# Patient Record
Sex: Female | Born: 1985 | Race: White | Hispanic: No | Marital: Single | Smoking: Never smoker
Health system: Southern US, Community
[De-identification: ages and names within clinical notes are randomized; demographics above are authoritative.]

## PROBLEM LIST (undated history)

## (undated) DIAGNOSIS — K589 Irritable bowel syndrome without diarrhea: Secondary | ICD-10-CM

## (undated) DIAGNOSIS — J45909 Unspecified asthma, uncomplicated: Secondary | ICD-10-CM

## (undated) DIAGNOSIS — F419 Anxiety disorder, unspecified: Secondary | ICD-10-CM

## (undated) DIAGNOSIS — G43909 Migraine, unspecified, not intractable, without status migrainosus: Secondary | ICD-10-CM

## (undated) DIAGNOSIS — Z8619 Personal history of other infectious and parasitic diseases: Secondary | ICD-10-CM

## (undated) HISTORY — DX: Anxiety disorder, unspecified: F41.9

## (undated) HISTORY — DX: Unspecified asthma, uncomplicated: J45.909

## (undated) HISTORY — DX: Irritable bowel syndrome, unspecified: K58.9

## (undated) HISTORY — DX: Personal history of other infectious and parasitic diseases: Z86.19

## (undated) HISTORY — DX: Migraine, unspecified, not intractable, without status migrainosus: G43.909

---
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
## 2017-09-03 ENCOUNTER — Telehealth: Payer: Self-pay | Admitting: Physician Assistant

## 2017-09-03 ENCOUNTER — Encounter: Payer: Self-pay | Admitting: Physician Assistant

## 2017-09-03 ENCOUNTER — Ambulatory Visit (INDEPENDENT_AMBULATORY_CARE_PROVIDER_SITE_OTHER): Payer: 59 | Admitting: Physician Assistant

## 2017-09-03 VITALS — BP 130/82 | HR 89 | Temp 98.6°F | Ht 68.25 in | Wt 252.5 lb

## 2017-09-03 DIAGNOSIS — Z23 Encounter for immunization: Secondary | ICD-10-CM | POA: Diagnosis not present

## 2017-09-03 DIAGNOSIS — Z114 Encounter for screening for human immunodeficiency virus [HIV]: Secondary | ICD-10-CM | POA: Diagnosis not present

## 2017-09-03 DIAGNOSIS — G43819 Other migraine, intractable, without status migrainosus: Secondary | ICD-10-CM | POA: Diagnosis not present

## 2017-09-03 LAB — COMPREHENSIVE METABOLIC PANEL
ALK PHOS: 95 U/L (ref 39–117)
ALT: 17 U/L (ref 0–35)
AST: 12 U/L (ref 0–37)
Albumin: 4.6 g/dL (ref 3.5–5.2)
BILIRUBIN TOTAL: 0.4 mg/dL (ref 0.2–1.2)
BUN: 6 mg/dL (ref 6–23)
CO2: 23 meq/L (ref 19–32)
Calcium: 10.1 mg/dL (ref 8.4–10.5)
Chloride: 107 mEq/L (ref 96–112)
Creatinine, Ser: 0.56 mg/dL (ref 0.40–1.20)
GFR: 136.11 mL/min (ref >60.00)
GLUCOSE: 84 mg/dL (ref 70–99)
POTASSIUM: 4.5 meq/L (ref 3.5–5.1)
SODIUM: 138 meq/L (ref 135–145)
TOTAL PROTEIN: 6.9 g/dL (ref 6.0–8.3)

## 2017-09-03 LAB — CBC WITH DIFFERENTIAL/PLATELET
Basophils Absolute: 0.1 10*3/uL (ref 0.0–0.1)
Basophils Relative: 0.5 % (ref 0.0–3.0)
Eosinophils Absolute: 0.1 10*3/uL (ref 0.0–0.7)
Eosinophils Relative: 0.9 % (ref 0.0–5.0)
HCT: 41.6 % (ref 36.0–46.0)
Hemoglobin: 13.4 g/dL (ref 12.0–15.0)
LYMPHS ABS: 4.1 10*3/uL — AB (ref 0.7–4.0)
Lymphocytes Relative: 34.7 % (ref 12.0–46.0)
MCHC: 32.2 g/dL (ref 30.0–36.0)
MCV: 88.3 fl (ref 78.0–100.0)
MONO ABS: 0.8 10*3/uL (ref 0.1–1.0)
Monocytes Relative: 6.5 % (ref 3.0–12.0)
NEUTROS PCT: 57.4 % (ref 43.0–77.0)
Neutro Abs: 6.7 10*3/uL (ref 1.4–7.7)
PLATELETS: 445 10*3/uL — AB (ref 150.0–400.0)
RBC: 4.71 Mil/uL (ref 3.87–5.11)
RDW: 13.4 % (ref 11.5–15.5)
WBC: 11.7 10*3/uL — ABNORMAL HIGH (ref 4.0–10.5)

## 2017-09-03 LAB — TSH: TSH: 2.45 u[IU]/mL (ref 0.35–4.50)

## 2017-09-03 MED ORDER — KETOROLAC TROMETHAMINE 10 MG PO TABS: 10.0000 mg | ORAL_TABLET | Freq: Four times a day (QID) | ORAL | 0 refills | Status: DC | PRN

## 2017-09-03 NOTE — Telephone Encounter (Signed)
Per referral documentation: [place] [facility] Called pt to schedule and pt refused appointment, wanted something sooner. Would you like [place] to try another office?

## 2017-09-03 NOTE — Progress Notes (Signed)
Miranda Hunter is a 31 y.o. female here to Establish Care and discuss chronic migraines.  I acted as a Neurosurgeon for [employer], PA-C Corky Mull, LPN  History of Present Illness:   Chief Complaint  Patient presents with  . Establish Care    [employer]  . Chronic Migraines    Acute Concerns: Migraines -- currently, she has been dealing with a migraine for 8 days. 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 Now having 3-4 clusters of headaches throughout the day. Typically gets 1 HA per month, prior to period. She is not sexually active.   Has taken Imitrex --> didn't work well for her, knocked her out Has taken Butalbital --> makes her very moody and temperamental Has never been on a daily medication to prevent migraines  When she was younger she tried to avoid medications -- per her mother's discretion. Worked on diet elimination but this was not helpful in determining cause of headaches. Headaches typically come and go around her period. Has been occurring since age 46. 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 [facility] -- did [facility]feedback without relief. Recommended to do, but did not do acupuncture. Her ob-gyn put her on depo-provera, and this has been helping, however she has been on this for a total of at least 3+ years and her ob-gyn is taking her off of it soon. Tried Nexplanon for two years but discontinued due to continuous headache and bleeding. Was also on birth control pills, but this didn't provide sufficient control.  Has never had any imaging of her head. Gets numbness/tingling in fingers in toes. Pain is consistently on L side this time, but typically on R side.   Health Maintenance: *Has been several years since last physical or any routine labwork per her report Weight -- Weight: 252 lb 8 oz (114.5 kg)    Depression screen PHQ 2/9 09/03/2017  Decreased Interest 0  Down, Depressed, Hopeless 0  PHQ  - 2 Score 0    No flowsheet data found.  Other providers/specialists: Huel Cote -- ob-gyn Kalman Jewels -- dentist  Past Medical History:  Diagnosis Date  . History of chicken pox   . Migraines      Social History   Social History  . Marital status: Single    Spouse name: N/A  . Number of children: N/A  . Years of education: N/A   Occupational History  . Not on file.   Social History Main Topics  . Smoking status: Never Smoker  . Smokeless tobacco: Never Used  . Alcohol use Yes     Comment: socially  . Drug use: No  . Sexual activity: Not Currently    Birth control/ protection: Injection   Other Topics Concern  . Not on file   Social History Narrative   She is a Psychiatric nurseSam"   Recently got a new puppy    Past Surgical History:  Procedure Laterality Date  . tonsillectomy and adnoidectomy Bilateral 2007    Family History  Problem Relation Age of Onset  . Prostate cancer Father   . Asthma Sister   . Lung cancer Maternal Grandmother        heavy smoker  . Lung cancer Maternal Grandfather        heavy smoker  . Asthma Sister     Allergies  Allergen Reactions  . Amoxicillin-Pot Clavulanate Diarrhea  Current Medications:   Current Outpatient Prescriptions:  .  Butalbital-APAP-Caffeine 50-300-40 MG CAPS, Take 1 tablet by mouth every 4 (four) hours as needed. , Disp: , Rfl:  .  cetirizine (ZYRTEC) 10 MG tablet, Take 10 mg by mouth daily., Disp: , Rfl:  .  Flaxseed, Linseed, (FLAX SEEDS PO), Take 1 tablet by mouth at bedtime., Disp: , Rfl:  .  medroxyPROGESTERone (DEPO-PROVERA) 150 MG/ML injection, Inject 150 mg into the muscle every 3 (three) months. , Disp: , Rfl:  .  Multiple Vitamin (MULTIVITAMIN) tablet, Take 1 tablet by mouth daily., Disp: , Rfl:  .  ondansetron (ZOFRAN) 8 MG tablet, Take 8 mg by mouth every 8 (eight) hours as needed. , Disp: , Rfl:  .  ketorolac (TORADOL) 10 MG tablet, Take 1 tablet (10 mg total) by mouth every 6  (six) hours as needed., Disp: 20 tablet, Rfl: 0   Review of Systems:   Review of Systems  Constitutional: Negative.   HENT: Negative.   Eyes: Positive for blurred vision.  Respiratory: Negative for cough and shortness of breath.   Cardiovascular: Negative.  Negative for chest pain, palpitations and leg swelling.  Gastrointestinal: Negative.   Genitourinary: Negative.   Musculoskeletal: Negative.   Skin: Negative.   Neurological: Positive for tingling and headaches. Negative for sensory change, speech change and focal weakness.       Numbness and tingling finger tips toes and tongue.  Endo/Heme/Allergies: Negative.   Psychiatric/Behavioral: Negative.     Vitals:   Vitals:   09/03/17 0852  BP: 130/82  Pulse: 89  Temp: 98.6 F (37 C)  TempSrc: Oral  SpO2: 96%  Weight: 252 lb 8 oz (114.5 kg)  Height: 5' 8.25" (1.734 m)     Body mass index is 38.11 kg/m.  Physical Exam:   Physical Exam  Constitutional: She appears well-developed. She is cooperative.  Non-toxic appearance. She does not have a sickly appearance. She does not appear ill. No distress.  HENT:  Head: Normocephalic and atraumatic.  Right Ear: Tympanic membrane, external ear and ear canal normal. Tympanic membrane is not erythematous, not retracted and not bulging.  Left Ear: Tympanic membrane, external ear and ear canal normal. Tympanic membrane is not erythematous, not retracted and not bulging.  Nose: Nose normal. Right sinus exhibits no maxillary sinus tenderness and no frontal sinus tenderness. Left sinus exhibits no maxillary sinus tenderness and no frontal sinus tenderness.  Mouth/Throat: Uvula is midline. No posterior oropharyngeal edema or posterior oropharyngeal erythema.  Eyes: Conjunctivae and lids are normal.  Neck: Trachea normal.  Cardiovascular: Normal rate, regular rhythm, S1 normal, S2 normal and normal heart sounds.   Pulmonary/Chest: Effort normal and breath sounds normal. She has no decreased  breath sounds. She has no wheezes. She has no rhonchi. She has no rales.  Lymphadenopathy:    She has no cervical adenopathy.  Neurological: She is alert. She has normal strength and normal reflexes. No cranial nerve deficit or sensory deficit. Coordination and gait normal. GCS eye subscore is 4. GCS verbal subscore is 5. GCS motor subscore is 6.  Reflex Scores:      Patellar reflexes are 2+ on the right side and 2+ on the left side. Normal finger-to-nose exam and rapid alternating movements  Skin: Skin is warm, dry and intact.  Psychiatric: She has a normal mood and affect. Her speech is normal and behavior is normal.  Nursing note and vitals reviewed.   Assessment and Plan:    Jacinta was seen  today for establish care and chronic migraines.  Diagnoses and all orders for this visit:  Other migraine without status migrainosus, intractable Discussed case with Dr. Helane Rima. Given complex history of uncontrolled migraines x at least 1 decade, will send to neurology for further evaluation. Imaging management per neuro's discretion. Will obtain labs. Provided prn Toradol tablets for patient to take. Reviewed stroke precautions and red flags that would warrant ER evaluation. No red flags on today's exam. Patient verbalized understanding. -     CBC with Differential/Platelet -     Comprehensive metabolic panel -     TSH -     Ambulatory referral to Neurology  Need for prophylactic vaccination and inoculation against influenza -     Flu Vaccine QUAD 36+ mos IM  Encounter for screening for HIV -     HIV antibody  Other orders -     ketorolac (TORADOL) 10 MG tablet; Take 1 tablet (10 mg total) by mouth every 6 (six) hours as needed.    . Reviewed expectations re: course of current medical issues. . Discussed self-management of symptoms. . Outlined signs and symptoms indicating need for more acute intervention. . Patient verbalized understanding and all questions were  answered. . See orders for this visit as documented in the electronic medical record. . Patient received an After-Visit Summary.   CMA or LPN served as scribe during this visit. History, Physical, and Plan performed by medical provider. Documentation and orders reviewed and attested to.   Jarold Motto, PA-C

## 2017-09-03 NOTE — Patient Instructions (Signed)
It was great to meet you!  You may take the Toradol pills to help with your pain. You may start taking this evening.  We are referring you to neurology for further evaluation, I would like for them to determine if you need any imaging (MRI, etc.)  We will call you with your lab results.   Contact a health care provider if:  Your symptoms are not helped by medicine.  You have a headache that is different from the usual headache.  You have nausea or you vomit.  You have a fever.  Get help right away if:  Your headache becomes severe.  You have repeated vomiting.  You have a stiff neck.  You have a loss of vision.  You have problems with speech.  You have pain in the eye or ear.  You have muscular weakness or loss of muscle control.  You lose your balance or have trouble walking.  You feel faint or pass out.  You have confusion.  This information is not intended to replace advice given to you by your health care provider. Make sure you discuss any questions you have with your health care provider.

## 2017-09-03 NOTE — Telephone Encounter (Signed)
[facility]  Patient has requested this.  Thanks for all of your help.  Jarold Motto, PA-C

## 2017-09-03 NOTE — Telephone Encounter (Signed)
Patient calling to see if referral can sent to [facility].  Please call back once sent.  Ty,  -LL

## 2017-09-03 NOTE — Telephone Encounter (Signed)
Can we try [facility]?  Jarold Motto PA-C, 09/03/17

## 2017-09-04 LAB — HIV ANTIBODY (ROUTINE TESTING W REFLEX): HIV: NONREACTIVE

## 2017-09-11 ENCOUNTER — Telehealth: Payer: Self-pay | Admitting: Physician Assistant

## 2017-09-11 NOTE — Telephone Encounter (Signed)
ROI faxed to [facility]  °

## 2017-11-03 ENCOUNTER — Telehealth: Payer: Self-pay | Admitting: *Deleted

## 2017-11-03 NOTE — Telephone Encounter (Signed)
Called and spoke with patient. She verbalized understanding that office will be closed tomorrow, Monday 12/10 and we will back ASAP to reschedule appointment.

## 2017-11-04 ENCOUNTER — Ambulatory Visit: Payer: 59 | Admitting: Neurology

## 2017-11-06 NOTE — Telephone Encounter (Signed)
Called and spoke with patient. I rescheduled her for this coming Friday 11/08/17 @ 3:30 with an arrival time of 3:00. She verbalized understanding and appreciation.

## 2017-11-07 NOTE — Progress Notes (Signed)
[facility]    Provider:  Dr Lucia Gaskins Referring Provider: Jarold Motto, PA Primary Care Physician:  Jarold Motto, PA  CC:  Migraines  HPI:  Miranda Hunter is a 31 y.o. female here as a referral from Dr. Bufford Buttner for migraines. Mother is here and provides information. She started having migraines in HS and then worsened as a freshman in college. [facility] and [facility]. No known triggers. Managing her menstruation helped. In October her migraine lasted 9 days. She was vomiting, was so painful, 10/10 pain and this was her tipping point. She has had no auras but also has an aura, starts on the left side, throbbing, pulsating, light/sound/smell sensitivity, dark room better, movement makes it worse, her whole body hurts. Extensive family history of migraines in mother, grandmother. She has numbness in the toes and tongue, no weakness. Every 3 months she gets a migraine and it is predictable. No other focal neurologic deficits, associated symptoms, inciting events or modifiable factors.  Meds tried: propranolol, Topiramate, nortriptyline  Reviewed notes, labs and imaging from outside physicians, which showed:  Reviewed referring physician's notes, patient was last seen in October 2018 and she had been dealing with a migraine for 8 days, 3-4 clusters of migraines throughout the day, in the past she typically would get one headache per month prior to.,  Not sexually active, Imitrex did not work and gave her side effects, butalbital makes her very moody and temperamental, has never been on a preventative medication.  In the past she worked on diet elimination but this was not helpful, headaches would typically come and go around her.  Periods started at the age of 53, worsened as a freshman in college.  [facility] did [facility] without relief.  Did not do acupuncture.  Her OB/GYN put her on Depo-Provera  which has helped for the last 3 years.  Tried Nexplanon for 2 years but discontinued due to continuous headache and bleeding.  Never had imaging of her brain.  TSH, CMP, HIV, CBC all unremarkable only abnormality with slightly elevated white blood cells 11.7.  And elevated platelets 445.  Review of Systems: Patient complains of symptoms per HPI as well as the following symptoms: headache. Pertinent negatives and positives per HPI. All others negative.   Social History   Socioeconomic History  . Marital status: Single    Spouse name: Not on file  . Number of children: 0  . Years of education: Not on file  . Highest education level: Bachelor's degree (e.g., BA, AB, BS)  Social Needs  . Financial resource strain: Not on file  . Food insecurity - worry: Not on file  . Food insecurity - inability: Not on file  . Transportation needs - medical: Not on file  . Transportation needs - non-medical: Not on file  Occupational History  . Not on file  Tobacco Use  . Smoking status: Never Smoker  . Smokeless tobacco: Never Used  Substance and Sexual Activity  . Alcohol use: Yes    Comment: socially  . Drug use: No  . Sexual activity: Not Currently    Birth control/protection: Injection  Other Topics Concern  . Not on file  Social History Narrative   She is a Psychiatric nurseSam"   Recently got a new puppy- a golden retriever named [name]   Caffeine once a week       Family History  Problem Relation Age of  Onset  . Prostate cancer Father   . Asthma Sister   . Lung cancer Maternal Grandmother        heavy smoker  . Lung cancer Maternal Grandfather        heavy smoker  . Asthma Sister     Past Medical History:  Diagnosis Date  . History of chicken pox   . Migraines     Past Surgical History:  Procedure Laterality Date  . tonsillectomy and adnoidectomy Bilateral 2007    Current Outpatient Medications  Medication Sig Dispense Refill  . Butalbital-APAP-Caffeine 50-300-40 MG  CAPS Take 1 tablet by mouth every 4 (four) hours as needed.     . cetirizine (ZYRTEC) 10 MG tablet Take 10 mg by mouth daily.    . Flaxseed, Linseed, (FLAX SEEDS PO) Take 1 tablet by mouth at bedtime.    Marland Kitchen. ketorolac (TORADOL) 10 MG tablet Take 1 tablet (10 mg total) by mouth every 6 (six) hours as needed. 20 tablet 11  . medroxyPROGESTERone (DEPO-PROVERA) 150 MG/ML injection Inject 150 mg into the muscle every 3 (three) months.     . Multiple Vitamin (MULTIVITAMIN) tablet Take 1 tablet by mouth daily.    . ondansetron (ZOFRAN) 8 MG tablet Take 8 mg by mouth every 8 (eight) hours as needed.     . Diclofenac Potassium 50 MG PACK Take 50 mg by mouth once as needed. Take once daily as needed with headache onset. Please take with food 9 each 0  . frovatriptan (FROVA) 2.5 MG tablet Take 1 tablet (2.5 mg total) by mouth as needed for migraine. If recurs, may repeat after 2 hours. Max of 2 tabs in 24 hours. 10 tablet 11  . ondansetron (ZOFRAN-ODT) 4 MG disintegrating tablet Take 1 tablet (4 mg total) by mouth every 8 (eight) hours as needed for nausea. 60 tablet 11   No current facility-administered medications for this visit.     Allergies as of 11/08/2017 - Review Complete 11/08/2017  Allergen Reaction Noted  . Amoxicillin-pot clavulanate Diarrhea 12/04/2016    Vitals: BP 134/90 (BP Location: Right Arm, Patient Position: Sitting)   Pulse (!) 111   Ht 5\' 9"  (1.753 m)   Wt 254 lb 9.6 oz (115.5 kg)   BMI 37.60 kg/m  Last Weight:  Wt Readings from Last 1 Encounters:  11/08/17 254 lb 9.6 oz (115.5 kg)   Last Height:   Ht Readings from Last 1 Encounters:  11/08/17 5\' 9"  (1.753 m)   Physical exam: Exam: Gen: NAD, conversant, well nourised, obese, well groomed                     CV: RRR, no MRG. No Carotid Bruits. No peripheral edema, warm, nontender Eyes: Conjunctivae clear without exudates or hemorrhage  Neuro: Detailed Neurologic Exam  Speech:    Speech is normal; fluent and  spontaneous with normal comprehension.  Cognition:    The patient is oriented to person, place, and time;     recent and remote memory intact;     language fluent;     normal attention, concentration,     fund of knowledge Cranial Nerves:    The pupils are equal, round, and reactive to light. The fundi are normal and spontaneous venous pulsations are present. Visual fields are full to finger confrontation. Extraocular movements are intact. Trigeminal sensation is intact and the muscles of mastication are normal. The face is symmetric. The palate elevates in the midline. Hearing intact. Voice is  normal. Shoulder shrug is normal. The tongue has normal motion without fasciculations.   Coordination:    Normal finger to nose and heel to shin. Normal rapid alternating movements.   Gait:    Heel-toe and tandem gait are normal.   Motor Observation:    No asymmetry, no atrophy, and no involuntary movements noted. Tone:    Normal muscle tone.    Posture:    Posture is normal. normal erect    Strength:    Strength is V/V in the upper and lower limbs.      Sensation: intact to LT     Reflex Exam:  DTR's:    Deep tendon reflexes in the upper and lower extremities are normal bilaterally.   Toes:    The toes are downgoing bilaterally.   Clonus:    Clonus is absent.    Assessment/Plan:  31 year old with predictable migraine. Discussed acute medication management.   At onset of migraine for acute management: Cambia or Toradol and may try Frova For prevention if migraine is predictable: Frova within 12-24 hours of migraines  Discussed: To prevent or relieve headaches, try the following: Cool Compress. Lie down and place a cool compress on your head.  Avoid headache triggers. If certain foods or odors seem to have triggered your migraines in the past, avoid them. A headache diary might help you identify triggers.  Include physical activity in your daily routine. Try a daily walk or other  moderate aerobic exercise.  Manage stress. Find healthy ways to cope with the stressors, such as delegating tasks on your to-do list.  Practice relaxation techniques. Try deep breathing, yoga, massage and visualization.  Eat regularly. Eating regularly scheduled meals and maintaining a healthy diet might help prevent headaches. Also, drink plenty of fluids.  Follow a regular sleep schedule. Sleep deprivation might contribute to headaches Consider biofeedback. With this mind-body technique, you learn to control certain bodily functions - such as muscle tension, heart rate and blood pressure - to prevent headaches or reduce headache pain.    Proceed to emergency room if you experience new or worsening symptoms or symptoms do not resolve, if you have new neurologic symptoms or if headache is severe, or for any concerning symptom.   Provided education and documentation from American headache Society toolbox including articles on: chronic migraine medication overuse headache, chronic migraines, prevention of migraines, behavioral and other nonpharmacologic treatments for headache.  Cc: Jarold MottoWorley, [place], [place]   Naomie DeanAntonia Leta Bucklin, MD  [facility] [address] [place], [place] [id number]  Phone [phone] Fax [phone]

## 2017-11-08 ENCOUNTER — Encounter: Payer: Self-pay | Admitting: Neurology

## 2017-11-08 ENCOUNTER — Ambulatory Visit: Payer: 59 | Admitting: Neurology

## 2017-11-08 VITALS — BP 134/90 | HR 111 | Ht 69.0 in | Wt 254.6 lb

## 2017-11-08 DIAGNOSIS — G43109 Migraine with aura, not intractable, without status migrainosus: Secondary | ICD-10-CM | POA: Diagnosis not present

## 2017-11-08 MED ORDER — FROVATRIPTAN SUCCINATE 2.5 MG PO TABS: 2.5000 mg | ORAL_TABLET | ORAL | 11 refills | Status: DC | PRN

## 2017-11-08 MED ORDER — DICLOFENAC POTASSIUM(MIGRAINE) 50 MG PO PACK: 50.0000 mg | PACK | Freq: Once | ORAL | 0 refills | Status: DC | PRN

## 2017-11-08 MED ORDER — KETOROLAC TROMETHAMINE 10 MG PO TABS: 10.0000 mg | ORAL_TABLET | Freq: Four times a day (QID) | ORAL | 11 refills | Status: DC | PRN

## 2017-11-08 MED ORDER — ONDANSETRON 4 MG PO TBDP: 4.0000 mg | ORAL_TABLET | Freq: Three times a day (TID) | ORAL | 11 refills | Status: DC | PRN

## 2017-11-08 NOTE — Patient Instructions (Addendum)
Magnesium citrate 400mg  to 600mg  daily, riboflavin 400mg  daily, Coenzyme Q 10 100mg  three times daily Consider joining Facebook group [facility]. Look up Aimovig or Ajovy (CGRP medications)  Frovatriptan: 1-2 times daily in anticipation of migraine dures menses, may take with zofran and Cambia or other NSAID Ondansetron prn for nausea or migraine Can take Frova with NSAID and Zofran   Frovatriptan tablets What is this medicine? FROVATRIPTAN (froe va TRIP tan) is used to treat migraines with or without aura. An aura is a strange feeling or visual disturbance that warns you of an attack. It is not used to prevent migraines. This medicine may be used for other purposes; ask your health care provider or pharmacist if you have questions. COMMON BRAND NAME(S): Frova What should I tell my health care provider before I take this medicine? They need to know if you have any of these conditions: -bowel disease or colitis -diabetes -family history of heart disease -fast or irregular heart beat -heart or blood vessel disease, angina (chest pain), or previous heart attack -high blood pressure -high cholesterol -history of stroke, transient ischemic attacks (TIAs or mini-strokes), or intracranial bleeding -kidney or liver disease -overweight -poor circulation -postmenopausal or surgical removal of uterus and ovaries -Raynaud's disease -seizure disorder -an unusual or allergic reaction to frovatriptan, other medicines, foods, dyes, or preservatives -pregnant or trying to get pregnant -breast-feeding How should I use this medicine? Take this medicine by mouth with a glass of water. Follow the directions on the prescription label. This medicine is taken at the first symptoms of a migraine. It is not for everyday use. If your migraine headache returns after one dose, you can take another dose as directed. You must allow at least 2 hours between doses, and do not take more than 3  tablets (7.5 mg) in 24 hours. If there is no improvement at all after the first dose, do not take a second dose without talking to your doctor or health care professional. Do not take your medicine more often than directed. Talk to your pediatrician regarding the use of this medicine in children. Special care may be needed. Overdosage: If you think you have taken too much of this medicine contact a poison control center or emergency room at once. NOTE: This medicine is only for you. Do not share this medicine with others. What if I miss a dose? This does not apply; this medicine is not for regular use. What may interact with this medicine? Do not take this medicine with any of the following medicines: -amphetamine, dextroamphetamine or cocaine -dihydroergotamine, ergotamine, ergoloid mesylates, methysergide, or ergot-type medication - do not take within 24 hours of taking frovatriptan -feverfew -MAOIs like Carbex, Eldepryl, Marplan, Nardil, and Parnate - do not take frovatriptan within 2 weeks of stopping MAOI therapy -other migraine medicines like almotriptan, eletriptan, naratriptan, rizatriptan, zolmitriptan - do not take within 24 hours of taking frovatriptan -tryptophan This medicine may also interact with the following medications: -female hormones, like estrogens or progestins and birth control pills -medicines for mental depression, anxiety or mood problems -propranolol This list may not describe all possible interactions. Give your health care provider a list of all the medicines, herbs, non-prescription drugs, or dietary supplements you use. Also tell them if you smoke, drink alcohol, or use illegal drugs. Some items may interact with your medicine. What should I watch for while using this medicine? Only take this medicine for a migraine headache. Take it if you get warning symptoms or  at the start of a migraine attack. It is not for regular use to prevent migraine attacks. You may get  drowsy or dizzy. Do not drive, use machinery, or do anything that needs mental alertness until you know how this medicine affects you. To reduce dizzy or fainting spells, do not sit or stand up quickly, especially if you are an older patient. Alcohol can increase drowsiness, dizziness and flushing. Avoid alcoholic drinks. Smoking cigarettes may increase the risk of heart-related side effects from using this medicine. If you take migraine medicines for 10 or more days a month, your migraines may get worse. Keep a diary of headache days and medicine use. Contact your healthcare professional if your migraine attacks occur more frequently. What side effects may I notice from receiving this medicine? Side effects that you should report to your doctor or health care professional as soon as possible: -allergic reactions like skin rash, itching or hives, swelling of the face, lips, or tongue -chest or throat pain, tightness -fast, slow, or irregular heart beat -increased or decreased blood pressure -loss of vision or vision changes -seizures -severe stomach pain and cramping, bloody diarrhea -shortness of breath, wheezing, or difficulty breathing -tingling, pain, or numbness in the face, hands or feet Side effects that usually do not require medical attention (report to your doctor or health care professional if they continue or are bothersome): -drowsiness -feeling warm, flushing, or redness of the face -headache -muscle pain or cramps -nausea, vomiting, diarrhea or stomach upset -tiredness or weakness This list may not describe all possible side effects. Call your doctor for medical advice about side effects. You may report side effects to FDA at 1-800-FDA-1088. Where should I keep my medicine? Keep out of the reach of children. Store at room temperature between 20 and 25 degrees C (68 and 77 degrees F). Protect from light and moisture. Throw away any unused medicine after the expiration  date. NOTE: This sheet is a summary. It may not cover all possible information. If you have questions about this medicine, talk to your doctor, pharmacist, or health care provider.  2018 Elsevier/Gold Standard (2013-07-14 10:06:08)

## 2018-02-19 DIAGNOSIS — J029 Acute pharyngitis, unspecified: Secondary | ICD-10-CM | POA: Diagnosis not present

## 2018-03-03 ENCOUNTER — Telehealth: Payer: Self-pay | Admitting: Neurology

## 2018-03-03 MED ORDER — METHYLPREDNISOLONE 4 MG PO TBPK: ORAL_TABLET | ORAL | 0 refills | Status: DC

## 2018-03-03 NOTE — Telephone Encounter (Addendum)
Per Dr. Lucia GaskinsAhern, [facility] but do not take Cambia and Toradol more than a total of 3 times per day.

## 2018-03-03 NOTE — Telephone Encounter (Signed)
Spoke with patient. Discussed Dr. Trevor MaceAhern's instructions not to take Toradol and Cambia more than a total of 3 times a day between the two medications. Ex. Pt took Toradol at 6 AM today, may take Cambia this afternoon then repeat Toradol tonight if needed. She will not take both at the same time but will alternate. Discussed a steroid dose pack to help break this headache cycle. She verbalized appreciation. She is aware that the Medrol dose pack is tapering with instructions, 21 tablets over about a week. Take all doses with food. She verbalized understanding. She stated she is currently on Amoxicillin. Patient will call back if this treatment plan does not work for her.

## 2018-03-03 NOTE — Addendum Note (Signed)
Addended by: Bertram SavinULBERTSON, BETHANY L on: 03/03/2018 03:32 PM   Modules accepted: Orders

## 2018-03-03 NOTE — Telephone Encounter (Addendum)
Per Lucia GaskinsAhern, pt can have an infusion if severe, painful, vomiting.  Spoke with patient. She stated that she is on day 6, has been taking Toradol, keeps it at bay but then "crashes back down", hits her at night. Migraine will come back and then she feels like she is ok then a couple hours later it hits her again. It was bad this morning, she vomited right when she woke up. She took another Toradol around 6 AM and now she is at work. She is ok right now, but is concerned that it's just a matter of time before it comes back.   She stated she has forgotten about the Cambia and she will try that today. She does not think she has taken Frovatriptan but she thinks it was too expensive. She is aware that both Toradol and Cambia are NSAIDs and RN advised not to take both together. She will alternate. She is aware the recommendation is not take migraine meds more than 2x per week to avoid rebound headaches. Pt verbalized understanding and appreciation.

## 2018-03-03 NOTE — Telephone Encounter (Signed)
Pt requesting a call back to discuss moving forward with an infusion, stating she is on day 6 of a migraine. Pt would like to know more about our infusion suit to see if its a good fit for her.

## 2018-03-03 NOTE — Telephone Encounter (Signed)
Could also try medrol taper

## 2018-07-23 DIAGNOSIS — Z01419 Encounter for gynecological examination (general) (routine) without abnormal findings: Secondary | ICD-10-CM | POA: Diagnosis not present

## 2018-07-23 DIAGNOSIS — Z6839 Body mass index (BMI) 39.0-39.9, adult: Secondary | ICD-10-CM | POA: Diagnosis not present

## 2018-07-23 DIAGNOSIS — Z124 Encounter for screening for malignant neoplasm of cervix: Secondary | ICD-10-CM | POA: Diagnosis not present

## 2018-07-24 DIAGNOSIS — Z124 Encounter for screening for malignant neoplasm of cervix: Secondary | ICD-10-CM | POA: Diagnosis not present

## 2018-07-24 LAB — HM PAP SMEAR

## 2018-08-25 ENCOUNTER — Ambulatory Visit: Payer: 59 | Admitting: Physician Assistant

## 2018-09-01 ENCOUNTER — Encounter: Payer: Self-pay | Admitting: Physician Assistant

## 2018-09-01 ENCOUNTER — Ambulatory Visit (INDEPENDENT_AMBULATORY_CARE_PROVIDER_SITE_OTHER): Payer: 59 | Admitting: Physician Assistant

## 2018-09-01 VITALS — BP 120/72 | HR 107 | Temp 98.5°F | Ht 69.0 in | Wt 256.5 lb

## 2018-09-01 DIAGNOSIS — Z23 Encounter for immunization: Secondary | ICD-10-CM

## 2018-09-01 DIAGNOSIS — J4520 Mild intermittent asthma, uncomplicated: Secondary | ICD-10-CM | POA: Diagnosis not present

## 2018-09-01 DIAGNOSIS — F988 Other specified behavioral and emotional disorders with onset usually occurring in childhood and adolescence: Secondary | ICD-10-CM

## 2018-09-01 DIAGNOSIS — J069 Acute upper respiratory infection, unspecified: Secondary | ICD-10-CM

## 2018-09-01 MED ORDER — ALBUTEROL SULFATE HFA 108 (90 BASE) MCG/ACT IN AERS: 2.0000 | INHALATION_SPRAY | Freq: Four times a day (QID) | RESPIRATORY_TRACT | 2 refills | Status: DC | PRN

## 2018-09-01 MED ORDER — DOXYCYCLINE HYCLATE 100 MG PO TABS: 100.0000 mg | ORAL_TABLET | Freq: Two times a day (BID) | ORAL | 0 refills | Status: DC

## 2018-09-01 MED ORDER — BUDESONIDE-FORMOTEROL FUMARATE 160-4.5 MCG/ACT IN AERO
2.0000 | INHALATION_SPRAY | Freq: Two times a day (BID) | RESPIRATORY_TRACT | 3 refills | Status: DC
Start: 2018-09-01 — End: 2018-12-03

## 2018-09-01 NOTE — Progress Notes (Signed)
Miranda Hunter is a 32 y.o. female is here to discuss: Sinus problem, ADD and medication refill.  I acted as a Neurosurgeon for [employer], PA-C Miranda Mull, LPN  History of Present Illness:   Chief Complaint  Patient presents with  . Medication Refill  . Discuss ADD  . Sinus Problem    Sinus Problem  This is a new problem. Episode onset: Started on Friday. The problem has been gradually worsening since onset. There has been no fever. Her pain is at a severity of 7/10. The pain is moderate. Associated symptoms include coughing (expectorating green sputum), ear pain (yesterday), sinus pressure and sneezing. Pertinent negatives include no sore throat. (Post nasal drip) Past treatments include oral decongestants and spray decongestants. The treatment provided no relief.   No sick contacts that she is aware of.  Asthma Diagnosed a while ago. Uses albuterol inhaler twice a week when she has occasional wheezing. Has never been on another prescription inhaler. Denies recent increase in SOB, stridor, chest pain. Feels like she has symptoms exacerbated by allergies.   ADD In elementary school, started Adderall for ADD. Transitioned off of it in 7th grade. 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 Has tried essential oils, using headphones to block noises out and other techniques without success. Does note some increased anxiety.  Patient's last menstrual period was 07/06/2018.     There are no preventive care reminders to display for this patient.  Past Medical History:  Diagnosis Date  . History of chicken pox   . Migraines      Social History   Socioeconomic History  . Marital status: Single    Spouse name: Not on file  . Number of children: 0  . Years of education: Not on file  . Highest  education level: Bachelor's degree (e.g., BA, AB, BS)  Occupational History  . Not on file  Social Needs  . Financial resource strain: Not on file  . Food insecurity:    Worry: Not on file    Inability: Not on file  . Transportation needs:    Medical: Not on file    Non-medical: Not on file  Tobacco Use  . Smoking status: Never Smoker  . Smokeless tobacco: Never Used  Substance and Sexual Activity  . Alcohol use: Yes    Comment: socially  . Drug use: No  . Sexual activity: Not Currently    Birth control/protection: Injection  Lifestyle  . Physical activity:    Days per week: Not on file    Minutes per session: Not on file  . Stress: Not on file  Relationships  . Social connections:    Talks on phone: Not on file    Gets together: Not on file    Attends religious service: Not on file    Active member of club or organization: Not on file    Attends meetings of clubs or organizations: Not on file    Relationship status: Not on file  . Intimate partner violence:    Fear of current or ex partner: Not on file    Emotionally abused: Not on file    Physically abused: Not on file    Forced sexual activity: Not on file  Other Topics Concern  . Not on file  Social History Narrative  She is a Psychiatric nurseSam"   Recently got a new puppy- a golden retriever named Miranda Hunter   Caffeine once a week       Past Surgical History:  Procedure Laterality Date  . tonsillectomy and adnoidectomy Bilateral 2007    Family History  Problem Relation Age of Onset  . Prostate cancer Father   . Asthma Sister   . Lung cancer Maternal Grandmother        heavy smoker  . Lung cancer Maternal Grandfather        heavy smoker  . Asthma Sister     PMHx, SurgHx, SocialHx, FamHx, Medications, and Allergies were reviewed in the Visit Navigator and updated as appropriate.   There are no active problems to display for this patient.   Social History   Tobacco Use  . Smoking status: Never  Smoker  . Smokeless tobacco: Never Used  Substance Use Topics  . Alcohol use: Yes    Comment: socially  . Drug use: No    Current Medications and Allergies:    Current Outpatient Medications:  .  Butalbital-APAP-Caffeine 50-300-40 MG CAPS, Take 1 tablet by mouth every 4 (four) hours as needed. , Disp: , Rfl:  .  cetirizine (ZYRTEC) 10 MG tablet, Take 10 mg by mouth daily., Disp: , Rfl:  .  Diclofenac Potassium 50 MG PACK, Take 50 mg by mouth once as needed. Take once daily as needed with headache onset. Please take with food, Disp: 9 each, Rfl: 0 .  Flaxseed, Linseed, (FLAX SEEDS PO), Take 1 tablet by mouth at bedtime., Disp: , Rfl:  .  frovatriptan (FROVA) 2.5 MG tablet, Take 1 tablet (2.5 mg total) by mouth as needed for migraine. If recurs, may repeat after 2 hours. Max of 2 tabs in 24 hours., Disp: 10 tablet, Rfl: 11 .  ketorolac (TORADOL) 10 MG tablet, Take 1 tablet (10 mg total) by mouth every 6 (six) hours as needed., Disp: 20 tablet, Rfl: 11 .  medroxyPROGESTERone (DEPO-PROVERA) 150 MG/ML injection, Inject 150 mg into the muscle every 3 (three) months. , Disp: , Rfl:  .  Multiple Vitamin (MULTIVITAMIN) tablet, Take 1 tablet by mouth daily., Disp: , Rfl:  .  ondansetron (ZOFRAN) 8 MG tablet, Take 8 mg by mouth every 8 (eight) hours as needed. , Disp: , Rfl:  .  ondansetron (ZOFRAN-ODT) 4 MG disintegrating tablet, Take 1 tablet (4 mg total) by mouth every 8 (eight) hours as needed for nausea., Disp: 60 tablet, Rfl: 11 .  albuterol (PROVENTIL HFA;VENTOLIN HFA) 108 (90 Base) MCG/ACT inhaler, Inhale 2 puffs into the lungs every 6 (six) hours as needed for wheezing or shortness of breath., Disp: 1 Inhaler, Rfl: 2 .  budesonide-formoterol (SYMBICORT) 160-4.5 MCG/ACT inhaler, Inhale 2 puffs into the lungs 2 (two) times daily., Disp: 1 Inhaler, Rfl: 3 .  doxycycline (VIBRA-TABS) 100 MG tablet, Take 1 tablet (100 mg total) by mouth 2 (two) times daily., Disp: 20 tablet, Rfl: 0   Allergies    Allergen Reactions  . Amoxicillin-Pot Clavulanate Diarrhea    Review of Systems   Review of Systems  HENT: Positive for ear pain (yesterday), sinus pressure and sneezing. Negative for sore throat.   Respiratory: Positive for cough (expectorating green sputum).     Vitals:   Vitals:   09/01/18 0930  BP: 120/72  Pulse: (!) 107  Temp: 98.5 F (36.9 C)  TempSrc: Oral  SpO2: 96%  Weight: 256 lb 8 oz (116.3 kg)  Height:  5\' 9"  (1.753 m)     Body mass index is 37.88 kg/m.   Physical Exam:    Physical Exam  Constitutional: She appears well-developed. She is cooperative.  Non-toxic appearance. She does not have a sickly appearance. She does not appear ill. No distress.  HENT:  Head: Normocephalic and atraumatic.  Right Ear: Tympanic membrane, external ear and ear canal normal. Tympanic membrane is not erythematous, not retracted and not bulging.  Left Ear: Tympanic membrane, external ear and ear canal normal. Tympanic membrane is not erythematous, not retracted and not bulging.  Nose: Mucosal edema and rhinorrhea present. Right sinus exhibits maxillary sinus tenderness. Right sinus exhibits no frontal sinus tenderness. Left sinus exhibits maxillary sinus tenderness. Left sinus exhibits no frontal sinus tenderness.  Mouth/Throat: Uvula is midline. Posterior oropharyngeal erythema present. No posterior oropharyngeal edema.  Eyes: Conjunctivae and lids are normal.  Neck: Trachea normal.  Cardiovascular: Normal rate, regular rhythm, S1 normal, S2 normal and normal heart sounds.  Pulmonary/Chest: Effort normal and breath sounds normal. She has no decreased breath sounds. She has no wheezes. She has no rhonchi. She has no rales.  Good breath sounds bilaterally  Lymphadenopathy:    She has no cervical adenopathy.  Neurological: She is alert.  Skin: Skin is warm, dry and intact.  Psychiatric: She has a normal mood and affect. Her speech is normal and behavior is normal.  Nursing note  and vitals reviewed.    Assessment and Plan:    Alisyn was seen today for medication refill, discuss add and sinus problem.  Diagnoses and all orders for this visit:  Upper respiratory tract infection, unspecified type No red flags on exam.  Continue flonase and zyrtec. Did provide safety net prescription of Doxycycline for her to start if symptoms do not improve by the end of the week. Push fluids. Discussed taking medications as prescribed. Reviewed return precautions including worsening fever, SOB, worsening cough or other concerns. Push fluids and rest. I recommend that patient follow-up if symptoms worsen or persist despite treatment x 7-10 days, sooner if needed.  Need for prophylactic vaccination and inoculation against influenza -     Flu Vaccine QUAD 36+ mos IM  Intermittent asthma without complication, unspecified asthma severity Overall well controlled. Continue albuterol inhaler. I did recommend that should she begin to require albuterol more frequently, to start Symbicort in AM and PM. May also use should her her URI worsen. Follow-up if symptoms worsen or persist despite treatment.  Attention deficit disorder, unspecified hyperactivity presence Provided her with contact information for [facility]. She is agreeable to reaching out to them on her own.  Other orders -     doxycycline (VIBRA-TABS) 100 MG tablet; Take 1 tablet (100 mg total) by mouth 2 (two) times daily. -     budesonide-formoterol (SYMBICORT) 160-4.5 MCG/ACT inhaler; Inhale 2 puffs into the lungs 2 (two) times daily. -     albuterol (PROVENTIL HFA;VENTOLIN HFA) 108 (90 Base) MCG/ACT inhaler; Inhale 2 puffs into the lungs every 6 (six) hours as needed for wheezing or shortness of breath.  . Reviewed expectations re: course of current medical issues. . Discussed self-management of symptoms. . Outlined signs and symptoms indicating need for more acute intervention. . Patient verbalized  understanding and all questions were answered. . See orders for this visit as documented in the electronic medical record. . Patient received an After Visit Summary.  CMA or LPN served as scribe during this visit. History, Physical, and Plan performed by  medical provider. The above documentation has been reviewed and is accurate and complete.  Jarold Motto, PA-C [place] [place] 09/01/2018  Follow-up: No follow-ups on file.

## 2018-09-01 NOTE — Patient Instructions (Addendum)
It was great to see you!  Continue your flonase and zyrtec. If symptoms do not improve, please start Doxycycline oral antibiotic and take as directed.  If you require increased use of your Albuterol inhaler, start the Symbicort inhaler and schedule use of it. Follow-up with [place] if this does not improve symptoms.  Please contact [facility] for an appointment for ADD evaluation.  Push fluids and get plenty of rest. Please return if you are not improving as expected, or if you have high fevers (>101.5) or difficulty swallowing or worsening productive cough.  Call clinic with questions.  I hope you start feeling better soon!

## 2018-09-16 ENCOUNTER — Encounter: Payer: Self-pay | Admitting: Physician Assistant

## 2018-09-16 ENCOUNTER — Ambulatory Visit (INDEPENDENT_AMBULATORY_CARE_PROVIDER_SITE_OTHER): Payer: 59 | Admitting: Physician Assistant

## 2018-09-16 VITALS — BP 118/78 | HR 115 | Temp 98.4°F | Ht 69.0 in | Wt 256.4 lb

## 2018-09-16 DIAGNOSIS — R059 Cough, unspecified: Secondary | ICD-10-CM

## 2018-09-16 DIAGNOSIS — R05 Cough: Secondary | ICD-10-CM | POA: Diagnosis not present

## 2018-09-16 DIAGNOSIS — J4540 Moderate persistent asthma, uncomplicated: Secondary | ICD-10-CM | POA: Diagnosis not present

## 2018-09-16 LAB — POCT INFLUENZA A/B
INFLUENZA A, POC: NEGATIVE
Influenza B, POC: NEGATIVE

## 2018-09-16 MED ORDER — GUAIFENESIN-CODEINE 100-10 MG/5ML PO SYRP: 10.0000 mL | ORAL_SOLUTION | Freq: Every evening | ORAL | 0 refills | Status: DC | PRN

## 2018-09-16 MED ORDER — PREDNISONE 20 MG PO TABS: 40.0000 mg | ORAL_TABLET | Freq: Every day | ORAL | 0 refills | Status: DC

## 2018-09-16 MED ORDER — AZITHROMYCIN 250 MG PO TABS: ORAL_TABLET | ORAL | 0 refills | Status: DC

## 2018-09-16 NOTE — Patient Instructions (Signed)
It was great to see you!  Start Azithromycin antibiotic and oral prednisone.  May use cough syrup as needed -- please take at home, may make you drowsy.  Push fluids and get plenty of rest. Please return if you are not improving as expected, or if you have high fevers (>101.5) or difficulty swallowing or worsening productive cough.  Call clinic with questions.  I hope you start feeling better soon!

## 2018-09-16 NOTE — Progress Notes (Signed)
Miranda Hunter is a 32 y.o. female here for a new problem.   History of Present Illness:   Chief Complaint  Patient presents with  . Cough  . achey  . Sore Throat    HPI   Abrupt symptoms on Sunday. Is not bringing mucus up. Very fatigued. Took some prescription cough syrup with some relief. Fever was 101.2 this morning, took ibuprofen. Does have a history of bronchitis. Using inhalers as scheduled. No CP, SOB. Went to a wedding over the weekend and suspects that her symptoms are from something she caught while there.  Vitals:   09/16/18 1320  BP: 118/78  Pulse: (!) 115  Temp: 98.4 F (36.9 C)  SpO2: 96%   Never smoker.   Past Medical History:  Diagnosis Date  . History of chicken pox   . Migraines      Social History   Socioeconomic History  . Marital status: Single    Spouse name: Not on file  . Number of children: 0  . Years of education: Not on file  . Highest education level: Bachelor's degree (e.g., BA, AB, BS)  Occupational History  . Not on file  Social Needs  . Financial resource strain: Not on file  . Food insecurity:    Worry: Not on file    Inability: Not on file  . Transportation needs:    Medical: Not on file    Non-medical: Not on file  Tobacco Use  . Smoking status: Never Smoker  . Smokeless tobacco: Never Used  Substance and Sexual Activity  . Alcohol use: Yes    Comment: socially  . Drug use: No  . Sexual activity: Not Currently    Birth control/protection: Injection  Lifestyle  . Physical activity:    Days per week: Not on file    Minutes per session: Not on file  . Stress: Not on file  Relationships  . Social connections:    Talks on phone: Not on file    Gets together: Not on file    Attends religious service: Not on file    Active member of club or organization: Not on file    Attends meetings of clubs or organizations: Not on file    Relationship status: Not on file  . Intimate partner violence:    Fear of current or ex  partner: Not on file    Emotionally abused: Not on file    Physically abused: Not on file    Forced sexual activity: Not on file  Other Topics Concern  . Not on file  Social History Narrative   She is a Psychiatric nurseSam"   Recently got a new puppy- a golden retriever named [name]   Caffeine once a week       Past Surgical History:  Procedure Laterality Date  . tonsillectomy and adnoidectomy Bilateral 2007    Family History  Problem Relation Age of Onset  . Prostate cancer Father   . Asthma Sister   . Lung cancer Maternal Grandmother        heavy smoker  . Lung cancer Maternal Grandfather        heavy smoker  . Asthma Sister     Allergies  Allergen Reactions  . Amoxicillin-Pot Clavulanate Diarrhea    Current Medications:   Current Outpatient Medications:  .  albuterol (PROVENTIL HFA;VENTOLIN HFA) 108 (90 Base) MCG/ACT inhaler, Inhale 2 puffs into the lungs every 6 (six) hours as needed for wheezing or shortness of  breath., Disp: 1 Inhaler, Rfl: 2 .  budesonide-formoterol (SYMBICORT) 160-4.5 MCG/ACT inhaler, Inhale 2 puffs into the lungs 2 (two) times daily., Disp: 1 Inhaler, Rfl: 3 .  Butalbital-APAP-Caffeine 50-300-40 MG CAPS, Take 1 tablet by mouth every 4 (four) hours as needed. , Disp: , Rfl:  .  cetirizine (ZYRTEC) 10 MG tablet, Take 10 mg by mouth daily., Disp: , Rfl:  .  Diclofenac Potassium 50 MG PACK, Take 50 mg by mouth once as needed. Take once daily as needed with headache onset. Please take with food, Disp: 9 each, Rfl: 0 .  Flaxseed, Linseed, (FLAX SEEDS PO), Take 1 tablet by mouth at bedtime., Disp: , Rfl:  .  frovatriptan (FROVA) 2.5 MG tablet, Take 1 tablet (2.5 mg total) by mouth as needed for migraine. If recurs, may repeat after 2 hours. Max of 2 tabs in 24 hours., Disp: 10 tablet, Rfl: 11 .  ketorolac (TORADOL) 10 MG tablet, Take 1 tablet (10 mg total) by mouth every 6 (six) hours as needed., Disp: 20 tablet, Rfl: 11 .  medroxyPROGESTERone  (DEPO-PROVERA) 150 MG/ML injection, Inject 150 mg into the muscle every 3 (three) months. , Disp: , Rfl:  .  Multiple Vitamin (MULTIVITAMIN) tablet, Take 1 tablet by mouth daily., Disp: , Rfl:  .  ondansetron (ZOFRAN) 8 MG tablet, Take 8 mg by mouth every 8 (eight) hours as needed. , Disp: , Rfl:  .  ondansetron (ZOFRAN-ODT) 4 MG disintegrating tablet, Take 1 tablet (4 mg total) by mouth every 8 (eight) hours as needed for nausea., Disp: 60 tablet, Rfl: 11 .  azithromycin (ZITHROMAX) 250 MG tablet, Take two tablets on day 1, then one tablet daily x 4 days, Disp: 6 tablet, Rfl: 0 .  doxycycline (VIBRA-TABS) 100 MG tablet, Take 1 tablet (100 mg total) by mouth 2 (two) times daily. (Patient not taking: Reported on 09/16/2018), Disp: 20 tablet, Rfl: 0 .  guaiFENesin-codeine (CHERATUSSIN AC) 100-10 MG/5ML syrup, Take 10 mLs by mouth at bedtime as needed for cough or congestion., Disp: 120 mL, Rfl: 0 .  predniSONE (DELTASONE) 20 MG tablet, Take 2 tablets (40 mg total) by mouth daily., Disp: 10 tablet, Rfl: 0   Review of Systems:   ROS Negative unless otherwise specified per HPI.  Vitals:   Vitals:   09/16/18 1320  BP: 118/78  Pulse: (!) 115  Temp: 98.4 F (36.9 C)  TempSrc: Oral  SpO2: 96%  Weight: 256 lb 6.4 oz (116.3 kg)  Height: 5\' 9"  (1.753 m)     Body mass index is 37.86 kg/m.  Physical Exam:   Physical Exam  Constitutional: She appears well-developed. She is cooperative.  Non-toxic appearance. She does not have a sickly appearance. She does not appear ill. No distress.  HENT:  Head: Normocephalic and atraumatic.  Right Ear: External ear and ear canal normal. Tympanic membrane is not erythematous, not retracted and not bulging. A middle ear effusion is present.  Left Ear: Tympanic membrane, external ear and ear canal normal. Tympanic membrane is not erythematous, not retracted and not bulging.  Nose: Mucosal edema and rhinorrhea present. Right sinus exhibits no maxillary sinus  tenderness and no frontal sinus tenderness. Left sinus exhibits no maxillary sinus tenderness and no frontal sinus tenderness.  Mouth/Throat: Uvula is midline and mucous membranes are normal. Posterior oropharyngeal erythema present. No posterior oropharyngeal edema. Tonsils are 1+ on the right. Tonsils are 1+ on the left.  Eyes: Conjunctivae and lids are normal.  Neck: Trachea normal.  Cardiovascular:  Normal rate, regular rhythm, S1 normal, S2 normal and normal heart sounds.  Pulmonary/Chest: Effort normal. She has no decreased breath sounds. She has wheezes in the right upper field. She has no rhonchi. She has no rales.  Lymphadenopathy:    She has no cervical adenopathy.  Neurological: She is alert.  Skin: Skin is warm, dry and intact.  Psychiatric: She has a normal mood and affect. Her speech is normal and behavior is normal.  Nursing note and vitals reviewed.   Assessment and Plan:    Anaiyah was seen today for cough, achey and sore throat.  Diagnoses and all orders for this visit:  Moderate persistent asthma, unspecified whether complicated; Cough No red flags on exam. Flu test negative. Start oral prednisone and azithromycin. Continue current inhalers. Discussed taking medications as prescribed. Reviewed return precautions including worsening fever, SOB, worsening cough or other concerns. Push fluids and rest. I recommend that patient follow-up if symptoms worsen or persist despite treatment x 7-10 days, sooner if needed.  -     POCT Influenza A/B  Other orders -     predniSONE (DELTASONE) 20 MG tablet; Take 2 tablets (40 mg total) by mouth daily. -     azithromycin (ZITHROMAX) 250 MG tablet; Take two tablets on day 1, then one tablet daily x 4 days -     Discontinue: guaiFENesin-codeine (CHERATUSSIN AC) 100-10 MG/5ML syrup; Take 10 mLs by mouth at bedtime as needed for cough or congestion. -     guaiFENesin-codeine (CHERATUSSIN AC) 100-10 MG/5ML syrup; Take 10 mLs by mouth at  bedtime as needed for cough or congestion.   . Reviewed expectations re: course of current medical issues. . Discussed self-management of symptoms. . Outlined signs and symptoms indicating need for more acute intervention. . Patient verbalized understanding and all questions were answered. . See orders for this visit as documented in the electronic medical record. . Patient received an After-Visit Summary.  CMA or LPN served as scribe during this visit. History, Physical, and Plan performed by medical provider. The above documentation has been reviewed and is accurate and complete.   Jarold Motto, PA-C

## 2018-10-13 ENCOUNTER — Telehealth: Payer: Self-pay | Admitting: Neurology

## 2018-10-13 NOTE — Telephone Encounter (Signed)
Spoke with patient. She stated that she had been on the Depo shot for 3 years, had to switch to another, so she tried the Nexplanon. That made her migrianes worse so she switched back to Depo shot for awhile then had to try something else. She started Loestrin oral 1.5 weeks ago and headache is not better. She said she will think she is good but then an aura will hit and she will "go completely blind" and have the pain on her R side of head at 8/10. She asked if she should continue with acute treatment or try a preventive. Toradol is what she uses most but can't use it at work typically because it makes her groggy. RN advised that she would d/w Dr. Lucia GaskinsAhern and let her know. Pt was very appreciative.

## 2018-10-13 NOTE — Telephone Encounter (Signed)
Pt requesting a call stating she has been experiencing intense h/a due to a switch in birthcontrol. Unsure id she should start a daily medication to help with h/a. Please call to advise

## 2018-10-13 NOTE — Telephone Encounter (Signed)
Spoke with Dr. Lucia GaskinsAhern. Pt needs to be seen in office. Last office visit was Dec 2018. Called pt and offered appt for tomorrow either 11:30 or 1:00. Pt agreed and took the 1:00 appt arrival 12:30 pm. She verbalized appreciation.

## 2018-10-14 ENCOUNTER — Ambulatory Visit (INDEPENDENT_AMBULATORY_CARE_PROVIDER_SITE_OTHER): Payer: 59 | Admitting: Neurology

## 2018-10-14 ENCOUNTER — Encounter: Payer: Self-pay | Admitting: Neurology

## 2018-10-14 VITALS — BP 115/79 | HR 108 | Ht 69.0 in | Wt 260.0 lb

## 2018-10-14 DIAGNOSIS — G43109 Migraine with aura, not intractable, without status migrainosus: Secondary | ICD-10-CM | POA: Diagnosis not present

## 2018-10-14 DIAGNOSIS — E669 Obesity, unspecified: Secondary | ICD-10-CM

## 2018-10-14 MED ORDER — RIZATRIPTAN BENZOATE 10 MG PO TBDP: 10.0000 mg | ORAL_TABLET | ORAL | 11 refills | Status: DC | PRN

## 2018-10-14 MED ORDER — ERENUMAB-AOOE 70 MG/ML ~~LOC~~ SOAJ: 70.0000 mg | SUBCUTANEOUS | 1 refills | Status: DC

## 2018-10-14 MED ORDER — ONDANSETRON 4 MG PO TBDP: 4.0000 mg | ORAL_TABLET | Freq: Three times a day (TID) | ORAL | 3 refills | Status: DC | PRN

## 2018-10-14 MED ORDER — TOPIRAMATE ER 50 MG PO CAP24: 50.0000 mg | ORAL_CAPSULE | Freq: Every day | ORAL | 0 refills | Status: DC

## 2018-10-14 NOTE — Progress Notes (Signed)
[facility]    Provider:  Dr Lucia Hunter Referring Provider: Jarold Motto, PA Primary Care Physician:  Miranda Motto, PA  CC:  Migraines  Interval history 10/14/2018: changed BC method and since then daily headaches. She was on depo q4months and did well but had to stop it because of the bone density fear. It'll take time on the new birth control medication, she is having migraines daily.  We spent an extended amount of time discussing birth control and migraines.  She was just changed to low-dose estrogen, distressed stroke risks in patients with migraine aura.  Medication she is on is very similar to NuvaRing, I do like NuvaRing in the management of migraine headaches as it appears to keep the estrogen levels very stable which is important and migraines.  At this point I do agree with her gynecologist at that it just may take several months to become adjusted to the new medications.  Discussed with patient why she wanted to do in the meantime, she is gained lots of weight and so was in favor of starting Topamax as a preventative but I discussed she probably needs a weight loss program so we will refer her to the [facility] with Miranda. Orlene Hunter.  We can also give her samples of Aimovig for several months.  Asked her to email me in 6 to 8 weeks and if she is feeling better she may go back to her prior baseline and not need preventatives.  HPI:  Miranda Hunter is a 32 y.o. female here as a referral from Miranda. Bufford Hunter for migraines. Mother is here and provides information. She started having migraines in HS and then worsened as a freshman in college. [facility] and [facility] medications. No known triggers. Managing her menstruation helped. In October her migraine lasted 9 days. She was vomiting, was so painful, 10/10 pain and this was her tipping point. She has had no auras but also has an aura, starts on the left side,  throbbing, pulsating, light/sound/smell sensitivity, dark room better, movement makes it worse, her whole body hurts. Extensive family history of migraines in mother, grandmother. She has numbness in the toes and tongue, no weakness. Every 3 months she gets a migraine and it is predictable. No other focal neurologic deficits, associated symptoms, inciting events or modifiable factors.  Meds tried: propranolol, Topiramate, nortriptyline  Reviewed notes, labs and imaging from outside physicians, which showed:  Reviewed referring physician's notes, patient was last seen in October 2018 and she had been dealing with a migraine for 8 days, 3-4 clusters of migraines throughout the day, in the past she typically would get one headache per month prior to.,  Not sexually active, Imitrex did not work and gave her side effects, butalbital makes her very moody and temperamental, has never been on a preventative medication.  In the past she worked on diet elimination but this was not helpful, headaches would typically come and go around her.  Periods started at the age of 7, worsened as a freshman in college.  She [facility] the [facility] did biofeedback without relief.  Did not do acupuncture.  Her OB/GYN put her on Depo-Provera which has helped for the last 3 years.  Tried Nexplanon for 2 years but discontinued due to continuous headache and bleeding.  Never had imaging of her brain.  TSH, CMP, HIV, CBC all unremarkable only abnormality with slightly elevated white blood cells 11.7.  And  elevated platelets 445.  Review of Systems: Patient complains of symptoms per HPI as well as the following symptoms: headache. Pertinent negatives and positives per HPI. All others negative.   Social History   Socioeconomic History  . Marital status: Single    Spouse name: Not on file  . Number of children: 0  . Years of education: Not on file  . Highest education level: Bachelor's degree (e.g., BA, AB, BS)    Occupational History  . Not on file  Social Needs  . Financial resource strain: Not on file  . Food insecurity:    Worry: Not on file    Inability: Not on file  . Transportation needs:    Medical: Not on file    Non-medical: Not on file  Tobacco Use  . Smoking status: Never Smoker  . Smokeless tobacco: Never Used  Substance and Sexual Activity  . Alcohol use: Yes    Comment: socially  . Drug use: No  . Sexual activity: Not Currently    Birth control/protection: Injection  Lifestyle  . Physical activity:    Days per week: Not on file    Minutes per session: Not on file  . Stress: Not on file  Relationships  . Social connections:    Talks on phone: Not on file    Gets together: Not on file    Attends religious service: Not on file    Active member of club or organization: Not on file    Attends meetings of clubs or organizations: Not on file    Relationship status: Not on file  . Intimate partner violence:    Fear of current or ex partner: Not on file    Emotionally abused: Not on file    Physically abused: Not on file    Forced sexual activity: Not on file  Other Topics Concern  . Not on file  Social History Narrative   She is a Psychiatric nurseSam"   Recently got a new puppy- a golden retriever named [name]   Caffeine once a week       Family History  Problem Relation Age of Onset  . Prostate cancer Father   . Asthma Sister   . Lung cancer Maternal Grandmother        heavy smoker  . Lung cancer Maternal Grandfather        heavy smoker  . Asthma Sister     Past Medical History:  Diagnosis Date  . History of chicken pox   . Migraines     Past Surgical History:  Procedure Laterality Date  . tonsillectomy and adnoidectomy Bilateral 2007    Current Outpatient Medications  Medication Sig Dispense Refill  . albuterol (PROVENTIL HFA;VENTOLIN HFA) 108 (90 Base) MCG/ACT inhaler Inhale 2 puffs into the lungs every 6 (six) hours as needed for wheezing or  shortness of breath. 1 Inhaler 2  . budesonide-formoterol (SYMBICORT) 160-4.5 MCG/ACT inhaler Inhale 2 puffs into the lungs 2 (two) times daily. 1 Inhaler 3  . cetirizine (ZYRTEC) 10 MG tablet Take 10 mg by mouth daily.    Marland Kitchen doxycycline (VIBRA-TABS) 100 MG tablet Take 1 tablet (100 mg total) by mouth 2 (two) times daily. 20 tablet 0  . Flaxseed, Linseed, (FLAX SEEDS PO) Take 1 tablet by mouth at bedtime.    Marland Kitchen ketorolac (TORADOL) 10 MG tablet Take 1 tablet (10 mg total) by mouth every 6 (six) hours as needed. 20 tablet 11  . Multiple Vitamin (MULTIVITAMIN) tablet Take  1 tablet by mouth daily.    . norethindrone (MICRONOR,CAMILA,ERRIN) 0.35 MG tablet Take 1 tablet by mouth daily.    . ondansetron (ZOFRAN) 8 MG tablet Take 8 mg by mouth every 8 (eight) hours as needed.     . Butalbital-APAP-Caffeine 50-300-40 MG CAPS Take 1 tablet by mouth every 4 (four) hours as needed.     . Diclofenac Potassium 50 MG PACK Take 50 mg by mouth once as needed. Take once daily as needed with headache onset. Please take with food (Patient not taking: Reported on 10/14/2018) 9 each 0  . Erenumab-aooe (AIMOVIG) 70 MG/ML SOAJ Inject 70 mg into the skin every 30 (thirty) days. 1 pen 1  . frovatriptan (FROVA) 2.5 MG tablet Take 1 tablet (2.5 mg total) by mouth as needed for migraine. If recurs, may repeat after 2 hours. Max of 2 tabs in 24 hours. (Patient not taking: Reported on 10/14/2018) 10 tablet 11  . guaiFENesin-codeine (CHERATUSSIN AC) 100-10 MG/5ML syrup Take 10 mLs by mouth at bedtime as needed for cough or congestion. (Patient not taking: Reported on 10/14/2018) 120 mL 0  . ondansetron (ZOFRAN-ODT) 4 MG disintegrating tablet Take 1 tablet (4 mg total) by mouth every 8 (eight) hours as needed for nausea. (Patient not taking: Reported on 10/14/2018) 60 tablet 11  . ondansetron (ZOFRAN-ODT) 4 MG disintegrating tablet Take 1 tablet (4 mg total) by mouth every 8 (eight) hours as needed for nausea. 30 tablet 3  .  predniSONE (DELTASONE) 20 MG tablet Take 2 tablets (40 mg total) by mouth daily. (Patient not taking: Reported on 10/14/2018) 10 tablet 0  . rizatriptan (MAXALT-MLT) 10 MG disintegrating tablet Take 1 tablet (10 mg total) by mouth as needed for migraine. May repeat in 2 hours if needed 9 tablet 11  . Topiramate ER (TROKENDI XR) 50 MG CP24 Take 50 mg by mouth at bedtime. 30 capsule 0   No current facility-administered medications for this visit.     Allergies as of 10/14/2018 - Review Complete 10/14/2018  Allergen Reaction Noted  . Amoxicillin-pot clavulanate Diarrhea 12/04/2016    Vitals: BP 115/79   Pulse (!) 108   Ht 5\' 9"  (1.753 m)   Wt 260 lb (117.9 kg)   LMP  (LMP Unknown) Comment: gets periods occasionally-every 2-3 mos per pt  BMI 38.40 kg/m  Last Weight:  Wt Readings from Last 1 Encounters:  10/14/18 260 lb (117.9 kg)   Last Height:   Ht Readings from Last 1 Encounters:  10/14/18 5\' 9"  (1.753 m)   Physical exam: Exam: Gen: NAD, conversant, well nourised, obese, well groomed                     CV: RRR, no MRG. No Carotid Bruits. No peripheral edema, warm, nontender Eyes: Conjunctivae clear without exudates or hemorrhage  Neuro: Detailed Neurologic Exam  Speech:    Speech is normal; fluent and spontaneous with normal comprehension.  Cognition:    The patient is oriented to person, place, and time;     recent and remote memory intact;     language fluent;     normal attention, concentration,     fund of knowledge Cranial Nerves:    The pupils are equal, round, and reactive to light. The fundi are normal and spontaneous venous pulsations are present. Visual fields are full to finger confrontation. Extraocular movements are intact. Trigeminal sensation is intact and the muscles of mastication are normal. The face is symmetric. The palate  elevates in the midline. Hearing intact. Voice is normal. Shoulder shrug is normal. The tongue has normal motion without  fasciculations.   Coordination:    Normal finger to nose and heel to shin. Normal rapid alternating movements.   Gait:    Heel-toe and tandem gait are normal.   Motor Observation:    No asymmetry, no atrophy, and no involuntary movements noted. Tone:    Normal muscle tone.    Posture:    Posture is normal. normal erect    Strength:    Strength is V/V in the upper and lower limbs.      Sensation: intact to LT     Reflex Exam:  DTR's:    Deep tendon reflexes in the upper and lower extremities are normal bilaterally.   Toes:    The toes are downgoing bilaterally.   Clonus:    Clonus is absent.    Assessment/Plan:  32 year old with migraine with aura.  In the past her migraines were predictable and we treated preventatively only.  Patient switched her birth control 2 weeks ago and since then she has been having daily migraines.  I do agree with her gynecologist that this may just be from the hormonal change in several months her migraines may reduce to prior frequency.  In the meantime did discuss with patient strategies we will treat her with preventatives for the next 2 months and then possibly wean her off and see how she does.  At onset of migraine for acute management: Maxalt and zofran  For prevention of migraine: Topiramate and Aimovig we will try this for several months and then wean her off and see how she does.  Morbid obesity: [facility]  Orders Placed This Encounter  Procedures  . Ambulatory referral to [facility]   Meds ordered this encounter  Medications  . Erenumab-aooe (AIMOVIG) 70 MG/ML SOAJ    Sig: Inject 70 mg into the skin every 30 (thirty) days.    Dispense:  1 pen    Refill:  1    [id number] 1/21 dispense 2 pens  . rizatriptan (MAXALT-MLT) 10 MG disintegrating tablet    Sig: Take 1 tablet (10 mg total) by mouth as needed for migraine. May repeat in 2 hours if needed    Dispense:  9 tablet    Refill:  11  . ondansetron  (ZOFRAN-ODT) 4 MG disintegrating tablet    Sig: Take 1 tablet (4 mg total) by mouth every 8 (eight) hours as needed for nausea.    Dispense:  30 tablet    Refill:  3  . Topiramate ER (TROKENDI XR) 50 MG CP24    Sig: Take 50 mg by mouth at bedtime.    Dispense:  30 capsule    Refill:  0    There is increased risk for stroke in women with migraine with aura and a  Contraindication for the combined contraceptive pill for use by women who have migraine with aura, which is in line with World Health Organisation recommendations. The risk for women with migraine without aura is lower and other risk factors like smoking are far more likely to increase stroke risk than migraine. There is a recommendation for no smoking and for the use of low estrogen or progestogen only pills particularly for women with migraine with aura. It is important however that women with migraine who are taking the pill do not decide to suddenly stop taking it without discussing this with their  doctor. Please discuss with her OB/GYN.  Discussed: To prevent or relieve headaches, try the following: Cool Compress. Lie down and place a cool compress on your head.  Avoid headache triggers. If certain foods or odors seem to have triggered your migraines in the past, avoid them. A headache diary might help you identify triggers.  Include physical activity in your daily routine. Try a daily walk or other moderate aerobic exercise.  Manage stress. Find healthy ways to cope with the stressors, such as delegating tasks on your to-do list.  Practice relaxation techniques. Try deep breathing, yoga, massage and visualization.  Eat regularly. Eating regularly scheduled meals and maintaining a healthy diet might help prevent headaches. Also, drink plenty of fluids.  Follow a regular sleep schedule. Sleep deprivation might contribute to headaches Consider biofeedback. With this mind-body technique, you learn to control certain bodily functions -  such as muscle tension, heart rate and blood pressure - to prevent headaches or reduce headache pain.    Proceed to emergency room if you experience new or worsening symptoms or symptoms do not resolve, if you have new neurologic symptoms or if headache is severe, or for any concerning symptom.   Provided education and documentation from American headache Society toolbox including articles on: chronic migraine medication overuse headache, chronic migraines, prevention of migraines, behavioral and other nonpharmacologic treatments for headache.  Cc: Miranda MottoWorley, Evelynn, [place]   Naomie DeanAntonia Elfreda Blanchet, MD  [facility] [address] [place], [place] [id number]  Phone [phone] Fax [phone]  A total of 45 minutes was spent face-to-face with this patient. Over half this time was spent on counseling patient on the  1. Migraine with aura and without status migrainosus, not intractable   2. Obesity (BMI 30-39.9)    diagnosis and different diagnostic and therapeutic options, counseling and coordination of care, risks ans benefits of management, compliance, or risk factor reduction and education.

## 2018-10-14 NOTE — Patient Instructions (Addendum)
Rizatriptan: Please take one tablet at the onset of your headache. If it does not improve the symptoms please take one additional tablet. Do not take more then 2 tablets in 24hrs. Do not take use more then 2 to 3 times in a week. Zofran: for nausea or migraine. May take with Rizatriptan Trokendi 50mg  at bedtime every noght [place] x 2 months  Email me in 6 weeks and we can discuss what to do next, keep me update on progress [facility]    Topiramate extended-release capsules What is this medicine? TOPIRAMATE (toe PYRE a mate) is used to treat seizures in adults or children with epilepsy. It is also used for the prevention of migraine headaches. This medicine may be used for other purposes; ask your health care provider or pharmacist if you have questions. COMMON BRAND NAME(S): Trokendi XR What should I tell my health care provider before I take this medicine? They need to know if you have any of these conditions: -cirrhosis of the liver or liver disease -diarrhea -glaucoma -kidney stones or kidney disease -lung disease like asthma, obstructive pulmonary disease, emphysema -metabolic acidosis -on a ketogenic diet -scheduled for surgery or a procedure -suicidal thoughts, plans, or attempt; a previous suicide attempt by you or a family member -an unusual or allergic reaction to topiramate, other medicines, foods, dyes, or preservatives -pregnant or trying to get pregnant -breast-feeding How should I use this medicine? Take this medicine by mouth with a glass of water. Follow the directions on the prescription label. Trokendi XR capsules must be swallowed whole. Do not sprinkle on food, break, crush, dissolve, or chew. Qudexy XR capsules may be swallowed whole or opened and sprinkled on a small amount of soft food. This mixture must be swallowed immediately. Do not chew or store mixture for later use. You may take this medicine with meals. Take your medicine at  regular intervals. Do not take it more often than directed. Talk to your pediatrician regarding the use of this medicine in children. Special care may be needed. While Trokendi XR may be prescribed for children as young as 6 years and Qudexy XR may be prescribed for children as young as 2 years for selected conditions, precautions do apply. Overdosage: If you think you have taken too much of this medicine contact a poison control center or emergency room at once. NOTE: This medicine is only for you. Do not share this medicine with others. What if I miss a dose? If you miss a dose, take it as soon as you can. If it is almost time for your next dose, take only that dose. Do not take double or extra doses. What may interact with this medicine? Do not take this medicine with any of the following medications: -probenecid This medicine may also interact with the following medications: -acetazolamide -alcohol -amitriptyline -birth control pills -digoxin -hydrochlorothiazide -lithium -medicines for pain, sleep, or muscle relaxation -metformin -methazolamide -other seizure or epilepsy medicines -pioglitazone -risperidone This list may not describe all possible interactions. Give your health care provider a list of all the medicines, herbs, non-prescription drugs, or dietary supplements you use. Also tell them if you smoke, drink alcohol, or use illegal drugs. Some items may interact with your medicine. What should I watch for while using this medicine? Visit your doctor or health care professional for regular checks on your progress. Do not stop taking this medicine suddenly. This increases the risk of seizures if you are using this medicine to control  epilepsy. Wear a medical identification bracelet or chain to say you have epilepsy or seizures, and carry a card that lists all your medicines. This medicine can decrease sweating and increase your body temperature. Watch for signs of deceased  sweating or fever, especially in children. Avoid extreme heat, hot baths, and saunas. Be careful about exercising, especially in hot weather. Contact your health care provider right away if you notice a fever or decrease in sweating. You should drink plenty of fluids while taking this medicine. If you have had kidney stones in the past, this will help to reduce your chances of forming kidney stones. If you have stomach pain, with nausea or vomiting and yellowing of your eyes or skin, call your doctor immediately. You may get drowsy, dizzy, or have blurred vision. Do not drive, use machinery, or do anything that needs mental alertness until you know how this medicine affects you. To reduce dizziness, do not sit or stand up quickly, especially if you are an older patient. Alcohol can increase drowsiness and dizziness. Avoid alcoholic drinks. Do not drink alcohol for 6 hours before or 6 hours after taking Trokendi XR. If you notice blurred vision, eye pain, or other eye problems, seek medical attention at once for an eye exam. The use of this medicine may increase the chance of suicidal thoughts or actions. Pay special attention to how you are responding while on this medicine. Any worsening of mood, or thoughts of suicide or dying should be reported to your health care professional right away. This medicine may increase the chance of developing metabolic acidosis. If left untreated, this can cause kidney stones, bone disease, or slowed growth in children. Symptoms include breathing fast, fatigue, loss of appetite, irregular heartbeat, or loss of consciousness. Call your doctor immediately if you experience any of these side effects. Also, tell your doctor about any surgery you plan on having while taking this medicine since this may increase your risk for metabolic acidosis. Birth control pills may not work properly while you are taking this medicine. Talk to your doctor about using an extra method of birth  control. Women who become pregnant while using this medicine may enroll in the [place] [employer] Antiepileptic Drug Pregnancy Registry by calling [phone]. This registry collects information about the safety of antiepileptic drug use during pregnancy. What side effects may I notice from receiving this medicine? Side effects that you should report to your doctor or health care professional as soon as possible: -allergic reactions like skin rash, itching or hives, swelling of the face, lips, or tongue -decreased sweating and/or rise in body temperature -depression -difficulty breathing, fast or irregular breathing patterns -difficulty speaking -difficulty walking or controlling muscle movements -hearing impairment -redness, blistering, peeling or loosening of the skin, including inside the mouth -tingling, pain or numbness in the hands or feet -unusually weak or tired -worsening of mood, thoughts or actions of suicide or dying Side effects that usually do not require medical attention (report to your doctor or health care professional if they continue or are bothersome): -altered taste -back pain, joint or muscle aches and pains -diarrhea, or constipation -headache -loss of appetite -nausea -stomach upset, indigestion -tremors This list may not describe all possible side effects. Call your doctor for medical advice about side effects. You may report side effects to FDA at 1-800-FDA-1088. Where should I keep my medicine? Keep out of the reach of children. Store at room temperature between 15 and 30 degrees C (59 and 86 degrees F)  in a tightly closed container. Protect from moisture. Throw away any unused medicine after the expiration date. NOTE: This sheet is a summary. It may not cover all possible information. If you have questions about this medicine, talk to your doctor, pharmacist, or health care provider.  2018 [employer] Standard (2016-03-02 12:33:11)   Ondansetron oral  dissolving tablet What is this medicine? ONDANSETRON (on DAN se tron) is used to treat nausea and vomiting caused by chemotherapy. It is also used to prevent or treat nausea and vomiting after surgery. This medicine may be used for other purposes; ask your health care provider or pharmacist if you have questions. COMMON BRAND NAME(S): Zofran ODT What should I tell my health care provider before I take this medicine? They need to know if you have any of these conditions: -heart disease -history of irregular heartbeat -liver disease -low levels of magnesium or potassium in the blood -an unusual or allergic reaction to ondansetron, granisetron, other medicines, foods, dyes, or preservatives -pregnant or trying to get pregnant -breast-feeding How should I use this medicine? These tablets are made to dissolve in the mouth. Do not try to push the tablet through the foil backing. With dry hands, peel away the foil backing and gently remove the tablet. Place the tablet in the mouth and allow it to dissolve, then swallow. While you may take these tablets with water, it is not necessary to do so. Talk to your pediatrician regarding the use of this medicine in children. Special care may be needed. Overdosage: If you think you have taken too much of this medicine contact a poison control center or emergency room at once. NOTE: This medicine is only for you. Do not share this medicine with others. What if I miss a dose? If you miss a dose, take it as soon as you can. If it is almost time for your next dose, take only that dose. Do not take double or extra doses. What may interact with this medicine? Do not take this medicine with any of the following medications: -apomorphine -certain medicines for fungal infections like fluconazole, itraconazole, ketoconazole, posaconazole, voriconazole -cisapride -dofetilide -dronedarone -pimozide -thioridazine -ziprasidone This medicine may also interact with  the following medications: -carbamazepine -certain medicines for depression, anxiety, or psychotic disturbances -fentanyl -linezolid -MAOIs like Carbex, Eldepryl, Marplan, Nardil, and Parnate -methylene blue (injected into a vein) -other medicines that prolong the QT interval (cause an abnormal heart rhythm) -phenytoin -rifampicin -tramadol This list may not describe all possible interactions. Give your health care provider a list of all the medicines, herbs, non-prescription drugs, or dietary supplements you use. Also tell them if you smoke, drink alcohol, or use illegal drugs. Some items may interact with your medicine. What should I watch for while using this medicine? Check with your doctor or health care professional as soon as you can if you have any sign of an allergic reaction. What side effects may I notice from receiving this medicine? Side effects that you should report to your doctor or health care professional as soon as possible: -allergic reactions like skin rash, itching or hives, swelling of the face, lips, or tongue -breathing problems -confusion -dizziness -fast or irregular heartbeat -feeling faint or lightheaded, falls -fever and chills -loss of balance or coordination -seizures -sweating -swelling of the hands and feet -tightness in the chest -tremors -unusually weak or tired Side effects that usually do not require medical attention (report to your doctor or health care professional if they continue or are bothersome): -  constipation or diarrhea -headache This list may not describe all possible side effects. Call your doctor for medical advice about side effects. You may report side effects to FDA at 1-800-FDA-1088. Where should I keep my medicine? Keep out of the reach of children. Store between 2 and 30 degrees C (36 and 86 degrees F). Throw away any unused medicine after the expiration date. NOTE: This sheet is a summary. It may not cover all possible  information. If you have questions about this medicine, talk to your doctor, pharmacist, or health care provider.  2018 Elsevier/Gold Standard (2013-08-19 16:21:52)   Rizatriptan tablets What is this medicine? RIZATRIPTAN (rye za TRIP tan) is used to treat migraines with or without aura. An aura is a strange feeling or visual disturbance that warns you of an attack. It is not used to prevent migraines. This medicine may be used for other purposes; ask your health care provider or pharmacist if you have questions. COMMON BRAND NAME(S): Maxalt What should I tell my health care provider before I take this medicine? They need to know if you have any of these conditions: -bowel disease or colitis -diabetes -family history of heart disease -fast or irregular heart beat -heart or blood vessel disease, angina (chest pain), or previous heart attack -high blood pressure -high cholesterol -history of stroke, transient ischemic attacks (TIAs or mini-strokes), or intracranial bleeding -kidney or liver disease -overweight -poor circulation -postmenopausal or surgical removal of uterus and ovaries -Raynaud's disease -seizure disorder -an unusual or allergic reaction to rizatriptan, other medicines, foods, dyes, or preservatives -pregnant or trying to get pregnant -breast-feeding How should I use this medicine? This medicine is taken by mouth with a glass of water. Follow the directions on the prescription label. This medicine is taken at the first symptoms of a migraine. It is not for everyday use. If your migraine headache returns after one dose, you can take another dose as directed. You must leave at least 2 hours between doses, and do not take more than 30 mg total in 24 hours. If there is no improvement at all after the first dose, do not take a second dose without talking to your doctor or health care professional. Do not take your medicine more often than directed. Talk to your pediatrician  regarding the use of this medicine in children. While this drug may be prescribed for children as young as 6 years for selected conditions, precautions do apply. Overdosage: If you think you have taken too much of this medicine contact a poison control center or emergency room at once. NOTE: This medicine is only for you. Do not share this medicine with others. What if I miss a dose? This does not apply; this medicine is not for regular use. What may interact with this medicine? Do not take this medicine with any of the following medicines: -amphetamine, dextroamphetamine or cocaine -dihydroergotamine, ergotamine, ergoloid mesylates, methysergide, or ergot-type medication - do not take within 24 hours of taking rizatriptan -feverfew -MAOIs like Carbex, Eldepryl, Marplan, Nardil, and Parnate - do not take rizatriptan within 2 weeks of stopping MAOI therapy. -other migraine medicines like almotriptan, eletriptan, naratriptan, sumatriptan, zolmitriptan - do not take within 24 hours of taking rizatriptan -tryptophan This medicine may also interact with the following medications: -medicines for mental depression, anxiety or mood problems -propranolol This list may not describe all possible interactions. Give your health care provider a list of all the medicines, herbs, non-prescription drugs, or dietary supplements you use. Also tell them  if you smoke, drink alcohol, or use illegal drugs. Some items may interact with your medicine. What should I watch for while using this medicine? Only take this medicine for a migraine headache. Take it if you get warning symptoms or at the start of a migraine attack. It is not for regular use to prevent migraine attacks. You may get drowsy or dizzy. Do not drive, use machinery, or do anything that needs mental alertness until you know how this medicine affects you. To reduce dizzy or fainting spells, do not sit or stand up quickly, especially if you are an older  patient. Alcohol can increase drowsiness, dizziness and flushing. Avoid alcoholic drinks. Smoking cigarettes may increase the risk of heart-related side effects from using this medicine. If you take migraine medicines for 10 or more days a month, your migraines may get worse. Keep a diary of headache days and medicine use. Contact your healthcare professional if your migraine attacks occur more frequently. What side effects may I notice from receiving this medicine? Side effects that you should report to your doctor or health care professional as soon as possible: -allergic reactions like skin rash, itching or hives, swelling of the face, lips, or tongue -fast, slow, or irregular heart beat -increased or decreased blood pressure -seizures -severe stomach pain and cramping, bloody diarrhea -signs and symptoms of a blood clot such as breathing problems; changes in vision; chest pain; severe, sudden headache; pain, swelling, warmth in the leg; trouble speaking; sudden numbness or weakness of the face, arm or leg -tingling, pain, or numbness in the face, hands, or feet Side effects that usually do not require medical attention (report to your doctor or health care professional if they continue or are bothersome): -drowsiness -dry mouth -feeling warm, flushing, or redness of the face -headache -muscle cramps, pain -nausea, vomiting -unusually weak or tired This list may not describe all possible side effects. Call your doctor for medical advice about side effects. You may report side effects to FDA at 1-800-FDA-1088. Where should I keep my medicine? Keep out of the reach of children. Store at room temperature between 15 and 30 degrees C (59 and 86 degrees F). Keep container tightly closed. Throw away any unused medicine after the expiration date. NOTE: This sheet is a summary. It may not cover all possible information. If you have questions about this medicine, talk to your doctor, pharmacist, or  health care provider.  2018 Elsevier/Gold Standard (2013-07-14 10:16:39)

## 2018-10-22 ENCOUNTER — Other Ambulatory Visit: Payer: Self-pay | Admitting: Neurology

## 2018-10-22 MED ORDER — TOPIRAMATE 50 MG PO TABS: 50.0000 mg | ORAL_TABLET | Freq: Every day | ORAL | 11 refills | Status: DC

## 2018-10-25 ENCOUNTER — Other Ambulatory Visit: Payer: Self-pay | Admitting: Neurology

## 2018-11-13 ENCOUNTER — Encounter (INDEPENDENT_AMBULATORY_CARE_PROVIDER_SITE_OTHER): Payer: 59

## 2018-12-03 ENCOUNTER — Ambulatory Visit (INDEPENDENT_AMBULATORY_CARE_PROVIDER_SITE_OTHER): Payer: 59 | Admitting: Family Medicine

## 2018-12-03 ENCOUNTER — Encounter (INDEPENDENT_AMBULATORY_CARE_PROVIDER_SITE_OTHER): Payer: Self-pay | Admitting: Family Medicine

## 2018-12-03 VITALS — BP 119/80 | HR 89 | Temp 98.1°F | Ht 69.0 in | Wt 257.0 lb

## 2018-12-03 DIAGNOSIS — K588 Other irritable bowel syndrome: Secondary | ICD-10-CM | POA: Diagnosis not present

## 2018-12-03 DIAGNOSIS — R5383 Other fatigue: Secondary | ICD-10-CM

## 2018-12-03 DIAGNOSIS — R0602 Shortness of breath: Secondary | ICD-10-CM | POA: Diagnosis not present

## 2018-12-03 DIAGNOSIS — Z9189 Other specified personal risk factors, not elsewhere classified: Secondary | ICD-10-CM

## 2018-12-03 DIAGNOSIS — Z0289 Encounter for other administrative examinations: Secondary | ICD-10-CM

## 2018-12-03 DIAGNOSIS — Z1331 Encounter for screening for depression: Secondary | ICD-10-CM

## 2018-12-03 DIAGNOSIS — Z6837 Body mass index (BMI) 37.0-37.9, adult: Secondary | ICD-10-CM

## 2018-12-04 LAB — CBC WITH DIFFERENTIAL
Basophils Absolute: 0 10*3/uL (ref 0.0–0.2)
Basos: 0 %
EOS (ABSOLUTE): 0.2 10*3/uL (ref 0.0–0.4)
Eos: 2 %
Hematocrit: 42.9 % (ref 34.0–46.6)
Hemoglobin: 13.8 g/dL (ref 11.1–15.9)
IMMATURE GRANS (ABS): 0 10*3/uL (ref 0.0–0.1)
IMMATURE GRANULOCYTES: 0 %
LYMPHS: 33 %
Lymphocytes Absolute: 3.2 10*3/uL — ABNORMAL HIGH (ref 0.7–3.1)
MCH: 28.9 pg (ref 26.6–33.0)
MCHC: 32.2 g/dL (ref 31.5–35.7)
MCV: 90 fL (ref 79–97)
Monocytes Absolute: 0.6 10*3/uL (ref 0.1–0.9)
Monocytes: 6 %
NEUTROS PCT: 59 %
Neutrophils Absolute: 5.8 10*3/uL (ref 1.4–7.0)
RBC: 4.78 x10E6/uL (ref 3.77–5.28)
RDW: 13.1 % (ref 11.7–15.4)
WBC: 9.8 10*3/uL (ref 3.4–10.8)

## 2018-12-04 LAB — COMPREHENSIVE METABOLIC PANEL
A/G RATIO: 2.1 (ref 1.2–2.2)
ALT: 26 IU/L (ref 0–32)
AST: 20 IU/L (ref 0–40)
Albumin: 4.7 g/dL (ref 3.5–5.5)
Alkaline Phosphatase: 127 IU/L — ABNORMAL HIGH (ref 39–117)
BILIRUBIN TOTAL: 0.3 mg/dL (ref 0.0–1.2)
BUN/Creatinine Ratio: 7 — ABNORMAL LOW (ref 9–23)
BUN: 5 mg/dL — ABNORMAL LOW (ref 6–20)
CHLORIDE: 102 mmol/L (ref 96–106)
CO2: 22 mmol/L (ref 20–29)
Calcium: 10.1 mg/dL (ref 8.7–10.2)
Creatinine, Ser: 0.67 mg/dL (ref 0.57–1.00)
GFR calc Af Amer: 136 mL/min/{1.73_m2} (ref >59)
GFR, EST NON AFRICAN AMERICAN: 118 mL/min/{1.73_m2} (ref >59)
GLUCOSE: 81 mg/dL (ref 65–99)
Globulin, Total: 2.2 g/dL (ref 1.5–4.5)
POTASSIUM: 4.8 mmol/L (ref 3.5–5.2)
Sodium: 140 mmol/L (ref 134–144)
TOTAL PROTEIN: 6.9 g/dL (ref 6.0–8.5)

## 2018-12-04 LAB — LIPID PANEL WITH LDL/HDL RATIO
Cholesterol, Total: 170 mg/dL (ref 100–199)
HDL: 34 mg/dL — ABNORMAL LOW (ref >39)
LDL Calculated: 88 mg/dL (ref 0–99)
LDl/HDL Ratio: 2.6 ratio (ref 0.0–3.2)
Triglycerides: 242 mg/dL — ABNORMAL HIGH (ref 0–149)
VLDL Cholesterol Cal: 48 mg/dL — ABNORMAL HIGH (ref 5–40)

## 2018-12-04 LAB — FOLATE: Folate: >20 ng/mL (ref >3.0)

## 2018-12-04 LAB — T3: T3 TOTAL: 150 ng/dL (ref 71–180)

## 2018-12-04 LAB — VITAMIN D 25 HYDROXY (VIT D DEFICIENCY, FRACTURES): Vit D, 25-Hydroxy: 25.8 ng/mL — ABNORMAL LOW (ref 30.0–100.0)

## 2018-12-04 LAB — HEMOGLOBIN A1C
ESTIMATED AVERAGE GLUCOSE: 111 mg/dL
Hgb A1c MFr Bld: 5.5 % (ref 4.8–5.6)

## 2018-12-04 LAB — VITAMIN B12: Vitamin B-12: 452 pg/mL (ref 232–1245)

## 2018-12-04 LAB — T4, FREE: FREE T4: 1.38 ng/dL (ref 0.82–1.77)

## 2018-12-04 LAB — TSH: TSH: 2.15 u[IU]/mL (ref 0.450–4.500)

## 2018-12-04 LAB — INSULIN, RANDOM: INSULIN: 42.1 u[IU]/mL — AB (ref 2.6–24.9)

## 2018-12-04 NOTE — Progress Notes (Signed)
Office: [phone]  /  Fax: [phone]   Dear Dr. Desma MaximAntonia B. Lucia Hunter,  Thank you for referring Miranda Hunter to our clinic. The following note includes my evaluation and treatment recommendations.  HPI:   Chief Complaint: OBESITY    Miranda Hunter has been referred by Miranda MaximAntonia B. Ahern for consultation regarding her obesity and obesity related comorbidities.    Miranda Hunter (MR# [id number]) is a 33 y.o. female who presents on 12/03/2018 for obesity evaluation and treatment. Current BMI is Body mass index is 37.95 kg/m. Miranda Hunter has been struggling with her weight for many years and has been unsuccessful in either losing weight, maintaining weight loss, or reaching her healthy weight goal.     Miranda Hunter attended our information session and states she is currently in the action stage of change and ready to dedicate time achieving and maintaining a healthier weight. Miranda Hunter is interested in becoming our patient and working on intensive lifestyle modifications including (but not limited to) diet, exercise and weight loss.  Miranda Hunter states her family eats meals together she thinks her family will eat healthier with her her desired weight loss is 77 lbs she started gaining weight 6 years ago her heaviest weight ever was 255 lbs. she is sometimes a picky eater and doesn't like to eat healthier foods  she has significant food cravings issues  she skips breakfast sometimes she is frequently drinking liquids with calories she frequently makes poor food choices she has binge eating behaviors she struggles with emotional eating    Fatigue Miranda Hunter feels her energy is lower than it should be. This has worsened with weight gain and has not worsened recently. Miranda Hunter denies daytime somnolence and denies waking up still tired. Patient is at risk for obstructive sleep apnea. Patient generally gets 8 or 9 hours of sleep per night, and states they generally have generally restful sleep. Snoring  is sometimes present. Apneic episodes are not present. Epworth Sleepiness Score is 0.  Dyspnea on exertion Miranda Hunter notes increasing shortness of breath with exercising and seems to be worsening over time with weight gain. She notes getting out of breath sooner with activity than she used to. This has not gotten worse recently. Miranda Hunter denies orthopnea.  At risk for cardiovascular disease Miranda Hunter is at a higher than average risk for cardiovascular disease due to dyspnea and obesity. She currently denies any chest pain.  Irritable Bowel Syndrome Miranda Hunter notes frequent diarrhea with mild abdominal pain for years and she cannot trace this to certain foods or time. She has not had formal testing or a GI evaluation.  Depression Screen Miranda Hunter Food and Mood (modified PHQ-9) score was 13. Depression screen PHQ 2/9 12/03/2018  Decreased Interest 2  Down, Depressed, Hopeless 3  PHQ - 2 Score 5  Altered sleeping 1  Tired, decreased energy 3  Change in appetite 2  Feeling bad or failure about yourself  1  Trouble concentrating 1  Moving slowly or fidgety/restless 0  Suicidal thoughts 0  PHQ-9 Score 13  Difficult doing work/chores Somewhat difficult    ASSESSMENT AND PLAN:  Other fatigue - Plan: EKG 12-Lead, Vitamin B12, CBC With Differential, Comprehensive metabolic panel, Hemoglobin A1c, Insulin, random, T3, T4, free, TSH, VITAMIN D 25 Hydroxy (Vit-D Deficiency, Fractures), Folate  Shortness of breath on exertion - Plan: Lipid Panel With LDL/HDL Ratio  Other irritable bowel syndrome  Depression screening  At risk for heart disease  Class 2 severe obesity with serious comorbidity and body mass index (BMI) of 37.0 to  37.9 in adult, unspecified obesity type (HCC)  PLAN:  Fatigue Miranda Hunter that her fatigue may be related to obesity, depression or many other causes. Labs will be ordered, and in the meanwhile, Miranda Hunter has agreed to work on diet, exercise and weight loss  to help with fatigue. Proper sleep hygiene was discussed including the need for 7-8 hours of quality sleep each night. A sleep study was not ordered based on symptoms and Epworth score. An EKG and an indirect calorimetry was ordered today. Miranda Hunter to follow up in 2 weeks.  Dyspnea on exertion [name]'s shortness of breath appears to be obesity related and exercise induced. She has agreed to work on weight loss and gradually increase exercise to treat her exercise induced shortness of breath. If [name] follows our instructions and loses weight without improvement of her shortness of breath, we will plan to refer to pulmonology. We will monitor this condition regularly. Labs, an indirect calorimetry, and an EKG were ordered today. Miranda Hunter to this plan.  Cardiovascular risk counseling Miranda Hunter was given extended (15 minutes) coronary artery disease prevention counseling today. She is 33 y.o. female and has risk factors for heart disease including dyspnea and obesity. We discussed intensive lifestyle modifications today with an emphasis on specific weight loss instructions and strategies. Pt was also Hunter of the importance of increasing exercise and decreasing saturated fats to help prevent heart disease.  Irritable Bowel Syndrome Catori's diarrhea may be due to irritable bowel syndrome or to excessive simple carbs. She will start on her diet prescription and follow up as directed in 2 weeks.  Depression Screen Miranda Hunter had a moderately positive depression screening. Depression is commonly associated with obesity and often results in emotional eating behaviors. We will monitor this closely and work on CBT to help improve the non-hunger eating patterns. Referral to Psychology may be required if no improvement is seen as she continues in our clinic.  Obesity Miranda Hunter is currently in the action stage of change and her goal is to continue with weight loss efforts. I recommend Miranda Hunter  begin the structured treatment plan as follows:  She has agreed to follow the Category 3 plan. Miranda Hunter has been instructed to eventually work up to a goal of 150 minutes of combined cardio and strengthening exercise per week for weight loss and overall health benefits. We discussed the following Behavioral Modification Strategies today: increasing lean protein intake and decreasing simple carbohydrates.    She was Hunter of the importance of frequent follow up visits to maximize her success with intensive lifestyle modifications for her multiple health conditions. She was Hunter we would discuss her lab results at her next visit unless there is a critical issue that needs to be addressed sooner. Maddalyn agreed to keep her next visit at the agreed upon time to discuss these results.  ALLERGIES: Allergies  Allergen Reactions  . Amoxicillin-Pot Clavulanate Diarrhea    MEDICATIONS: Current Outpatient Medications on File Prior to Visit  Medication Sig Dispense Refill  . albuterol (PROVENTIL HFA;VENTOLIN HFA) 108 (90 Base) MCG/ACT inhaler Inhale 2 puffs into the lungs every 6 (six) hours as needed for wheezing or shortness of breath. 1 Inhaler 2  . cetirizine (ZYRTEC) 10 MG tablet Take 10 mg by mouth daily.    Dorise Hiss. Erenumab-aooe (AIMOVIG) 70 MG/ML SOAJ Inject 70 mg into the skin every 30 (thirty) days. 1 pen 1  . Flaxseed, Linseed, (FLAX SEEDS PO) Take 1 tablet by mouth at bedtime.    Marland Kitchen. ketorolac (  TORADOL) 10 MG tablet Take 1 tablet (10 mg total) by mouth every 6 (six) hours as needed. 20 tablet 11  . Multiple Vitamin (MULTIVITAMIN) tablet Take 1 tablet by mouth daily.    . norethindrone (MICRONOR,CAMILA,ERRIN) 0.35 MG tablet Take 1 tablet by mouth daily.    . ondansetron (ZOFRAN-ODT) 4 MG disintegrating tablet Take 1 tablet (4 mg total) by mouth every 8 (eight) hours as needed for nausea. 30 tablet 3  . rizatriptan (MAXALT-MLT) 10 MG disintegrating tablet Take 1 tablet (10 mg total) by mouth  as needed for migraine. May repeat in 2 hours if needed 9 tablet 11   No current facility-administered medications on file prior to visit.     PAST MEDICAL HISTORY: Past Medical History:  Diagnosis Date  . Anxiety   . Asthma   . History of chicken pox   . IBS (irritable bowel syndrome)   . Migraines     PAST SURGICAL HISTORY: Past Surgical History:  Procedure Laterality Date  . tonsillectomy and adnoidectomy Bilateral 2007    SOCIAL HISTORY: Social History   Tobacco Use  . Smoking status: Never Smoker  . Smokeless tobacco: Never Used  Substance Use Topics  . Alcohol use: Yes    Comment: socially  . Drug use: No    FAMILY HISTORY: Family History  Problem Relation Age of Onset  . Prostate cancer Father   . Hypertension Father   . Asthma Sister   . Lung cancer Maternal Grandmother        heavy smoker  . Lung cancer Maternal Grandfather        heavy smoker  . Asthma Sister     ROS: Review of Systems  Constitutional: Positive for malaise/fatigue. Negative for weight loss.  Respiratory: Shortness of breath:  with activity.   Cardiovascular: Negative for chest pain and orthopnea.  Gastrointestinal: Positive for abdominal pain and diarrhea.  Neurological: Positive for headaches.    PHYSICAL EXAM: Blood pressure 119/80, pulse 89, temperature 98.1 F (36.7 C), temperature source Oral, height 5\' 9"  (1.753 m), weight 257 lb (116.6 kg), last menstrual period 11/30/2018, SpO2 97 %. Body mass index is 37.95 kg/m. Physical Exam Vitals signs reviewed.  Constitutional:      Appearance: Normal appearance. She is obese.  HENT:     Head: Normocephalic and atraumatic.     Nose: Nose normal.  Eyes:     General: No scleral icterus.    Extraocular Movements: Extraocular movements intact.  Neck:     Musculoskeletal: Normal range of motion and neck supple.     Comments: Negative for thyromegaly. Cardiovascular:     Rate and Rhythm: Normal rate and regular rhythm.    Pulmonary:     Effort: Pulmonary effort is normal. No respiratory distress.  Abdominal:     Palpations: Abdomen is soft.     Tenderness: There is no abdominal tenderness.     Comments: Positive for obesity.  Musculoskeletal:     Comments: ROM normal in all extremities.  Skin:    General: Skin is warm and dry.  Neurological:     Mental Status: She is alert and oriented to person, place, and time.     Coordination: Coordination normal.  Psychiatric:        Mood and Affect: Mood normal.        Behavior: Behavior normal.     RECENT LABS AND TESTS: BMET    Component Value Date/Time   NA 138 09/03/2017 0923   K 4.5  09/03/2017 0923   CL 107 09/03/2017 0923   CO2 23 09/03/2017 0923   GLUCOSE 84 09/03/2017 0923   BUN 6 09/03/2017 0923   CREATININE 0.56 09/03/2017 0923   CALCIUM 10.1 09/03/2017 0923   No results found for: HGBA1C No results found for: INSULIN CBC    Component Value Date/Time   WBC 11.7 (H) 09/03/2017 0923   RBC 4.71 09/03/2017 0923   HGB 13.4 09/03/2017 0923   HCT 41.6 09/03/2017 0923   PLT 445.0 (H) 09/03/2017 0923   MCV 88.3 09/03/2017 0923   MCHC 32.2 09/03/2017 0923   RDW 13.4 09/03/2017 0923   LYMPHSABS 4.1 (H) 09/03/2017 0923   MONOABS 0.8 09/03/2017 0923   EOSABS 0.1 09/03/2017 0923   BASOSABS 0.1 09/03/2017 0923   Iron/TIBC/Ferritin/ %Sat No results found for: IRON, TIBC, FERRITIN, IRONPCTSAT Lipid Panel  No results found for: CHOL, TRIG, HDL, CHOLHDL, VLDL, LDLCALC, LDLDIRECT Hepatic Function Panel     Component Value Date/Time   PROT 6.9 09/03/2017 0923   ALBUMIN 4.6 09/03/2017 0923   AST 12 09/03/2017 0923   ALT 17 09/03/2017 0923   ALKPHOS 95 09/03/2017 0923   BILITOT 0.4 09/03/2017 0923      Component Value Date/Time   TSH 2.45 09/03/2017 0923   ECG  shows NSR with a rate of 102 BPM. INDIRECT CALORIMETER done today shows a VO2 of 398 and a REE of 2768.  Her calculated basal metabolic rate is 4356 thus her basal metabolic rate  is better than expected.  OBESITY BEHAVIORAL INTERVENTION VISIT  Today's visit was # 1   Starting weight: 257 lbs Starting date: 12/03/2018 Today's weight : Weight: 257 lb (116.6 kg)  Today's date: 12/03/2018 Total lbs lost to date: 0  ASK: We discussed the diagnosis of obesity with Miranda Ganja today and Jacki agreed to give [place] permission to discuss obesity behavioral modification therapy today.  ASSESS: Guiliana has the diagnosis of obesity and her BMI today is 37.9. Khaylee is in the action stage of change.   ADVISE: Shauntea was educated on the multiple health risks of obesity as well as the benefit of weight loss to improve her health. She was advised of the need for long term treatment and the importance of lifestyle modifications to improve her current health and to decrease her risk of future health problems.  AGREE: Multiple dietary modification options and treatment options were discussed and Chelsye agreed to follow the recommendations documented in the above note.  ARRANGE: Aubryn was educated on the importance of frequent visits to treat obesity as outlined per CMS and USPSTF guidelines and agreed to schedule her next follow up appointment today.  I, Kirke Corin, am acting as transcriptionist for Wilder Glade, MD  I have reviewed the above documentation for accuracy and completeness, and I agree with the above. -Quillian Quince, MD

## 2018-12-11 ENCOUNTER — Encounter: Payer: Self-pay | Admitting: Physician Assistant

## 2018-12-11 ENCOUNTER — Telehealth (INDEPENDENT_AMBULATORY_CARE_PROVIDER_SITE_OTHER): Payer: Self-pay | Admitting: Family Medicine

## 2018-12-11 NOTE — Telephone Encounter (Signed)
Spoke with the patient who states she starting experiencing the following symptoms this morning, lower abd pain, right lower back pain, frequent urination with significantly amount of urine along with diarrhea. This is occurring mostly with walking. Informed the pt per Dr Dalbert GarnetBeasley to make sure she follows up with her PCP asap as this is not normally related to a change in her diet. Pt verbalized understanding.   April, CMA

## 2018-12-11 NOTE — Telephone Encounter (Signed)
Pt is having stomach cramping, back pain and urinating a lot; wishes to have a call back from Dr. Dalbert Garnet; is concerned this is related to starting new diet. drh

## 2018-12-16 ENCOUNTER — Other Ambulatory Visit: Payer: Self-pay | Admitting: Neurology

## 2018-12-16 DIAGNOSIS — G43709 Chronic migraine without aura, not intractable, without status migrainosus: Secondary | ICD-10-CM

## 2018-12-16 MED ORDER — ERENUMAB-AOOE 140 MG/ML ~~LOC~~ SOAJ: 140.0000 mg | SUBCUTANEOUS | 11 refills | Status: DC

## 2018-12-17 ENCOUNTER — Ambulatory Visit (INDEPENDENT_AMBULATORY_CARE_PROVIDER_SITE_OTHER): Payer: 59 | Admitting: Family Medicine

## 2018-12-17 ENCOUNTER — Encounter (INDEPENDENT_AMBULATORY_CARE_PROVIDER_SITE_OTHER): Payer: Self-pay | Admitting: Family Medicine

## 2018-12-17 ENCOUNTER — Telehealth: Payer: Self-pay | Admitting: *Deleted

## 2018-12-17 VITALS — BP 112/76 | HR 92 | Temp 98.2°F | Ht 69.0 in | Wt 251.0 lb

## 2018-12-17 DIAGNOSIS — E88819 Insulin resistance, unspecified: Secondary | ICD-10-CM

## 2018-12-17 DIAGNOSIS — E8881 Metabolic syndrome: Secondary | ICD-10-CM

## 2018-12-17 DIAGNOSIS — Z9189 Other specified personal risk factors, not elsewhere classified: Secondary | ICD-10-CM | POA: Diagnosis not present

## 2018-12-17 DIAGNOSIS — E559 Vitamin D deficiency, unspecified: Secondary | ICD-10-CM

## 2018-12-17 DIAGNOSIS — Z6837 Body mass index (BMI) 37.0-37.9, adult: Secondary | ICD-10-CM

## 2018-12-17 DIAGNOSIS — E66812 Obesity, class 2: Secondary | ICD-10-CM

## 2018-12-17 MED ORDER — VITAMIN D (ERGOCALCIFEROL) 1.25 MG (50000 UNIT) PO CAPS: 50000.0000 [IU] | ORAL_CAPSULE | ORAL | 0 refills | Status: DC

## 2018-12-17 MED ORDER — METFORMIN HCL 500 MG PO TABS: 500.0000 mg | ORAL_TABLET | Freq: Every day | ORAL | 0 refills | Status: DC

## 2018-12-17 NOTE — Telephone Encounter (Signed)
PA for Aimovig 140mg /ml completed via Cover My Med[employer]. Key# [id number]. Dx: Chronic migraine (G43.709). Tried and failed meds: Topamax, Trokendi, Nortriptyline, Ibuprofen, Tylenol, Toradol, Frova, Medrol, Prednisone, Fioricet, Diclofenac, Maxalt, Depo-Provera/fim

## 2018-12-18 NOTE — Progress Notes (Signed)
Office: [phone]  /  Fax: [phone]   HPI:   Chief Complaint: OBESITY Miranda Hunter is here to discuss her progress with her obesity treatment plan. She is on the Category 3 plan and is following her eating plan approximately 95 % of the time. She states she is walking the dogs for 20 minutes 7 times per week. Miranda Hunter did very well with weight loss on her Category 3 plan. She had a lot of work place temptations and struggled with cravings, but her hunger improved on her plan.  Her weight is 251 lb (113.9 kg) today and has had a weight loss of 6 pounds over a period of 2 weeks since her last visit. She has lost 6 lbs since starting treatment with [place].  Vitamin D Deficiency Miranda Hunter has a new diagnosis of vitamin D deficiency. She is taking OTC multivitamins, but Vit D level is low. She notes fatigue and denies nausea, vomiting or muscle weakness.  Insulin Resistance Miranda Hunter has a new diagnosis of insulin resistance based on her elevated fasting insulin level >5. Her A1c and glucose are within normal limits, but fasting insulin is elevated. Although Miranda Hunter's blood glucose readings are still under good control, insulin resistance puts her at greater risk of metabolic syndrome and diabetes. She is not taking metformin currently and notes polyphagia, but denies hypoglycemia. She continues to work on diet and exercise to decrease risk of diabetes.  At risk for diabetes Miranda Hunter is at higher than averagerisk for developing diabetes due to her obesity and insulin resistance. She currently denies polyuria or polydipsia.  ASSESSMENT AND PLAN:  Vitamin D deficiency - Plan: Vitamin D, Ergocalciferol, (DRISDOL) 1.25 MG (50000 UT) CAPS capsule  Insulin resistance - Plan: metFORMIN (GLUCOPHAGE) 500 MG tablet  At risk for diabetes mellitus  Class 2 severe obesity with serious comorbidity and body mass index (BMI) of 37.0 to 37.9 in adult, unspecified obesity type (HCC)  PLAN:  Vitamin D  Deficiency Miranda Hunter was informed that low vitamin D levels contributes to fatigue and are associated with obesity, breast, and colon cancer. Miranda Hunter agrees to start prescription Vit D @50 ,000 IU every week #4 with no refills. She will follow up for routine testing of vitamin D, at least 2-3 times per year. She was informed of the risk of over-replacement of vitamin D and agrees to not increase her dose unless she discusses this with [place] first. Miranda Hunter agrees to follow up with our clinic in 2 to 3 weeks.  Insulin Resistance Miranda Hunter will continue to work on weight loss, diet, exercise, and decreasing simple carbohydrates in her diet to help decrease the risk of diabetes. We dicussed metformin including benefits and risks. She was informed that eating too many simple carbohydrates or too many calories at one sitting increases the likelihood of GI side effects. Miranda Hunter agrees to start metformin 500 mg q AM #30 with no refills. Miranda Hunter agrees to follow up with our clinic in 2 to 3 weeks as directed to monitor her progress.  Diabetes risk counselling Miranda Hunter was given extended (30 minutes) diabetes prevention counseling today. She is 33 y.o. female and has risk factors for diabetes including obesity and insulin resistance. We discussed intensive lifestyle modifications today with an emphasis on weight loss as well as increasing exercise and decreasing simple carbohydrates in her diet.  Obesity Miranda Hunter is currently in the action stage of change. As such, her goal is to continue with weight loss efforts She has agreed to follow the Category 3 plan Miranda Hunter  has been instructed to work up to a goal of 150 minutes of combined cardio and strengthening exercise per week for weight loss and overall health benefits. We discussed the following Behavioral Modification Strategies today: increasing lean protein intake, decreasing simple carbohydrates , work on meal planning and easy cooking plans and dealing with  family or coworker sabotage   Miranda Hunter has agreed to follow up with our clinic in 2 to 3 weeks. She was informed of the importance of frequent follow up visits to maximize her success with intensive lifestyle modifications for her multiple health conditions.  ALLERGIES: Allergies  Allergen Reactions  . Amoxicillin-Pot Clavulanate Diarrhea    MEDICATIONS: Current Outpatient Medications on File Prior to Visit  Medication Sig Dispense Refill  . albuterol (PROVENTIL HFA;VENTOLIN HFA) 108 (90 Base) MCG/ACT inhaler Inhale 2 puffs into the lungs every 6 (six) hours as needed for wheezing or shortness of breath. 1 Inhaler 2  . cetirizine (ZYRTEC) 10 MG tablet Take 10 mg by mouth daily.    Miranda Hunter (AIMOVIG) 140 MG/ML SOAJ Inject 140 mg into the skin every 30 (thirty) days. 1 pen 11  . Erenumab-aooe (AIMOVIG) 70 MG/ML SOAJ Inject 70 mg into the skin every 30 (thirty) days. 1 pen 1  . Flaxseed, Linseed, (FLAX SEEDS PO) Take 1 tablet by mouth at bedtime.    Miranda Hunter Kitchen ketorolac (TORADOL) 10 MG tablet Take 1 tablet (10 mg total) by mouth every 6 (six) hours as needed. 20 tablet 11  . Multiple Vitamin (MULTIVITAMIN) tablet Take 1 tablet by mouth daily.    . norethindrone (MICRONOR,CAMILA,ERRIN) 0.35 MG tablet Take 1 tablet by mouth daily.    . ondansetron (ZOFRAN-ODT) 4 MG disintegrating tablet Take 1 tablet (4 mg total) by mouth every 8 (eight) hours as needed for nausea. 30 tablet 3  . rizatriptan (MAXALT-MLT) 10 MG disintegrating tablet Take 1 tablet (10 mg total) by mouth as needed for migraine. May repeat in 2 hours if needed 9 tablet 11   No current facility-administered medications on file prior to visit.     PAST MEDICAL HISTORY: Past Medical History:  Diagnosis Date  . Anxiety   . Asthma   . History of chicken pox   . IBS (irritable bowel syndrome)   . Migraines     PAST SURGICAL HISTORY: Past Surgical History:  Procedure Laterality Date  . tonsillectomy and adnoidectomy Bilateral  2007    SOCIAL HISTORY: Social History   Tobacco Use  . Smoking status: Never Smoker  . Smokeless tobacco: Never Used  Substance Use Topics  . Alcohol use: Yes    Comment: socially  . Drug use: No    FAMILY HISTORY: Family History  Problem Relation Age of Onset  . Prostate cancer Father   . Hypertension Father   . Asthma Sister   . Lung cancer Maternal Grandmother        heavy smoker  . Lung cancer Maternal Grandfather        heavy smoker  . Asthma Sister     ROS: Review of Systems  Constitutional: Positive for malaise/fatigue and weight loss.  Gastrointestinal: Negative for nausea and vomiting.  Genitourinary: Negative for frequency.  Musculoskeletal:       Negative muscle weakness  Endo/Heme/Allergies: Negative for polydipsia.       Positive polyphagia Negative hypoglycemia    PHYSICAL EXAM: Blood pressure 112/76, pulse 92, temperature 98.2 F (36.8 C), temperature source Oral, height 5\' 9"  (1.753 m), weight 251 lb (113.9 kg), last menstrual  period 11/30/2018, SpO2 97 %. Body mass index is 37.07 kg/m. Physical Exam Vitals signs reviewed.  Constitutional:      Appearance: Normal appearance. She is obese.  Cardiovascular:     Rate and Rhythm: Normal rate.     Pulses: Normal pulses.  Pulmonary:     Effort: Pulmonary effort is normal.     Breath sounds: Normal breath sounds.  Musculoskeletal: Normal range of motion.  Skin:    General: Skin is warm and dry.  Neurological:     Mental Status: She is alert and oriented to person, place, and time.  Psychiatric:        Mood and Affect: Mood normal.        Behavior: Behavior normal.     RECENT LABS AND TESTS: BMET    Component Value Date/Time   NA 140 12/03/2018 1002   K 4.8 12/03/2018 1002   CL 102 12/03/2018 1002   CO2 22 12/03/2018 1002   GLUCOSE 81 12/03/2018 1002   GLUCOSE 84 09/03/2017 0923   BUN 5 (L) 12/03/2018 1002   CREATININE 0.67 12/03/2018 1002   CALCIUM 10.1 12/03/2018 1002    GFRNONAA 118 12/03/2018 1002   GFRAA 136 12/03/2018 1002   Lab Results  Component Value Date   HGBA1C 5.5 12/03/2018   Lab Results  Component Value Date   INSULIN 42.1 (H) 12/03/2018   CBC    Component Value Date/Time   WBC 9.8 12/03/2018 1002   WBC 11.7 (H) 09/03/2017 0923   RBC 4.78 12/03/2018 1002   RBC 4.71 09/03/2017 0923   HGB 13.8 12/03/2018 1002   HCT 42.9 12/03/2018 1002   PLT 445.0 (H) 09/03/2017 0923   MCV 90 12/03/2018 1002   MCH 28.9 12/03/2018 1002   MCHC 32.2 12/03/2018 1002   MCHC 32.2 09/03/2017 0923   RDW 13.1 12/03/2018 1002   LYMPHSABS 3.2 (H) 12/03/2018 1002   MONOABS 0.8 09/03/2017 0923   EOSABS 0.2 12/03/2018 1002   BASOSABS 0.0 12/03/2018 1002   Iron/TIBC/Ferritin/ %Sat No results found for: IRON, TIBC, FERRITIN, IRONPCTSAT Lipid Panel     Component Value Date/Time   CHOL 170 12/03/2018 1002   TRIG 242 (H) 12/03/2018 1002   HDL 34 (L) 12/03/2018 1002   LDLCALC 88 12/03/2018 1002   Hepatic Function Panel     Component Value Date/Time   PROT 6.9 12/03/2018 1002   ALBUMIN 4.7 12/03/2018 1002   AST 20 12/03/2018 1002   ALT 26 12/03/2018 1002   ALKPHOS 127 (H) 12/03/2018 1002   BILITOT 0.3 12/03/2018 1002      Component Value Date/Time   TSH 2.150 12/03/2018 1002   TSH 2.45 09/03/2017 0923      OBESITY BEHAVIORAL INTERVENTION VISIT  Today's visit was # 2   Starting weight: 257 lbs Starting date: 12/03/2018 Today's weight : 251 lbs Today's date: 12/17/2018 Total lbs lost to date: 6    ASK: We discussed the diagnosis of obesity with Miranda Hunter today and Miranda Hunter agreed to give us permission to discuss obesity behavioral modification therapy today.  ASSESS: Miranda Hunter has the diagnosis of obesity and her BMI today is 37.05 Miranda Hunter is in the action stage of change   ADVISE: Miranda Hunter was educated on the multiple health risks of obesity as well as the benefit of weight loss to improve her health. She was advised of the need  for long term treatment and the importance of lifestyle modifications to improve her current health and to decrease her risk  of future health problems.  AGREE: Multiple dietary modification options and treatment options were discussed and  Doloros agreed to follow the recommendations documented in the above note.  ARRANGE: Miranda Hunter was educated on the importance of frequent visits to treat obesity as outlined per CMS and USPSTF guidelines and agreed to schedule her next follow up appointment today.  I, Burt KnackSharon Martin, am acting as transcriptionist for Quillian Quincearen Wanna Gully, MD  I have reviewed the above documentation for accuracy and completeness, and I agree with the above. -Quillian Quincearen Keatyn Luck, MD

## 2018-12-22 NOTE — Telephone Encounter (Signed)
Fax received from [facility]. Aimovig denied until pt. tries/fails one more preferred (Amitriptyline, Atenolol, Metoprolol, Nadolol, Propranolol, Timolol, Depakote, Depakote ER, Venlafaxine XR, Botox).

## 2018-12-22 NOTE — Telephone Encounter (Signed)
She can use the free copay card for now, thanks

## 2018-12-22 NOTE — Telephone Encounter (Signed)
I spoke with pt. and explained ins. will not cover Aimovig. She should use the copay card for now. She verbalized understanding of same, sts. she has already registered online/fim

## 2019-01-05 ENCOUNTER — Ambulatory Visit (INDEPENDENT_AMBULATORY_CARE_PROVIDER_SITE_OTHER): Payer: 59 | Admitting: Family Medicine

## 2019-01-05 ENCOUNTER — Encounter (INDEPENDENT_AMBULATORY_CARE_PROVIDER_SITE_OTHER): Payer: Self-pay | Admitting: Family Medicine

## 2019-01-05 VITALS — BP 112/76 | HR 104 | Temp 98.3°F | Ht 69.0 in | Wt 248.0 lb

## 2019-01-05 DIAGNOSIS — Z6836 Body mass index (BMI) 36.0-36.9, adult: Secondary | ICD-10-CM

## 2019-01-05 DIAGNOSIS — Z9189 Other specified personal risk factors, not elsewhere classified: Secondary | ICD-10-CM

## 2019-01-05 DIAGNOSIS — G43809 Other migraine, not intractable, without status migrainosus: Secondary | ICD-10-CM

## 2019-01-05 DIAGNOSIS — E8881 Metabolic syndrome: Secondary | ICD-10-CM

## 2019-01-05 DIAGNOSIS — E559 Vitamin D deficiency, unspecified: Secondary | ICD-10-CM

## 2019-01-05 MED ORDER — TOPIRAMATE 25 MG PO TABS: 25.0000 mg | ORAL_TABLET | Freq: Every day | ORAL | 0 refills | Status: DC

## 2019-01-05 MED ORDER — METFORMIN HCL 500 MG PO TABS: 500.0000 mg | ORAL_TABLET | Freq: Every day | ORAL | 0 refills | Status: DC

## 2019-01-05 MED ORDER — VITAMIN D (ERGOCALCIFEROL) 1.25 MG (50000 UNIT) PO CAPS: 50000.0000 [IU] | ORAL_CAPSULE | ORAL | 0 refills | Status: DC

## 2019-01-06 NOTE — Progress Notes (Signed)
Office: [phone]  /  Fax: [phone]   HPI:   Chief Complaint: OBESITY Zinnia is here to discuss her progress with her obesity treatment plan. She is on the Category 3 plan and is following her eating plan approximately 90 % of the time. She states she is walking the dog for 20-30 minutes 7 times per week. Summerrose continues to do very well with weight loss, but is struggling with emotional eating and increased temptations.  Her weight is 248 lb (112.5 kg) today and has had a weight loss of 3 pounds over a period of 2 to 3 weeks since her last visit. She has lost 9 lbs since starting treatment with [place].  Vitamin D Deficiency Trana has a diagnosis of vitamin D deficiency. She is stable on prescription Vit D. She noted some GI upset after the 2nd dose and in the evening. She denies vomiting or muscle weakness.  Insulin Resistance Lachonda has a diagnosis of insulin resistance based on her elevated fasting insulin level >5. Although Ady's blood glucose readings are still under good control, insulin resistance puts her at greater risk of metabolic syndrome and diabetes. She is stable on metformin and notes decreased polyphagia. She continues to work on diet and exercise to decrease risk of diabetes.  At risk for diabetes Courtenay is at higher than average risk for developing diabetes due to her obesity and insulin resistance. She currently denies polyuria or polydipsia.  Migraine Headaches Shacoya noted improvement on topiramate previously, but is not on it any longer and requests restarting it.  ASSESSMENT AND PLAN:  Insulin resistance - Plan: metFORMIN (GLUCOPHAGE) 500 MG tablet  Vitamin D deficiency - Plan: Vitamin D, Ergocalciferol, (DRISDOL) 1.25 MG (50000 UT) CAPS capsule  Other migraine without status migrainosus, not intractable  At risk for diabetes mellitus  Class 2 severe obesity with serious comorbidity and body mass index (BMI) of 36.0 to 36.9 in adult,  unspecified obesity type (HCC)  PLAN:  Vitamin D Deficiency Tamilore was informed that low vitamin D levels contributes to fatigue and are associated with obesity, breast, and colon cancer. Byrnece agrees to continue taking prescription Vit D @50 ,000 IU every week #4 and we will refill for 1 month, she is to take with food in the morning. She will follow up for routine testing of vitamin D, at least 2-3 times per year. She was informed of the risk of over-replacement of vitamin D and agrees to not increase her dose unless she discusses this with [place] first. Emmelynn agrees to follow up with our clinic in 2 to 3 weeks.  Insulin Resistance Aldena will continue to work on weight loss, exercise, and decreasing simple carbohydrates in her diet to help decrease the risk of diabetes. We dicussed metformin including benefits and risks. She was informed that eating too many simple carbohydrates or too many calories at one sitting increases the likelihood of GI side effects. Breece agrees to continue taking metformin 500 mg q AM #30 and we will refill for 1 month. Aoibheann agrees to follow up with our clinic in 2 to 3 weeks as directed to monitor her progress.  Diabetes risk counselling Deidra was given extended (15 minutes) diabetes prevention counseling today. She is 33 y.o. female and has risk factors for diabetes including obesity and insulin resistance. We discussed intensive lifestyle modifications today with an emphasis on weight loss as well as increasing exercise and decreasing simple carbohydrates in her diet.  Migraine Headaches Greer agrees to start topiramate 25  mg qhs #30 with no refills. Shereen agrees to follow up with our clinic in 2 to 3 weeks.  Obesity Suad is currently in the action stage of change. As such, her goal is to continue with weight loss efforts She has agreed to follow the Category 3 plan Juhi has been instructed to work up to a goal of 150 minutes of  combined cardio and strengthening exercise per week for weight loss and overall health benefits. We discussed the following Behavioral Modification Strategies today: increasing lean protein intake, work on meal planning and easy cooking plans, dealing with family or coworker sabotage, emotional eating strategies, and no skipping meals   Wendie has agreed to follow up with our clinic in 2 to 3 weeks. She was informed of the importance of frequent follow up visits to maximize her success with intensive lifestyle modifications for her multiple health conditions.  ALLERGIES: Allergies  Allergen Reactions  . Amoxicillin-Pot Clavulanate Diarrhea    MEDICATIONS: Current Outpatient Medications on File Prior to Visit  Medication Sig Dispense Refill  . albuterol (PROVENTIL HFA;VENTOLIN HFA) 108 (90 Base) MCG/ACT inhaler Inhale 2 puffs into the lungs every 6 (six) hours as needed for wheezing or shortness of breath. 1 Inhaler 2  . cetirizine (ZYRTEC) 10 MG tablet Take 10 mg by mouth daily.    Dorise Hiss (AIMOVIG) 140 MG/ML SOAJ Inject 140 mg into the skin every 30 (thirty) days. 1 pen 11  . Flaxseed, Linseed, (FLAX SEEDS PO) Take 1 tablet by mouth at bedtime.    Marland Kitchen ketorolac (TORADOL) 10 MG tablet Take 1 tablet (10 mg total) by mouth every 6 (six) hours as needed. 20 tablet 11  . Multiple Vitamin (MULTIVITAMIN) tablet Take 1 tablet by mouth daily.    . norethindrone (MICRONOR,CAMILA,ERRIN) 0.35 MG tablet Take 1 tablet by mouth daily.    . ondansetron (ZOFRAN-ODT) 4 MG disintegrating tablet Take 1 tablet (4 mg total) by mouth every 8 (eight) hours as needed for nausea. 30 tablet 3  . rizatriptan (MAXALT-MLT) 10 MG disintegrating tablet Take 1 tablet (10 mg total) by mouth as needed for migraine. May repeat in 2 hours if needed 9 tablet 11   No current facility-administered medications on file prior to visit.     PAST MEDICAL HISTORY: Past Medical History:  Diagnosis Date  . Anxiety   .  Asthma   . History of chicken pox   . IBS (irritable bowel syndrome)   . Migraines     PAST SURGICAL HISTORY: Past Surgical History:  Procedure Laterality Date  . tonsillectomy and adnoidectomy Bilateral 2007    SOCIAL HISTORY: Social History   Tobacco Use  . Smoking status: Never Smoker  . Smokeless tobacco: Never Used  Substance Use Topics  . Alcohol use: Yes    Comment: socially  . Drug use: No    FAMILY HISTORY: Family History  Problem Relation Age of Onset  . Prostate cancer Father   . Hypertension Father   . Asthma Sister   . Lung cancer Maternal Grandmother        heavy smoker  . Lung cancer Maternal Grandfather        heavy smoker  . Asthma Sister     ROS: Review of Systems  Constitutional: Positive for weight loss.  Gastrointestinal: Negative for vomiting.  Genitourinary: Negative for frequency.  Musculoskeletal:       Negative muscle weakness  Neurological: Positive for headaches (Migraines).  Endo/Heme/Allergies: Negative for polydipsia.  Positive polyphagia    PHYSICAL EXAM: Blood pressure 112/76, pulse (!) 104, temperature 98.3 F (36.8 C), temperature source Oral, height 5\' 9"  (1.753 m), weight 248 lb (112.5 kg), SpO2 97 %. Body mass index is 36.62 kg/m. Physical Exam Vitals signs reviewed.  Constitutional:      Appearance: Normal appearance. She is obese.  Cardiovascular:     Rate and Rhythm: Normal rate.     Pulses: Normal pulses.  Pulmonary:     Effort: Pulmonary effort is normal.     Breath sounds: Normal breath sounds.  Musculoskeletal: Normal range of motion.  Skin:    General: Skin is warm and dry.  Neurological:     Mental Status: She is alert and oriented to person, place, and time.  Psychiatric:        Mood and Affect: Mood normal.        Behavior: Behavior normal.     RECENT LABS AND TESTS: BMET    Component Value Date/Time   NA 140 12/03/2018 1002   K 4.8 12/03/2018 1002   CL 102 12/03/2018 1002   CO2 22  12/03/2018 1002   GLUCOSE 81 12/03/2018 1002   GLUCOSE 84 09/03/2017 0923   BUN 5 (L) 12/03/2018 1002   CREATININE 0.67 12/03/2018 1002   CALCIUM 10.1 12/03/2018 1002   GFRNONAA 118 12/03/2018 1002   GFRAA 136 12/03/2018 1002   Lab Results  Component Value Date   HGBA1C 5.5 12/03/2018   Lab Results  Component Value Date   INSULIN 42.1 (H) 12/03/2018   CBC    Component Value Date/Time   WBC 9.8 12/03/2018 1002   WBC 11.7 (H) 09/03/2017 0923   RBC 4.78 12/03/2018 1002   RBC 4.71 09/03/2017 0923   HGB 13.8 12/03/2018 1002   HCT 42.9 12/03/2018 1002   PLT 445.0 (H) 09/03/2017 0923   MCV 90 12/03/2018 1002   MCH 28.9 12/03/2018 1002   MCHC 32.2 12/03/2018 1002   MCHC 32.2 09/03/2017 0923   RDW 13.1 12/03/2018 1002   LYMPHSABS 3.2 (H) 12/03/2018 1002   MONOABS 0.8 09/03/2017 0923   EOSABS 0.2 12/03/2018 1002   BASOSABS 0.0 12/03/2018 1002   Iron/TIBC/Ferritin/ %Sat No results found for: IRON, TIBC, FERRITIN, IRONPCTSAT Lipid Panel     Component Value Date/Time   CHOL 170 12/03/2018 1002   TRIG 242 (H) 12/03/2018 1002   HDL 34 (L) 12/03/2018 1002   LDLCALC 88 12/03/2018 1002   Hepatic Function Panel     Component Value Date/Time   PROT 6.9 12/03/2018 1002   ALBUMIN 4.7 12/03/2018 1002   AST 20 12/03/2018 1002   ALT 26 12/03/2018 1002   ALKPHOS 127 (H) 12/03/2018 1002   BILITOT 0.3 12/03/2018 1002      Component Value Date/Time   TSH 2.150 12/03/2018 1002   TSH 2.45 09/03/2017 0923      OBESITY BEHAVIORAL INTERVENTION VISIT  Today's visit was # 3   Starting weight: 257 lbs Starting date: 12/03/2018 Today's weight : 248 lbs  Today's date: 01/05/2019 Total lbs lost to date: 9    ASK: We discussed the diagnosis of obesity with [name] today and Lilliana agreed to give [place] permission to discuss obesity behavioral modification therapy today.  ASSESS: Peneloperose has the diagnosis of obesity and her BMI today is 36.61 Khira is in the action  stage of change   ADVISE: Daryah was educated on the multiple health risks of obesity as well as the benefit of weight loss  to improve her health. She was advised of the need for long term treatment and the importance of lifestyle modifications to improve her current health and to decrease her risk of future health problems.  AGREE: Multiple dietary modification options and treatment options were discussed and  Kasiyah agreed to follow the recommendations documented in the above note.  ARRANGE: [name] was educated on the importance of frequent visits to treat obesity as outlined per CMS and USPSTF guidelines and agreed to schedule her next follow up appointment today.  I, Burt KnackSharon Martin, am acting as transcriptionist for Quillian Quincearen [place], MD  I have reviewed the above documentation for accuracy and completeness, and I agree with the above. -Quillian Quincearen Glyn Gerads, MD

## 2019-01-08 ENCOUNTER — Other Ambulatory Visit (INDEPENDENT_AMBULATORY_CARE_PROVIDER_SITE_OTHER): Payer: Self-pay | Admitting: Family Medicine

## 2019-01-08 ENCOUNTER — Encounter (INDEPENDENT_AMBULATORY_CARE_PROVIDER_SITE_OTHER): Payer: Self-pay | Admitting: Family Medicine

## 2019-01-08 DIAGNOSIS — E559 Vitamin D deficiency, unspecified: Secondary | ICD-10-CM

## 2019-01-21 ENCOUNTER — Ambulatory Visit (INDEPENDENT_AMBULATORY_CARE_PROVIDER_SITE_OTHER): Payer: 59 | Admitting: Family Medicine

## 2019-01-21 ENCOUNTER — Encounter (INDEPENDENT_AMBULATORY_CARE_PROVIDER_SITE_OTHER): Payer: Self-pay | Admitting: Family Medicine

## 2019-01-21 VITALS — BP 111/78 | HR 102 | Ht 69.0 in | Wt 246.0 lb

## 2019-01-21 DIAGNOSIS — Z6836 Body mass index (BMI) 36.0-36.9, adult: Secondary | ICD-10-CM | POA: Diagnosis not present

## 2019-01-21 DIAGNOSIS — E559 Vitamin D deficiency, unspecified: Secondary | ICD-10-CM

## 2019-01-21 DIAGNOSIS — Z9189 Other specified personal risk factors, not elsewhere classified: Secondary | ICD-10-CM

## 2019-01-21 DIAGNOSIS — F3289 Other specified depressive episodes: Secondary | ICD-10-CM | POA: Diagnosis not present

## 2019-01-21 MED ORDER — LIRAGLUTIDE -WEIGHT MANAGEMENT 18 MG/3ML ~~LOC~~ SOPN: 3.0000 mg | PEN_INJECTOR | Freq: Every day | SUBCUTANEOUS | 0 refills | Status: DC

## 2019-01-21 MED ORDER — INSULIN PEN NEEDLE 32G X 4 MM MISC: 1.0000 | Freq: Two times a day (BID) | 0 refills | Status: DC

## 2019-01-21 MED ORDER — VITAMIN D 50 MCG (2000 UT) PO TABS: 2000.0000 [IU] | ORAL_TABLET | Freq: Every day | ORAL | 0 refills | Status: DC

## 2019-01-21 NOTE — Progress Notes (Signed)
Office: [phone]  /  Fax: [phone]   HPI:   Chief Complaint: OBESITY Miranda Hunter is here to discuss her progress with her obesity treatment plan. She is on the Category 3 plan and is following her eating plan approximately 85-90 % of the time. She states she is walking for 15 minutes 2 times per week. Miranda Hunter continues to lose weight, but she is struggling to control cravings and hunger. She is doing ok with Category 3 plan.  Her weight is 246 lb (111.6 kg) today and has had a weight loss of 2 pounds over a period of 2 weeks since her last visit. She has lost 11 lbs since starting treatment with [place].  Vitamin D Deficiency Miranda Hunter has a diagnosis of vitamin D deficiency. She states with taking Vit D prescription, she is still getting nausea so she stopped. She denies vomiting or muscle weakness.  At risk for osteopenia and osteoporosis Miranda Hunter is at higher risk of osteopenia and osteoporosis due to vitamin D deficiency.   Depression with emotional eating behaviors Miranda Hunter started Topamax and felt increased anxiety and no improvement in cravings so she stopped. Miranda Hunter struggles with emotional eating and using food for comfort to the extent that it is negatively impacting her health. She often snacks when she is not hungry. Miranda Hunter sometimes feels she is out of control and then feels guilty that she made poor food choices. She has been working on behavior modification techniques to help reduce her emotional eating and has been somewhat successful. She shows no sign of suicidal or homicidal ideations.  Depression screen Miranda Hunter 2/9 12/03/2018 09/03/2017  Decreased Interest 2 0  Down, Depressed, Hopeless 3 0  PHQ - 2 Score 5 0  Altered sleeping 1 -  Tired, decreased energy 3 -  Change in appetite 2 -  Feeling bad or failure about yourself  1 -  Trouble concentrating 1 -  Moving slowly or fidgety/restless 0 -  Suicidal thoughts 0 -  PHQ-9 Score 13 -  Difficult doing work/chores  Somewhat difficult -    ASSESSMENT AND PLAN:  Vitamin D deficiency - Plan: Cholecalciferol (VITAMIN D) 50 MCG (2000 UT) tablet  Other depression - with emotional eating  At risk for osteoporosis  Class 2 severe obesity with serious comorbidity and body mass index (BMI) of 36.0 to 36.9 in adult, unspecified obesity type (HCC) - Plan: Liraglutide -Weight Management (SAXENDA) 18 MG/3ML SOPN, Insulin Pen Needle (BD PEN NEEDLE NANO 2ND GEN) 32G X 4 MM MISC  PLAN:  Vitamin D Deficiency Miranda Hunter was informed that low vitamin D levels contributes to fatigue and are associated with obesity, breast, and colon cancer. Yang agrees to start Vit D 2,000 IU daily OTC and will follow up for routine testing of vitamin D, at least 2-3 times per year. She was informed of the risk of over-replacement of vitamin D and agrees to not increase her dose unless she discusses this with [place] first. We will recheck labs in 2 months. Miranda Hunter agrees to follow up with our clinic in 2 to 3 weeks.  At risk for osteopenia and osteoporosis Miranda Hunter was given extended (15 minutes) osteoporosis prevention counseling today. Miranda Hunter is at risk for osteopenia and osteoporsis due to her vitamin D deficiency. She was encouraged to take her vitamin D and follow her higher calcium diet and increase strengthening exercise to help strengthen her bones and decrease her risk of osteopenia and osteoporosis.  Depression with Emotional Eating Behaviors We discussed behavior modification techniques today  to help Miranda Hunter deal with her emotional eating and depression. Miranda Hunter agrees to stop Topamax and she agrees to follow up with our clinic in 2 to 3 weeks.  Obesity Miranda Hunter is currently in the action stage of change. As such, her goal is to continue with weight loss efforts She has agreed to follow the Category 3 plan Miranda Hunter has been instructed to work up to a goal of 150 minutes of combined cardio and strengthening exercise per  week for weight loss and overall health benefits. We discussed the following Behavioral Modification Strategies today: increasing lean protein intake and decreasing simple carbohydrates  We discussed various medication options to help Miranda Hunter with her weight loss efforts and we both agreed to start Saxenda 3.0 mg SubQ daily #5 pens with no refills and nano pen needles #100 with no refills.  Miranda Hunter has agreed to follow up with our clinic in 2 to 3 weeks. She was informed of the importance of frequent follow up visits to maximize her success with intensive lifestyle modifications for her multiple health conditions.  ALLERGIES: Allergies  Allergen Reactions  . Amoxicillin-Pot Clavulanate Diarrhea    MEDICATIONS: Current Outpatient Medications on File Prior to Visit  Medication Sig Dispense Refill  . albuterol (PROVENTIL HFA;VENTOLIN HFA) 108 (90 Base) MCG/ACT inhaler Inhale 2 puffs into the lungs every 6 (six) hours as needed for wheezing or shortness of breath. 1 Inhaler 2  . cetirizine (ZYRTEC) 10 MG tablet Take 10 mg by mouth daily.    Dorise Hiss (AIMOVIG) 140 MG/ML SOAJ Inject 140 mg into the skin every 30 (thirty) days. 1 pen 11  . Flaxseed, Linseed, (FLAX SEEDS PO) Take 1 tablet by mouth at bedtime.    Miranda Hunter Kitchen ketorolac (TORADOL) 10 MG tablet Take 1 tablet (10 mg total) by mouth every 6 (six) hours as needed. 20 tablet 11  . metFORMIN (GLUCOPHAGE) 500 MG tablet Take 1 tablet (500 mg total) by mouth daily with breakfast. 30 tablet 0  . Multiple Vitamin (MULTIVITAMIN) tablet Take 1 tablet by mouth daily.    . norethindrone (MICRONOR,CAMILA,ERRIN) 0.35 MG tablet Take 1 tablet by mouth daily.    . ondansetron (ZOFRAN-ODT) 4 MG disintegrating tablet Take 1 tablet (4 mg total) by mouth every 8 (eight) hours as needed for nausea. 30 tablet 3  . rizatriptan (MAXALT-MLT) 10 MG disintegrating tablet Take 1 tablet (10 mg total) by mouth as needed for migraine. May repeat in 2 hours if needed 9  tablet 11  . Vitamin D, Ergocalciferol, (DRISDOL) 1.25 MG (50000 UT) CAPS capsule Take 1 capsule (50,000 Units total) by mouth every 7 (seven) days. 4 capsule 0   No current facility-administered medications on file prior to visit.     PAST MEDICAL HISTORY: Past Medical History:  Diagnosis Date  . Anxiety   . Asthma   . History of chicken pox   . IBS (irritable bowel syndrome)   . Migraines     PAST SURGICAL HISTORY: Past Surgical History:  Procedure Laterality Date  . tonsillectomy and adnoidectomy Bilateral 2007    SOCIAL HISTORY: Social History   Tobacco Use  . Smoking status: Never Smoker  . Smokeless tobacco: Never Used  Substance Use Topics  . Alcohol use: Yes    Comment: socially  . Drug use: No    FAMILY HISTORY: Family History  Problem Relation Age of Onset  . Prostate cancer Father   . Hypertension Father   . Asthma Sister   . Lung cancer Maternal Grandmother  heavy smoker  . Lung cancer Maternal Grandfather        heavy smoker  . Asthma Sister     ROS: Review of Systems  Constitutional: Positive for weight loss.  Gastrointestinal: Positive for nausea. Negative for vomiting.  Musculoskeletal:       Negative muscle weakness  Psychiatric/Behavioral: Positive for depression. Negative for suicidal ideas.       + Anxiety    PHYSICAL EXAM: Blood pressure 111/78, pulse (!) 102, height  (1.753 m), weight 246 lb (111.6 kg), SpO2 97 %. Body mass index is 36.33 kg/m. Physical Exam Vitals signs reviewed.  Constitutional:      Appearance: Normal appearance. She is obese.  Cardiovascular:     Rate and Rhythm: Normal rate.     Pulses: Normal pulses.  Pulmonary:     Effort: Pulmonary effort is normal.     Breath sounds: Normal breath sounds.  Musculoskeletal: Normal range of motion.  Skin:    General: Skin is warm and dry.  Neurological:     Mental Status: She is alert and oriented to person, place, and time.  Psychiatric:        Mood  and Affect: Mood normal.        Behavior: Behavior normal.     RECENT LABS AND TESTS: BMET    Component Value Date/Time   NA 140 12/03/2018 1002   K 4.8 12/03/2018 1002   CL 102 12/03/2018 1002   CO2 22 12/03/2018 1002   GLUCOSE 81 12/03/2018 1002   GLUCOSE 84 09/03/2017 0923   BUN 5 (L) 12/03/2018 1002   CREATININE 0.67 12/03/2018 1002   CALCIUM 10.1 12/03/2018 1002   GFRNONAA 118 12/03/2018 1002   GFRAA 136 12/03/2018 1002   Lab Results  Component Value Date   HGBA1C 5.5 12/03/2018   Lab Results  Component Value Date   INSULIN 42.1 (H) 12/03/2018   CBC    Component Value Date/Time   WBC 9.8 12/03/2018 1002   WBC 11.7 (H) 09/03/2017 0923   RBC 4.78 12/03/2018 1002   RBC 4.71 09/03/2017 0923   HGB 13.8 12/03/2018 1002   HCT 42.9 12/03/2018 1002   PLT 445.0 (H) 09/03/2017 0923   MCV 90 12/03/2018 1002   MCH 28.9 12/03/2018 1002   MCHC 32.2 12/03/2018 1002   MCHC 32.2 09/03/2017 0923   RDW 13.1 12/03/2018 1002   LYMPHSABS 3.2 (H) 12/03/2018 1002   MONOABS 0.8 09/03/2017 0923   EOSABS 0.2 12/03/2018 1002   BASOSABS 0.0 12/03/2018 1002   Iron/TIBC/Ferritin/ %Sat No results found for: IRON, TIBC, FERRITIN, IRONPCTSAT Lipid Panel     Component Value Date/Time   CHOL 170 12/03/2018 1002   TRIG 242 (H) 12/03/2018 1002   HDL 34 (L) 12/03/2018 1002   LDLCALC 88 12/03/2018 1002   Hepatic Function Panel     Component Value Date/Time   PROT 6.9 12/03/2018 1002   ALBUMIN 4.7 12/03/2018 1002   AST 20 12/03/2018 1002   ALT 26 12/03/2018 1002   ALKPHOS 127 (H) 12/03/2018 1002   BILITOT 0.3 12/03/2018 1002      Component Value Date/Time   TSH 2.150 12/03/2018 1002   TSH 2.45 09/03/2017 0923      OBESITY BEHAVIORAL INTERVENTION VISIT  Today's visit was # 4   Starting weight: 257 lbs Starting date: 12/03/2018 Today's weight : 246 lbs  Today's date: 01/21/2019 Total lbs lost to date: 11    01/21/2019  Height  (1.753 m)  Weight 246 lb (111.6 kg)    BMI (Calculated) 36.31  BLOOD PRESSURE - SYSTOLIC 111  BLOOD PRESSURE - DIASTOLIC 78   Body Fat % 41.5 %  Total Body Water (lbs) 91.6 lbs     ASK: We discussed the diagnosis of obesity with [name] today and Symiah agreed to give [place] permission to discuss obesity behavioral modification therapy today.  ASSESS: Ryneisha has the diagnosis of obesity and her BMI today is 36.31 Kyi is in the action stage of change   ADVISE: Bernelda was educated on the multiple health risks of obesity as well as the benefit of weight loss to improve her health. She was advised of the need for long term treatment and the importance of lifestyle modifications to improve her current health and to decrease her risk of future health problems.  AGREE: Multiple dietary modification options and treatment options were discussed and  Hellen agreed to follow the recommendations documented in the above note.  ARRANGE: Pricsilla was educated on the importance of frequent visits to treat obesity as outlined per CMS and USPSTF guidelines and agreed to schedule her next follow up appointment today.  I, Burt Knack, am acting as transcriptionist for Quillian Quince, MD  I have reviewed the above documentation for accuracy and completeness, and I agree with the above. -Quillian Quince, MD

## 2019-02-11 ENCOUNTER — Encounter (INDEPENDENT_AMBULATORY_CARE_PROVIDER_SITE_OTHER): Payer: Self-pay | Admitting: Family Medicine

## 2019-02-11 ENCOUNTER — Other Ambulatory Visit: Payer: Self-pay

## 2019-02-11 ENCOUNTER — Ambulatory Visit (INDEPENDENT_AMBULATORY_CARE_PROVIDER_SITE_OTHER): Payer: 59 | Admitting: Family Medicine

## 2019-02-11 VITALS — BP 106/72 | HR 70 | Ht 69.0 in | Wt 241.0 lb

## 2019-02-11 DIAGNOSIS — Z711 Person with feared health complaint in whom no diagnosis is made: Secondary | ICD-10-CM

## 2019-02-11 DIAGNOSIS — E8881 Metabolic syndrome: Secondary | ICD-10-CM | POA: Diagnosis not present

## 2019-02-11 DIAGNOSIS — Z9189 Other specified personal risk factors, not elsewhere classified: Secondary | ICD-10-CM | POA: Diagnosis not present

## 2019-02-11 DIAGNOSIS — Z6835 Body mass index (BMI) 35.0-35.9, adult: Secondary | ICD-10-CM

## 2019-02-11 NOTE — Progress Notes (Signed)
Office: [phone]  /  Fax: [phone]   HPI:   Chief Complaint: OBESITY Miranda Hunter is here to discuss her progress with her obesity treatment plan. She is on the Category 3 plan and is following her eating plan approximately 75 % of the time. She states she is walking 30 to 45 minutes 7 times per week. Miranda Hunter continues to do very well with weight loss even though she is now working from home. She started a very low dose of Saxenda (0.3 mg) and denies GI upset.  Her weight is 241 lb (109.3 kg) today and has had a weight loss of 5 pounds over a period of 3 weeks since her last visit. She has lost 16 lbs since starting treatment with [place].  Pre-Diabetes Miranda Hunter has a diagnosis of pre-diabetes based on her elevated Hgb A1c and was informed this puts her at greater risk of developing diabetes. She is stable on metformin and liraglutide currently and continues to work on diet and exercise to decrease risk of diabetes. She notes decreased hypoglycemia. Miranda Hunter denies nausea and vomiting.  At risk for diabetes Miranda Hunter is at higher than average risk for developing diabetes due to her pre-diabetes and obesity. She currently denies polyuria or polydipsia.  Worried Well Miranda Hunter has questions about how to socially distance in certain situations.  ASSESSMENT AND PLAN:  Insulin resistance - Plan: metFORMIN (GLUCOPHAGE) 500 MG tablet  Worried well  At risk for diabetes mellitus  Class 2 severe obesity with serious comorbidity and body mass index (BMI) of 35.0 to 35.9 in adult, unspecified obesity type Miranda Hunter)  PLAN:  Pre-Diabetes Miranda Hunter will continue to work on weight loss, exercise, and decreasing simple carbohydrates in her diet to help decrease the risk of diabetes. She was informed that eating too many simple carbohydrates or too many calories at one sitting increases the likelihood of GI side effects. Miranda Hunter agreed to continue metformin 500 mg qAM #30 with no refills and a  prescription was written today. Miranda Hunter agreed to follow up with [place] as directed to monitor her progress in 2 weeks.   Diabetes risk counseling Miranda Hunter was given extended (15 minutes) diabetes prevention counseling today. She is 33 y.o. female and has risk factors for diabetes including pre-diabetes and obesity. We discussed intensive lifestyle modifications today with an emphasis on weight loss as well as increasing exercise and decreasing simple carbohydrates in her diet.  Worried Well Miranda Hunter was educated on the need for social distancing, no travel, and hand washing/hand sanitizing. We discussed symptoms of COVID19 and we discussed the possible need for sequestration and how to deal with this if it happens. Miranda Hunter was offered guidance and reassurance.  Obesity Miranda Hunter is currently in the action stage of change. As such, her goal is to continue with weight loss efforts. She has agreed to follow the Category 3 plan. Miranda Hunter has been instructed to work up to a goal of 150 minutes of combined cardio and strengthening exercise per week for weight loss and overall health benefits. We discussed the following Behavioral Modification Strategies today: increasing lean protein intake, decreasing simple carbohydrates, better snacking choices, work on meal planning and easy cooking plans, emotional eating strategies, ways to avoid boredom eating, and ways to avoid night time snacking. Miranda Hunter agreed to increase Saxenda to 0.6 mg and to continue her diet and exercise.  Miranda Hunter has agreed to follow up with our clinic in 2 weeks. She was informed of the importance of frequent follow up visits to maximize her success  with intensive lifestyle modifications for her multiple health conditions.  ALLERGIES: Allergies  Allergen Reactions  . Amoxicillin-Pot Clavulanate Diarrhea    MEDICATIONS: Current Outpatient Medications on File Prior to Visit  Medication Sig Dispense Refill  . albuterol (PROVENTIL  HFA;VENTOLIN HFA) 108 (90 Base) MCG/ACT inhaler Inhale 2 puffs into the lungs every 6 (six) hours as needed for wheezing or shortness of breath. 1 Inhaler 2  . cetirizine (ZYRTEC) 10 MG tablet Take 10 mg by mouth daily.    . Cholecalciferol (VITAMIN D) 50 MCG (2000 UT) tablet Take 1 tablet (2,000 Units total) by mouth daily. 30 tablet 0  . Erenumab-aooe (AIMOVIG) 140 MG/ML SOAJ Inject 140 mg into the skin every 30 (thirty) days. 1 pen 11  . Flaxseed, Linseed, (FLAX SEEDS PO) Take 1 tablet by mouth at bedtime.    . Insulin Pen Needle (BD PEN NEEDLE NANO 2ND GEN) 32G X 4 MM MISC 1 Package by Does not apply route 2 (two) times daily. 100 each 0  . ketorolac (TORADOL) 10 MG tablet Take 1 tablet (10 mg total) by mouth every 6 (six) hours as needed. 20 tablet 11  . Liraglutide -Weight Management (SAXENDA) 18 MG/3ML SOPN Inject 3 mg into the skin daily. 5 pen 0  . metFORMIN (GLUCOPHAGE) 500 MG tablet Take 1 tablet (500 mg total) by mouth daily with breakfast. 30 tablet 0  . Multiple Vitamin (MULTIVITAMIN) tablet Take 1 tablet by mouth daily.    . norethindrone (MICRONOR,Miranda Hunter,ERRIN) 0.35 MG tablet Take 1 tablet by mouth daily.    . ondansetron (ZOFRAN-ODT) 4 MG disintegrating tablet Take 1 tablet (4 mg total) by mouth every 8 (eight) hours as needed for nausea. 30 tablet 3  . rizatriptan (MAXALT-MLT) 10 MG disintegrating tablet Take 1 tablet (10 mg total) by mouth as needed for migraine. May repeat in 2 hours if needed 9 tablet 11   No current facility-administered medications on file prior to visit.     PAST MEDICAL HISTORY: Past Medical History:  Diagnosis Date  . Anxiety   . Asthma   . History of chicken pox   . IBS (irritable bowel syndrome)   . Migraines     PAST SURGICAL HISTORY: Past Surgical History:  Procedure Laterality Date  . tonsillectomy and adnoidectomy Bilateral 2007    SOCIAL HISTORY: Social History   Tobacco Use  . Smoking status: Never Smoker  . Smokeless tobacco:  Never Used  Substance Use Topics  . Alcohol use: Yes    Comment: socially  . Drug use: No    FAMILY HISTORY: Family History  Problem Relation Age of Onset  . Prostate cancer Father   . Hypertension Father   . Asthma Sister   . Lung cancer Maternal Grandmother        heavy smoker  . Lung cancer Maternal Grandfather        heavy smoker  . Asthma Sister    ROS: Review of Systems  Constitutional: Positive for weight loss.  Gastrointestinal: Negative for nausea and vomiting.  Endo/Heme/Allergies:       Negative for hypoglycemia.   PHYSICAL EXAM: Blood pressure 106/72, pulse 70, height 5\' 9"  (1.753 m), weight 241 lb (109.3 kg), SpO2 96 %. Body mass index is 35.59 kg/m. Physical Exam Vitals signs reviewed.  Constitutional:      Appearance: Normal appearance. She is obese.  Cardiovascular:     Rate and Rhythm: Normal rate.  Pulmonary:     Effort: Pulmonary effort is normal.  Musculoskeletal:  Normal range of motion.  Skin:    General: Skin is warm and dry.  Neurological:     Mental Status: She is alert and oriented to person, place, and time.  Psychiatric:        Mood and Affect: Mood normal.        Behavior: Behavior normal.    RECENT LABS AND TESTS: BMET    Component Value Date/Time   NA 140 12/03/2018 1002   K 4.8 12/03/2018 1002   CL 102 12/03/2018 1002   CO2 22 12/03/2018 1002   GLUCOSE 81 12/03/2018 1002   GLUCOSE 84 09/03/2017 0923   BUN 5 (L) 12/03/2018 1002   CREATININE 0.67 12/03/2018 1002   CALCIUM 10.1 12/03/2018 1002   GFRNONAA 118 12/03/2018 1002   GFRAA 136 12/03/2018 1002   Lab Results  Component Value Date   HGBA1C 5.5 12/03/2018   Lab Results  Component Value Date   INSULIN 42.1 (H) 12/03/2018   CBC    Component Value Date/Time   WBC 9.8 12/03/2018 1002   WBC 11.7 (H) 09/03/2017 0923   RBC 4.78 12/03/2018 1002   RBC 4.71 09/03/2017 0923   HGB 13.8 12/03/2018 1002   HCT 42.9 12/03/2018 1002   PLT 445.0 (H) 09/03/2017 0923   MCV  90 12/03/2018 1002   MCH 28.9 12/03/2018 1002   MCHC 32.2 12/03/2018 1002   MCHC 32.2 09/03/2017 0923   RDW 13.1 12/03/2018 1002   LYMPHSABS 3.2 (H) 12/03/2018 1002   MONOABS 0.8 09/03/2017 0923   EOSABS 0.2 12/03/2018 1002   BASOSABS 0.0 12/03/2018 1002   Iron/TIBC/Ferritin/ %Sat No results found for: IRON, TIBC, FERRITIN, IRONPCTSAT Lipid Panel     Component Value Date/Time   CHOL 170 12/03/2018 1002   TRIG 242 (H) 12/03/2018 1002   HDL 34 (L) 12/03/2018 1002   LDLCALC 88 12/03/2018 1002   Hepatic Function Panel     Component Value Date/Time   PROT 6.9 12/03/2018 1002   ALBUMIN 4.7 12/03/2018 1002   AST 20 12/03/2018 1002   ALT 26 12/03/2018 1002   ALKPHOS 127 (H) 12/03/2018 1002   BILITOT 0.3 12/03/2018 1002      Component Value Date/Time   TSH 2.150 12/03/2018 1002   TSH 2.45 09/03/2017 0923   Results for RANELL, FINELLI (MRN [id number]) as of 02/11/2019 16:27  Ref. Range 12/03/2018 10:02  Vitamin D, 25-Hydroxy Latest Ref Range: 30.0 - 100.0 ng/mL 25.8 (L)   OBESITY BEHAVIORAL INTERVENTION VISIT  Today's visit was # 5  Starting weight: 257 lbs Starting date: 12/03/18 Today's weight : Weight: 241 lb (109.3 kg)  Today's date: 02/11/2019 Total lbs lost to date: 16    02/11/2019  Height  (1.753 m)  Weight 241 lb (109.3 kg)  BMI (Calculated) 35.57  BLOOD PRESSURE - SYSTOLIC 106  BLOOD PRESSURE - DIASTOLIC 72   Body Fat % 41.3 %  Total Body Water (lbs) 89.6 lbs   ASK: We discussed the diagnosis of obesity with Miranda Hunter today and Miranda Hunter agreed to give [place] permission to discuss obesity behavioral modification therapy today.  ASSESS: Miranda Hunter has the diagnosis of obesity and her BMI today is 35.57. Miranda Hunter is in the action stage of change.   ADVISE: Miranda Hunter was educated on the multiple health risks of obesity as well as the benefit of weight loss to improve her health. She was advised of the need for long term treatment and the importance of  lifestyle modifications to improve her current health  and to decrease her risk of future health problems.  AGREE: Multiple dietary modification options and treatment options were discussed and Yasaman agreed to follow the recommendations documented in the above note.  ARRANGE: Miranda Hunter was educated on the importance of frequent visits to treat obesity as outlined per CMS and USPSTF guidelines and agreed to schedule her next follow up appointment today.  IKirke Corin, CMA, am acting as transcriptionist for Wilder Glade, MD  I have reviewed the above documentation for accuracy and completeness, and I agree with the above. -Quillian Quince, MD

## 2019-02-12 MED ORDER — METFORMIN HCL 500 MG PO TABS: 500.0000 mg | ORAL_TABLET | Freq: Every day | ORAL | 0 refills | Status: DC

## 2019-02-17 ENCOUNTER — Encounter (INDEPENDENT_AMBULATORY_CARE_PROVIDER_SITE_OTHER): Payer: Self-pay

## 2019-02-19 ENCOUNTER — Other Ambulatory Visit (INDEPENDENT_AMBULATORY_CARE_PROVIDER_SITE_OTHER): Payer: Self-pay | Admitting: Family Medicine

## 2019-02-19 DIAGNOSIS — Z6836 Body mass index (BMI) 36.0-36.9, adult: Principal | ICD-10-CM

## 2019-02-25 ENCOUNTER — Ambulatory Visit (INDEPENDENT_AMBULATORY_CARE_PROVIDER_SITE_OTHER): Payer: 59 | Admitting: Family Medicine

## 2019-02-25 ENCOUNTER — Encounter (INDEPENDENT_AMBULATORY_CARE_PROVIDER_SITE_OTHER): Payer: Self-pay | Admitting: Family Medicine

## 2019-02-25 ENCOUNTER — Other Ambulatory Visit: Payer: Self-pay

## 2019-02-25 DIAGNOSIS — E8881 Metabolic syndrome: Secondary | ICD-10-CM | POA: Diagnosis not present

## 2019-02-25 DIAGNOSIS — Z6835 Body mass index (BMI) 35.0-35.9, adult: Secondary | ICD-10-CM | POA: Diagnosis not present

## 2019-02-26 NOTE — Progress Notes (Signed)
Office: [phone]  /  Fax: [phone] TeleHealth Visit:  [name] has verbally consented to this TeleHealth visit today. The patient is located at home, the provider is located at the [facility] office. The participants in this visit include the listed provider and patient. Miranda Hunter was unable to use realtime audiovisual technology today and the telehealth visit was conducted via telephone.   HPI:   Chief Complaint: OBESITY Miranda Hunter is here to discuss her progress with her obesity treatment plan. She is on the Category 3 plan and is following her eating plan approximately 85 % of the time. She states she is walking the dog 90 minutes 7 times per week. Miranda Hunter has been working from home and is struggling to follow her plan especially for lunch. She states her hunger is not always controlled. She is on Saxenda.  We were unable to weigh the patient today for this TeleHealth visit. She feels as if she has maintained her weight since her last visit. She has lost 16 lbs since starting treatment with [place].  Insulin Resistance Miranda Hunter has a diagnosis of insulin resistance based on her elevated fasting insulin level >5. Although Miranda Hunter's blood glucose readings are still under good control, insulin resistance puts her at greater risk of metabolic syndrome and diabetes. She is doing well on Liraglutide, but she still notes some polyphagia especially early in the afternoon. She continues to work on diet and exercise to decrease risk of diabetes.  ASSESSMENT AND PLAN:  Insulin resistance  Class 2 severe obesity with serious comorbidity and body mass index (BMI) of 35.0 to 35.9 in adult, unspecified obesity type (HCC)  PLAN:  Insulin Resistance Miranda Hunter will continue to work on weight loss, diet, exercise, and decreasing simple carbohydrates in her diet to help decrease the risk of diabetes. We dicussed metformin including benefits and risks. She was informed that eating  too many simple carbohydrates or too many calories at one sitting increases the likelihood of GI side effects. Miranda Hunter agrees to increase Liraglutide to 0.9 (5 click past 0.6) and will continue to monitor, and she will continue diet and exercise. Miranda Hunter agrees to follow up with our clinic in 2 weeks as directed to monitor her progress.  I spent > than 50% of the 25 minute visit on counseling as documented in the note.  Obesity Miranda Hunter is currently in the action stage of change. As such, her goal is to continue with weight loss efforts She has agreed to keep a food journal with 400-600 calories and 35+ grams of protein at lunch daily and follow the Category 3 plan Miranda Hunter has been instructed to work up to a goal of 150 minutes of combined cardio and strengthening exercise per week for weight loss and overall health benefits. We discussed the following Behavioral Modification Strategies today: work on meal planning and easy cooking plans, emotional eating strategies, ways to avoid boredom eating and ways to avoid night time snacking We discussed various medication options to help Miranda Hunter with her weight loss efforts and we both agreed to continue Saxenda.  Miranda Hunter has agreed to follow up with our clinic in 2 weeks. She was informed of the importance of frequent follow up visits to maximize her success with intensive lifestyle modifications for her multiple health conditions.  ALLERGIES: Allergies  Allergen Reactions  . Amoxicillin-Pot Clavulanate Diarrhea    MEDICATIONS: Current Outpatient Medications on File Prior to Visit  Medication Sig Dispense Refill  . albuterol (PROVENTIL HFA;VENTOLIN HFA) 108 (90 Base)  MCG/ACT inhaler Inhale 2 puffs into the lungs every 6 (six) hours as needed for wheezing or shortness of breath. 1 Inhaler 2  . cetirizine (ZYRTEC) 10 MG tablet Take 10 mg by mouth daily.    . Cholecalciferol (VITAMIN D) 50 MCG (2000 UT) tablet Take 1 tablet (2,000 Units total) by  mouth daily. 30 tablet 0  . Erenumab-aooe (AIMOVIG) 140 MG/ML SOAJ Inject 140 mg into the skin every 30 (thirty) days. 1 pen 11  . Flaxseed, Linseed, (FLAX SEEDS PO) Take 1 tablet by mouth at bedtime.    . Insulin Pen Needle (BD PEN NEEDLE NANO 2ND GEN) 32G X 4 MM MISC 1 Package by Does not apply route 2 (two) times daily. 100 each 0  . ketorolac (TORADOL) 10 MG tablet Take 1 tablet (10 mg total) by mouth every 6 (six) hours as needed. 20 tablet 11  . Liraglutide -Weight Management (SAXENDA) 18 MG/3ML SOPN Inject 3 mg into the skin daily. 5 pen 0  . metFORMIN (GLUCOPHAGE) 500 MG tablet Take 1 tablet (500 mg total) by mouth daily with breakfast. 30 tablet 0  . Multiple Vitamin (MULTIVITAMIN) tablet Take 1 tablet by mouth daily.    . norethindrone (MICRONOR,CAMILA,ERRIN) 0.35 MG tablet Take 1 tablet by mouth daily.    . ondansetron (ZOFRAN-ODT) 4 MG disintegrating tablet Take 1 tablet (4 mg total) by mouth every 8 (eight) hours as needed for nausea. 30 tablet 3  . rizatriptan (MAXALT-MLT) 10 MG disintegrating tablet Take 1 tablet (10 mg total) by mouth as needed for migraine. May repeat in 2 hours if needed 9 tablet 11   No current facility-administered medications on file prior to visit.     PAST MEDICAL HISTORY: Past Medical History:  Diagnosis Date  . Anxiety   . Asthma   . History of chicken pox   . IBS (irritable bowel syndrome)   . Migraines     PAST SURGICAL HISTORY: Past Surgical History:  Procedure Laterality Date  . tonsillectomy and adnoidectomy Bilateral 2007    SOCIAL HISTORY: Social History   Tobacco Use  . Smoking status: Never Smoker  . Smokeless tobacco: Never Used  Substance Use Topics  . Alcohol use: Yes    Comment: socially  . Drug use: No    FAMILY HISTORY: Family History  Problem Relation Age of Onset  . Prostate cancer Father   . Hypertension Father   . Asthma Sister   . Lung cancer Maternal Grandmother        heavy smoker  . Lung cancer  Maternal Grandfather        heavy smoker  . Asthma Sister     ROS: Review of Systems  Constitutional: Negative for weight loss.  Gastrointestinal: Negative for nausea and vomiting.  Endo/Heme/Allergies:       Negative hypoglycemia    PHYSICAL EXAM: Pt in no acute distress  RECENT LABS AND TESTS: BMET    Component Value Date/Time   NA 140 12/03/2018 1002   K 4.8 12/03/2018 1002   CL 102 12/03/2018 1002   CO2 22 12/03/2018 1002   GLUCOSE 81 12/03/2018 1002   GLUCOSE 84 09/03/2017 0923   BUN 5 (L) 12/03/2018 1002   CREATININE 0.67 12/03/2018 1002   CALCIUM 10.1 12/03/2018 1002   GFRNONAA 118 12/03/2018 1002   GFRAA 136 12/03/2018 1002   Lab Results  Component Value Date   HGBA1C 5.5 12/03/2018   Lab Results  Component Value Date   INSULIN 42.1 (H) 12/03/2018  CBC    Component Value Date/Time   WBC 9.8 12/03/2018 1002   WBC 11.7 (H) 09/03/2017 0923   RBC 4.78 12/03/2018 1002   RBC 4.71 09/03/2017 0923   HGB 13.8 12/03/2018 1002   HCT 42.9 12/03/2018 1002   PLT 445.0 (H) 09/03/2017 0923   MCV 90 12/03/2018 1002   MCH 28.9 12/03/2018 1002   MCHC 32.2 12/03/2018 1002   MCHC 32.2 09/03/2017 0923   RDW 13.1 12/03/2018 1002   LYMPHSABS 3.2 (H) 12/03/2018 1002   MONOABS 0.8 09/03/2017 0923   EOSABS 0.2 12/03/2018 1002   BASOSABS 0.0 12/03/2018 1002   Iron/TIBC/Ferritin/ %Sat No results found for: IRON, TIBC, FERRITIN, IRONPCTSAT Lipid Panel     Component Value Date/Time   CHOL 170 12/03/2018 1002   TRIG 242 (H) 12/03/2018 1002   HDL 34 (L) 12/03/2018 1002   LDLCALC 88 12/03/2018 1002   Hepatic Function Panel     Component Value Date/Time   PROT 6.9 12/03/2018 1002   ALBUMIN 4.7 12/03/2018 1002   AST 20 12/03/2018 1002   ALT 26 12/03/2018 1002   ALKPHOS 127 (H) 12/03/2018 1002   BILITOT 0.3 12/03/2018 1002      Component Value Date/Time   TSH 2.150 12/03/2018 1002   TSH 2.45 09/03/2017 0923      I, Burt Knack, am acting as  transcriptionist for Quillian Quince, MD I have reviewed the above documentation for accuracy and completeness, and I agree with the above. -Quillian Quince, MD

## 2019-03-10 ENCOUNTER — Other Ambulatory Visit (INDEPENDENT_AMBULATORY_CARE_PROVIDER_SITE_OTHER): Payer: Self-pay | Admitting: Family Medicine

## 2019-03-10 DIAGNOSIS — E8881 Metabolic syndrome: Secondary | ICD-10-CM

## 2019-03-11 ENCOUNTER — Ambulatory Visit (INDEPENDENT_AMBULATORY_CARE_PROVIDER_SITE_OTHER): Payer: 59 | Admitting: Family Medicine

## 2019-03-11 ENCOUNTER — Other Ambulatory Visit: Payer: Self-pay

## 2019-03-11 DIAGNOSIS — F3289 Other specified depressive episodes: Secondary | ICD-10-CM

## 2019-03-11 DIAGNOSIS — E8881 Metabolic syndrome: Secondary | ICD-10-CM

## 2019-03-11 DIAGNOSIS — Z6835 Body mass index (BMI) 35.0-35.9, adult: Secondary | ICD-10-CM

## 2019-03-11 MED ORDER — BUPROPION HCL ER (SR) 150 MG PO TB12: 150.0000 mg | ORAL_TABLET | Freq: Every day | ORAL | 0 refills | Status: DC

## 2019-03-11 MED ORDER — METFORMIN HCL 500 MG PO TABS: 500.0000 mg | ORAL_TABLET | Freq: Every day | ORAL | 0 refills | Status: DC

## 2019-03-12 NOTE — Progress Notes (Signed)
Office: [phone]  /  Fax: [phone] TeleHealth Visit:  Miranda Hunter has verbally consented to this TeleHealth visit today. The patient is located at home, the provider is located at the [facility] office. The participants in this visit include the listed provider and patient and any and all parties involved. The visit was conducted today via FaceTime.  HPI:   Chief Complaint: OBESITY Miranda Hunter is here to discuss her progress with her obesity treatment plan. She is on the Category 3 plan and is following her eating plan approximately 90 % of the time. She states she is walking 60 minutes 7 times per week. Miranda Hunter is doing well on her Category 3 plan. She feels she has lost another 2 pounds since her last visit. Hunger is controlled on Saxenda at 0.6 mg. Miranda Hunter denies nausea, vomiting or hypoglycemia. We were unable to weigh the patient today for this TeleHealth visit. She feels as if she has lost weight since her last visit. She has lost 18 lbs since starting treatment with [place].  Insulin Resistance Miranda Hunter has a diagnosis of insulin resistance based on her elevated fasting insulin level >5. Although Miranda Hunter's blood glucose readings are still under good control, insulin resistance puts her at greater risk of metabolic syndrome and diabetes. She is stable on metformin. She is working on diet and exercise to decrease risk of diabetes. She denies nausea, vomiting or hypoglycemia.  Depression with emotional eating behaviors Miranda Hunter's mood has decreased and has been worse this last week. She is feeling isolated and bored. She is feeling irritable and she is not sleeping as well. She has been working on behavior modification techniques to help reduce her emotional eating and has been somewhat successful. She shows no sign of suicidal or homicidal ideations.  Depression screen [facility] 2/9 12/03/2018 09/03/2017  Decreased Interest 2 0  Down, Depressed, Hopeless 3 0  PHQ - 2 Score  5 0  Altered sleeping 1 -  Tired, decreased energy 3 -  Change in appetite 2 -  Feeling bad or failure about yourself  1 -  Trouble concentrating 1 -  Moving slowly or fidgety/restless 0 -  Suicidal thoughts 0 -  PHQ-9 Score 13 -  Difficult doing work/chores Somewhat difficult -    ASSESSMENT AND PLAN:  Insulin resistance - Plan: metFORMIN (GLUCOPHAGE) 500 MG tablet  Other depression - with emotional eating - Plan: buPROPion (WELLBUTRIN SR) 150 MG 12 hr tablet  Class 2 severe obesity with serious comorbidity and body mass index (BMI) of 35.0 to 35.9 in adult, unspecified obesity type (HCC)  PLAN:  Insulin Resistance Miranda Hunter will continue to work on weight loss, exercise, and decreasing simple carbohydrates in her diet to help decrease the risk of diabetes. We dicussed metformin including benefits and risks. She was informed that eating too many simple carbohydrates or too many calories at one sitting increases the likelihood of GI side effects. Miranda Hunter agreed to continue metformin 500 mg daily with breakfast #30 with no refills and follow up with [place] as directed to monitor her progress.  Depression with Emotional Eating Behaviors We discussed behavior modification techniques today to help Miranda Hunter deal with her emotional eating and depression. She has agreed to start Wellbutrin SR 150 mg qAM #30 with no refills and follow up as directed.  Obesity Miranda Hunter is currently in the action stage of change. As such, her goal is to continue with weight loss efforts She has agreed to follow the Category 3 plan Miranda Hunter has  been instructed to work up to a goal of 150 minutes of combined cardio and strengthening exercise per week for weight loss and overall health benefits. We discussed the following Behavioral Modification Strategies today: keeping healthy foods in the home, work on meal planning and easy cooking plans, emotional eating strategies and ways to avoid boredom eating We discussed  various medication options to help Miranda Hunter with her weight loss efforts and we both agreed to continue Saxenda at 0.6 mg.  Miranda Hunter has agreed to follow up with our clinic in 3 weeks. She was informed of the importance of frequent follow up visits to maximize her success with intensive lifestyle modifications for her multiple health conditions.  ALLERGIES: Allergies  Allergen Reactions  . Amoxicillin-Pot Clavulanate Diarrhea    MEDICATIONS: Current Outpatient Medications on File Prior to Visit  Medication Sig Dispense Refill  . albuterol (PROVENTIL HFA;VENTOLIN HFA) 108 (90 Base) MCG/ACT inhaler Inhale 2 puffs into the lungs every 6 (six) hours as needed for wheezing or shortness of breath. 1 Inhaler 2  . cetirizine (ZYRTEC) 10 MG tablet Take 10 mg by mouth daily.    . Cholecalciferol (VITAMIN D) 50 MCG (2000 UT) tablet Take 1 tablet (2,000 Units total) by mouth daily. 30 tablet 0  . Erenumab-aooe (AIMOVIG) 140 MG/ML SOAJ Inject 140 mg into the skin every 30 (thirty) days. 1 pen 11  . Flaxseed, Linseed, (FLAX SEEDS PO) Take 1 tablet by mouth at bedtime.    . Insulin Pen Needle (BD PEN NEEDLE NANO 2ND GEN) 32G X 4 MM MISC 1 Package by Does not apply route 2 (two) times daily. 100 each 0  . ketorolac (TORADOL) 10 MG tablet Take 1 tablet (10 mg total) by mouth every 6 (six) hours as needed. 20 tablet 11  . Liraglutide -Weight Management (SAXENDA) 18 MG/3ML SOPN Inject 3 mg into the skin daily. 5 pen 0  . Multiple Vitamin (MULTIVITAMIN) tablet Take 1 tablet by mouth daily.    . norethindrone (MICRONOR,CAMILA,ERRIN) 0.35 MG tablet Take 1 tablet by mouth daily.    . ondansetron (ZOFRAN-ODT) 4 MG disintegrating tablet Take 1 tablet (4 mg total) by mouth every 8 (eight) hours as needed for nausea. 30 tablet 3  . rizatriptan (MAXALT-MLT) 10 MG disintegrating tablet Take 1 tablet (10 mg total) by mouth as needed for migraine. May repeat in 2 hours if needed 9 tablet 11   No current  facility-administered medications on file prior to visit.     PAST MEDICAL HISTORY: Past Medical History:  Diagnosis Date  . Anxiety   . Asthma   . History of chicken pox   . IBS (irritable bowel syndrome)   . Migraines     PAST SURGICAL HISTORY: Past Surgical History:  Procedure Laterality Date  . tonsillectomy and adnoidectomy Bilateral 2007    SOCIAL HISTORY: Social History   Tobacco Use  . Smoking status: Never Smoker  . Smokeless tobacco: Never Used  Substance Use Topics  . Alcohol use: Yes    Comment: socially  . Drug use: No    FAMILY HISTORY: Family History  Problem Relation Age of Onset  . Prostate cancer Father   . Hypertension Father   . Asthma Sister   . Lung cancer Maternal Grandmother        heavy smoker  . Lung cancer Maternal Grandfather        heavy smoker  . Asthma Sister     ROS: Review of Systems  Constitutional: Positive for weight loss.  Gastrointestinal: Negative for nausea and vomiting.  Endo/Heme/Allergies:       Negative for hypoglycemia  Psychiatric/Behavioral: Positive for depression. Negative for suicidal ideas.       Positive for irritability    PHYSICAL EXAM: Pt in no acute distress  RECENT LABS AND TESTS: BMET    Component Value Date/Time   NA 140 12/03/2018 1002   K 4.8 12/03/2018 1002   CL 102 12/03/2018 1002   CO2 22 12/03/2018 1002   GLUCOSE 81 12/03/2018 1002   GLUCOSE 84 09/03/2017 0923   BUN 5 (L) 12/03/2018 1002   CREATININE 0.67 12/03/2018 1002   CALCIUM 10.1 12/03/2018 1002   GFRNONAA 118 12/03/2018 1002   GFRAA 136 12/03/2018 1002   Lab Results  Component Value Date   HGBA1C 5.5 12/03/2018   Lab Results  Component Value Date   INSULIN 42.1 (H) 12/03/2018   CBC    Component Value Date/Time   WBC 9.8 12/03/2018 1002   WBC 11.7 (H) 09/03/2017 0923   RBC 4.78 12/03/2018 1002   RBC 4.71 09/03/2017 0923   HGB 13.8 12/03/2018 1002   HCT 42.9 12/03/2018 1002   PLT 445.0 (H) 09/03/2017 0923    MCV 90 12/03/2018 1002   MCH 28.9 12/03/2018 1002   MCHC 32.2 12/03/2018 1002   MCHC 32.2 09/03/2017 0923   RDW 13.1 12/03/2018 1002   LYMPHSABS 3.2 (H) 12/03/2018 1002   MONOABS 0.8 09/03/2017 0923   EOSABS 0.2 12/03/2018 1002   BASOSABS 0.0 12/03/2018 1002   Iron/TIBC/Ferritin/ %Sat No results found for: IRON, TIBC, FERRITIN, IRONPCTSAT Lipid Panel     Component Value Date/Time   CHOL 170 12/03/2018 1002   TRIG 242 (H) 12/03/2018 1002   HDL 34 (L) 12/03/2018 1002   LDLCALC 88 12/03/2018 1002   Hepatic Function Panel     Component Value Date/Time   PROT 6.9 12/03/2018 1002   ALBUMIN 4.7 12/03/2018 1002   AST 20 12/03/2018 1002   ALT 26 12/03/2018 1002   ALKPHOS 127 (H) 12/03/2018 1002   BILITOT 0.3 12/03/2018 1002      Component Value Date/Time   TSH 2.150 12/03/2018 1002   TSH 2.45 09/03/2017 0923     Ref. Range 12/03/2018 10:02  Vitamin D, 25-Hydroxy Latest Ref Range: 30.0 - 100.0 ng/mL 25.8 (L)    I, [place] CraneJoanne Murray, am acting as transcriptionist for Quillian Quincearen , MD I have reviewed the above documentation for accuracy and completeness, and I agree with the above. -Quillian Quincearen , MD

## 2019-03-21 ENCOUNTER — Other Ambulatory Visit (INDEPENDENT_AMBULATORY_CARE_PROVIDER_SITE_OTHER): Payer: Self-pay | Admitting: Family Medicine

## 2019-03-21 DIAGNOSIS — Z6836 Body mass index (BMI) 36.0-36.9, adult: Principal | ICD-10-CM

## 2019-04-01 ENCOUNTER — Encounter (INDEPENDENT_AMBULATORY_CARE_PROVIDER_SITE_OTHER): Payer: Self-pay | Admitting: Family Medicine

## 2019-04-01 ENCOUNTER — Ambulatory Visit (INDEPENDENT_AMBULATORY_CARE_PROVIDER_SITE_OTHER): Payer: 59 | Admitting: Family Medicine

## 2019-04-01 ENCOUNTER — Other Ambulatory Visit: Payer: Self-pay

## 2019-04-01 DIAGNOSIS — F3289 Other specified depressive episodes: Secondary | ICD-10-CM

## 2019-04-01 DIAGNOSIS — Z6835 Body mass index (BMI) 35.0-35.9, adult: Secondary | ICD-10-CM

## 2019-04-01 MED ORDER — BUPROPION HCL ER (SR) 200 MG PO TB12: 200.0000 mg | ORAL_TABLET | Freq: Every day | ORAL | 0 refills | Status: DC

## 2019-04-01 NOTE — Progress Notes (Signed)
Office: [phone]  /  Fax: [phone] TeleHealth Visit:  Miranda Hunter has verbally consented to this TeleHealth visit today. The patient is located at home, the provider is located at the [facility] office. The participants in this visit include the listed provider and patient. The visit was conducted today via Face Time.  HPI:   Chief Complaint: OBESITY Miranda MastSamantha is here to discuss her progress with her obesity treatment plan. She is on the Category 3 plan and is following her eating plan approximately 80 to 85 % of the time. She states she is walking 60 minutes 7 times per week. Miranda MastSamantha has done well maintaining her weight even on vacation. She is working from home and is still struggling with stress and boredom eating. She lives alone so she is feeling isolated and snacking has comforted her.  We were unable to weigh the patient today for this TeleHealth visit. She feels as if she has maintained weight since her last visit. She has lost 18 lbs since starting treatment with us.  Depression with emotional eating behaviors Miranda MastSamantha notes feeling more stressed and distracted in the last month, which has worsened since prolonged COVID19 isolation and working from home. She notes feeling overwhelmed at times. She is struggling with emotional eating and using food for comfort to the extent that it is negatively impacting her health. She often snacks when she is not hungry. Miranda MastSamantha sometimes feels she is out of control and then feels guilty that she made poor food choices. She has been working on behavior modification techniques to help reduce her emotional eating and has been somewhat successful.   [facility] 09/03/2017  Decreased Interest 2 0  Down, Depressed, Hopeless 3 0  PHQ - 2 Score 5 0  Altered sleeping 1 -  Tired, decreased energy 3 -  Change in appetite 2 -  Feeling bad or failure about yourself  1 -  Trouble concentrating 1 -  Moving  slowly or fidgety/restless 0 -  Suicidal thoughts 0 -  PHQ-9 Score 13 -  Difficult doing work/chores Somewhat difficult -   ASSESSMENT AND PLAN:  Other depression - Plan: buPROPion (WELLBUTRIN SR) 200 MG 12 hr tablet  Class 2 severe obesity with serious comorbidity and body mass index (BMI) of 35.0 to 35.9 in adult, unspecified obesity type (HCC)  PLAN:  Depression with Emotional Eating Behaviors We discussed behavior modification techniques today to help Miranda MastSamantha deal with her emotional eating and depression. She has agreed to increase Wellbutrin SR to 200 mg qAM #30 with no refills and agreed to follow up as directed in 2 weeks.  Obesity Miranda MastSamantha is currently in the action stage of change. As such, her goal is to continue with weight loss efforts. She has agreed to follow the Category 3 plan. Miranda MastSamantha has been instructed to work up to a goal of 150 minutes of combined cardio and strengthening exercise per week for weight loss and overall health benefits. We discussed the following Behavioral Modification Strategies today: emotional eating strategies, ways to avoid boredom eating, better snacking choices, and ways to avoid night time snacking.  Miranda MastSamantha has agreed to follow up with our clinic in 2 weeks. She was informed of the importance of frequent follow up visits to maximize her success with intensive lifestyle modifications for her multiple health conditions.  ALLERGIES: Allergies  Allergen Reactions  . Amoxicillin-Pot Clavulanate Diarrhea    MEDICATIONS: Current Outpatient Medications on File Prior to Visit  Medication  Sig Dispense Refill  . albuterol (PROVENTIL HFA;VENTOLIN HFA) 108 (90 Base) MCG/ACT inhaler Inhale 2 puffs into the lungs every 6 (six) hours as needed for wheezing or shortness of breath. 1 Inhaler 2  . cetirizine (ZYRTEC) 10 MG tablet Take 10 mg by mouth daily.    . Cholecalciferol (VITAMIN D) 50 MCG (2000 UT) tablet Take 1 tablet (2,000 Units total) by  mouth daily. 30 tablet 0  . Erenumab-aooe (AIMOVIG) 140 MG/ML SOAJ Inject 140 mg into the skin every 30 (thirty) days. 1 pen 11  . Flaxseed, Linseed, (FLAX SEEDS PO) Take 1 tablet by mouth at bedtime.    . Insulin Pen Needle (BD PEN NEEDLE NANO 2ND GEN) 32G X 4 MM MISC 1 Package by Does not apply route 2 (two) times daily. 100 each 0  . ketorolac (TORADOL) 10 MG tablet Take 1 tablet (10 mg total) by mouth every 6 (six) hours as needed. 20 tablet 11  . Liraglutide -Weight Management (SAXENDA) 18 MG/3ML SOPN Inject 3 mg into the skin daily. 5 pen 0  . metFORMIN (GLUCOPHAGE) 500 MG tablet Take 1 tablet (500 mg total) by mouth daily with breakfast. 30 tablet 0  . Multiple Vitamin (MULTIVITAMIN) tablet Take 1 tablet by mouth daily.    . norethindrone (MICRONOR,CAMILA,ERRIN) 0.35 MG tablet Take 1 tablet by mouth daily.    . ondansetron (ZOFRAN-ODT) 4 MG disintegrating tablet Take 1 tablet (4 mg total) by mouth every 8 (eight) hours as needed for nausea. 30 tablet 3  . rizatriptan (MAXALT-MLT) 10 MG disintegrating tablet Take 1 tablet (10 mg total) by mouth as needed for migraine. May repeat in 2 hours if needed 9 tablet 11   No current facility-administered medications on file prior to visit.     PAST MEDICAL HISTORY: Past Medical History:  Diagnosis Date  . Anxiety   . Asthma   . History of chicken pox   . IBS (irritable bowel syndrome)   . Migraines     PAST SURGICAL HISTORY: Past Surgical History:  Procedure Laterality Date  . tonsillectomy and adnoidectomy Bilateral 2007    SOCIAL HISTORY: Social History   Tobacco Use  . Smoking status: Never Smoker  . Smokeless tobacco: Never Used  Substance Use Topics  . Alcohol use: Yes    Comment: socially  . Drug use: No    FAMILY HISTORY: Family History  Problem Relation Age of Onset  . Prostate cancer Father   . Hypertension Father   . Asthma Sister   . Lung cancer Maternal Grandmother        heavy smoker  . Lung cancer  Maternal Grandfather        heavy smoker  . Asthma Sister     ROS: Review of Systems  Psychiatric/Behavioral: Positive for depression.    PHYSICAL EXAM: Pt in no acute distress  RECENT LABS AND TESTS: BMET    Component Value Date/Time   NA 140 12/03/2018 1002   K 4.8 12/03/2018 1002   CL 102 12/03/2018 1002   CO2 22 12/03/2018 1002   GLUCOSE 81 12/03/2018 1002   GLUCOSE 84 09/03/2017 0923   BUN 5 (L) 12/03/2018 1002   CREATININE 0.67 12/03/2018 1002   CALCIUM 10.1 12/03/2018 1002   GFRNONAA 118 12/03/2018 1002   GFRAA 136 12/03/2018 1002   Lab Results  Component Value Date   HGBA1C 5.5 12/03/2018   Lab Results  Component Value Date   INSULIN 42.1 (H) 12/03/2018   CBC  Component Value Date/Time   WBC 9.8 12/03/2018 1002   WBC 11.7 (H) 09/03/2017 0923   RBC 4.78 12/03/2018 1002   RBC 4.71 09/03/2017 0923   HGB 13.8 12/03/2018 1002   HCT 42.9 12/03/2018 1002   PLT 445.0 (H) 09/03/2017 0923   MCV 90 12/03/2018 1002   MCH 28.9 12/03/2018 1002   MCHC 32.2 12/03/2018 1002   MCHC 32.2 09/03/2017 0923   RDW 13.1 12/03/2018 1002   LYMPHSABS 3.2 (H) 12/03/2018 1002   MONOABS 0.8 09/03/2017 0923   EOSABS 0.2 12/03/2018 1002   BASOSABS 0.0 12/03/2018 1002   Iron/TIBC/Ferritin/ %Sat No results found for: IRON, TIBC, FERRITIN, IRONPCTSAT Lipid Panel     Component Value Date/Time   CHOL 170 12/03/2018 1002   TRIG 242 (H) 12/03/2018 1002   HDL 34 (L) 12/03/2018 1002   LDLCALC 88 12/03/2018 1002   Hepatic Function Panel     Component Value Date/Time   PROT 6.9 12/03/2018 1002   ALBUMIN 4.7 12/03/2018 1002   AST 20 12/03/2018 1002   ALT 26 12/03/2018 1002   ALKPHOS 127 (H) 12/03/2018 1002   BILITOT 0.3 12/03/2018 1002      Component Value Date/Time   TSH 2.150 12/03/2018 1002   TSH 2.45 09/03/2017 0923   Results for MYLIYAH, REBUCK (MRN [id number]) as of 04/01/2019 15:59  Ref. Range 12/03/2018 10:02  Vitamin D, 25-Hydroxy Latest Ref Range: 30.0 - 100.0  ng/mL 25.8 (L)     I, Kirke Corin, CMA, am acting as transcriptionist for Wilder Glade, MD I have reviewed the above documentation for accuracy and completeness, and I agree with the above. -Quillian Quince, MD

## 2019-04-15 ENCOUNTER — Other Ambulatory Visit (INDEPENDENT_AMBULATORY_CARE_PROVIDER_SITE_OTHER): Payer: Self-pay | Admitting: Family Medicine

## 2019-04-15 ENCOUNTER — Other Ambulatory Visit: Payer: Self-pay

## 2019-04-15 ENCOUNTER — Encounter (INDEPENDENT_AMBULATORY_CARE_PROVIDER_SITE_OTHER): Payer: Self-pay | Admitting: Family Medicine

## 2019-04-15 ENCOUNTER — Ambulatory Visit (INDEPENDENT_AMBULATORY_CARE_PROVIDER_SITE_OTHER): Payer: 59 | Admitting: Family Medicine

## 2019-04-15 DIAGNOSIS — E8881 Metabolic syndrome: Secondary | ICD-10-CM | POA: Diagnosis not present

## 2019-04-15 DIAGNOSIS — F3289 Other specified depressive episodes: Secondary | ICD-10-CM

## 2019-04-15 DIAGNOSIS — Z6835 Body mass index (BMI) 35.0-35.9, adult: Secondary | ICD-10-CM | POA: Diagnosis not present

## 2019-04-15 MED ORDER — METFORMIN HCL 500 MG PO TABS: 500.0000 mg | ORAL_TABLET | Freq: Every day | ORAL | 0 refills | Status: DC

## 2019-04-16 NOTE — Progress Notes (Signed)
Office: [phone]  /  Fax: [phone] TeleHealth Visit:  Miranda Hunter has verbally consented to this TeleHealth visit today. The patient is located at home, the provider is located at the [facility] office. The participants in this visit include the listed provider and patient. The visit was conducted today via Face Time.  HPI:   Chief Complaint: OBESITY Miranda Hunter is here to discuss her progress with her obesity treatment plan. She is on the Category 3 plan and is following her eating plan approximately 80 % of the time. She states she is walking 45 to 60 minutes 7 times per week. Miranda Hunter notes increased stress working from home. She is maintaining her weight. Miranda Hunter has been on Saxenda and did not feel that it helped at a low dose, but she had GI upset when she tried to increase the dose. She stopped last week and feels better now.  We were unable to weigh the patient today for this TeleHealth visit. She feels as if she has maintained weight since her last visit. She has lost 18 lbs since starting treatment with us.  Insulin Resistance Miranda Hunter has a diagnosis of insulin resistance based on her elevated fasting insulin level >5. Although Miranda Hunter's blood glucose readings are still under good control, insulin resistance puts her at greater risk of metabolic syndrome and diabetes. Her last A1c was 5.5 on 12/03/18 and she feels that her polyphagia has decreased. She is stable on metformin currently and is working on diet and exercise to decrease risk of diabetes.  Depression with emotional eating behaviors Miranda Hunter is on Wellbutrin, but still struggling with afternoon snacking which is unrelated to hunger. This has worsened since COVID19 isolation. She is struggling with emotional eating and using food for comfort to the extent that it is negatively impacting her health. She often snacks when she is not hungry. Miranda Hunter sometimes feels she is out of control and then feels  guilty that she made poor food choices. She has been working on behavior modification techniques to help reduce her emotional eating and has been somewhat successful. She shows no sign of suicidal or homicidal ideations.  [facility] 12/03/2018 09/03/2017  Decreased Interest 2 0  Down, Depressed, Hopeless 3 0  PHQ - 2 Score 5 0  Altered sleeping 1 -  Tired, decreased energy 3 -  Change in appetite 2 -  Feeling bad or failure about yourself  1 -  Trouble concentrating 1 -  Moving slowly or fidgety/restless 0 -  Suicidal thoughts 0 -  PHQ-9 Score 13 -  Difficult doing work/chores Somewhat difficult -   ASSESSMENT AND PLAN:  Insulin resistance - Plan: metFORMIN (GLUCOPHAGE) 500 MG tablet  Other depression - with emotional eating  Class 2 severe obesity with serious comorbidity and body mass index (BMI) of 35.0 to 35.9 in adult, unspecified obesity type (HCC)  PLAN:  Insulin Resistance Miranda Hunter will continue to work on weight loss, exercise, and decreasing simple carbohydrates in her diet to help decrease the risk of diabetes. She was informed that eating too many simple carbohydrates or too many calories at one sitting increases the likelihood of GI side effects. Miranda Hunter agreed to continue metformin 500 mg qAM #30 with no refills and prescription was written today. Miranda Hunter agreed to follow up with us as directed to monitor her progress in 2 weeks.  Depression with Emotional Eating Behaviors We discussed cognitive behavior modification techniques today to help Miranda Hunter deal with decreasing emotional eating and depression.  She is okay to move her Wellbutrin to noon to get better afternoon coverage. Miranda Hunter agreed to follow up as directed.  Obesity Miranda Hunter is currently in the action stage of change. As such, her goal is to continue with weight loss efforts. She has agreed to follow the Category 3 plan. Miranda Hunter has been instructed to work up to a goal of 150 minutes of  combined cardio and strengthening exercise per week for weight loss and overall health benefits. We discussed the following Behavioral Modification Strategies today: emotional eating strategies.  Miranda Hunter has agreed to follow up with our clinic in 2 weeks. She was informed of the importance of frequent follow up visits to maximize her success with intensive lifestyle modifications for her multiple health conditions.  ALLERGIES: Allergies  Allergen Reactions  . Amoxicillin-Pot Clavulanate Diarrhea    MEDICATIONS: Current Outpatient Medications on File Prior to Visit  Medication Sig Dispense Refill  . albuterol (PROVENTIL HFA;VENTOLIN HFA) 108 (90 Base) MCG/ACT inhaler Inhale 2 puffs into the lungs every 6 (six) hours as needed for wheezing or shortness of breath. 1 Inhaler 2  . buPROPion (WELLBUTRIN SR) 200 MG 12 hr tablet Take 1 tablet (200 mg total) by mouth daily. 30 tablet 0  . cetirizine (ZYRTEC) 10 MG tablet Take 10 mg by mouth daily.    . Cholecalciferol (VITAMIN D) 50 MCG (2000 UT) tablet Take 1 tablet (2,000 Units total) by mouth daily. 30 tablet 0  . Erenumab-aooe (AIMOVIG) 140 MG/ML SOAJ Inject 140 mg into the skin every 30 (thirty) days. 1 pen 11  . Flaxseed, Linseed, (FLAX SEEDS PO) Take 1 tablet by mouth at bedtime.    . Insulin Pen Needle (BD PEN NEEDLE NANO 2ND GEN) 32G X 4 MM MISC 1 Package by Does not apply route 2 (two) times daily. 100 each 0  . ketorolac (TORADOL) 10 MG tablet Take 1 tablet (10 mg total) by mouth every 6 (six) hours as needed. 20 tablet 11  . Liraglutide -Weight Management (SAXENDA) 18 MG/3ML SOPN Inject 3 mg into the skin daily. 5 pen 0  . Multiple Vitamin (MULTIVITAMIN) tablet Take 1 tablet by mouth daily.    . norethindrone (MICRONOR,CAMILA,ERRIN) 0.35 MG tablet Take 1 tablet by mouth daily.    . ondansetron (ZOFRAN-ODT) 4 MG disintegrating tablet Take 1 tablet (4 mg total) by mouth every 8 (eight) hours as needed for nausea. 30 tablet 3  .  rizatriptan (MAXALT-MLT) 10 MG disintegrating tablet Take 1 tablet (10 mg total) by mouth as needed for migraine. May repeat in 2 hours if needed 9 tablet 11   No current facility-administered medications on file prior to visit.     PAST MEDICAL HISTORY: Past Medical History:  Diagnosis Date  . Anxiety   . Asthma   . History of chicken pox   . IBS (irritable bowel syndrome)   . Migraines     PAST SURGICAL HISTORY: Past Surgical History:  Procedure Laterality Date  . tonsillectomy and adnoidectomy Bilateral 2007    SOCIAL HISTORY: Social History   Tobacco Use  . Smoking status: Never Smoker  . Smokeless tobacco: Never Used  Substance Use Topics  . Alcohol use: Yes    Comment: socially  . Drug use: No    FAMILY HISTORY: Family History  Problem Relation Age of Onset  . Prostate cancer Father   . Hypertension Father   . Asthma Sister   . Lung cancer Maternal Grandmother        heavy smoker  .  Lung cancer Maternal Grandfather        heavy smoker  . Asthma Sister     ROS: Review of Systems  Endo/Heme/Allergies:       Positive for polyphagia.  Psychiatric/Behavioral: Positive for depression. Negative for suicidal ideas.       Negative for homicidal ideations.    PHYSICAL EXAM: Pt in no acute distress  RECENT LABS AND TESTS: BMET    Component Value Date/Time   NA 140 12/03/2018 1002   K 4.8 12/03/2018 1002   CL 102 12/03/2018 1002   CO2 22 12/03/2018 1002   GLUCOSE 81 12/03/2018 1002   GLUCOSE 84 09/03/2017 0923   BUN 5 (L) 12/03/2018 1002   CREATININE 0.67 12/03/2018 1002   CALCIUM 10.1 12/03/2018 1002   GFRNONAA 118 12/03/2018 1002   GFRAA 136 12/03/2018 1002   Lab Results  Component Value Date   HGBA1C 5.5 12/03/2018   Lab Results  Component Value Date   INSULIN 42.1 (H) 12/03/2018   CBC    Component Value Date/Time   WBC 9.8 12/03/2018 1002   WBC 11.7 (H) 09/03/2017 0923   RBC 4.78 12/03/2018 1002   RBC 4.71 09/03/2017 0923   HGB  13.8 12/03/2018 1002   HCT 42.9 12/03/2018 1002   PLT 445.0 (H) 09/03/2017 0923   MCV 90 12/03/2018 1002   MCH 28.9 12/03/2018 1002   MCHC 32.2 12/03/2018 1002   MCHC 32.2 09/03/2017 0923   RDW 13.1 12/03/2018 1002   LYMPHSABS 3.2 (H) 12/03/2018 1002   MONOABS 0.8 09/03/2017 0923   EOSABS 0.2 12/03/2018 1002   BASOSABS 0.0 12/03/2018 1002   Iron/TIBC/Ferritin/ %Sat No results found for: IRON, TIBC, FERRITIN, IRONPCTSAT Lipid Panel     Component Value Date/Time   CHOL 170 12/03/2018 1002   TRIG 242 (H) 12/03/2018 1002   HDL 34 (L) 12/03/2018 1002   LDLCALC 88 12/03/2018 1002   Hepatic Function Panel     Component Value Date/Time   PROT 6.9 12/03/2018 1002   ALBUMIN 4.7 12/03/2018 1002   AST 20 12/03/2018 1002   ALT 26 12/03/2018 1002   ALKPHOS 127 (H) 12/03/2018 1002   BILITOT 0.3 12/03/2018 1002      Component Value Date/Time   TSH 2.150 12/03/2018 1002   TSH 2.45 09/03/2017 0923   Results for AASIA, PEAVLER (MRN [id number]) as of 04/16/2019 12:40  Ref. Range 12/03/2018 10:02  Vitamin D, 25-Hydroxy Latest Ref Range: 30.0 - 100.0 ng/mL 25.8 (L)     I, Kirke Corin, CMA, am acting as transcriptionist for Wilder Glade, MD I have reviewed the above documentation for accuracy and completeness, and I agree with the above. -Quillian Quince, MD  '

## 2019-04-27 ENCOUNTER — Other Ambulatory Visit (INDEPENDENT_AMBULATORY_CARE_PROVIDER_SITE_OTHER): Payer: Self-pay | Admitting: Family Medicine

## 2019-04-27 DIAGNOSIS — F3289 Other specified depressive episodes: Secondary | ICD-10-CM

## 2019-04-28 ENCOUNTER — Other Ambulatory Visit: Payer: Self-pay

## 2019-04-28 ENCOUNTER — Encounter (INDEPENDENT_AMBULATORY_CARE_PROVIDER_SITE_OTHER): Payer: Self-pay | Admitting: Family Medicine

## 2019-04-28 ENCOUNTER — Ambulatory Visit (INDEPENDENT_AMBULATORY_CARE_PROVIDER_SITE_OTHER): Payer: 59 | Admitting: Family Medicine

## 2019-04-28 DIAGNOSIS — F32A Depression, unspecified: Secondary | ICD-10-CM | POA: Insufficient documentation

## 2019-04-28 DIAGNOSIS — Z6835 Body mass index (BMI) 35.0-35.9, adult: Secondary | ICD-10-CM

## 2019-04-28 DIAGNOSIS — F3289 Other specified depressive episodes: Secondary | ICD-10-CM

## 2019-04-28 DIAGNOSIS — F329 Major depressive disorder, single episode, unspecified: Secondary | ICD-10-CM | POA: Insufficient documentation

## 2019-04-28 DIAGNOSIS — E6609 Other obesity due to excess calories: Secondary | ICD-10-CM | POA: Insufficient documentation

## 2019-04-28 MED ORDER — BUPROPION HCL ER (SR) 200 MG PO TB12: 200.0000 mg | ORAL_TABLET | Freq: Every day | ORAL | 0 refills | Status: DC

## 2019-04-28 NOTE — Progress Notes (Signed)
Office: [phone]  /  Fax: [phone] TeleHealth Visit:  Miranda Hunter has verbally consented to this TeleHealth visit today. The patient is located at home, the provider is located at the [facility] office. The participants in this visit include the listed provider and patient. The visit was conducted today via [email or web address].  HPI:   Chief Complaint: OBESITY Miranda Hunter is here to discuss her progress with her obesity treatment plan. She is on the Category 3 plan and is following her eating plan approximately 70 % of the time. She states she is walking the dog 60 minutes 7 times per week. Miranda Hunter has been working from home and is struggling to find a routine. She thinks that she has gained 2 pounds in the last month. Miranda Hunter is stable on Saxenda and denies nausea, vomiting, or hypoglycemia.  We were unable to weigh the patient today for this TeleHealth visit. She feels as if she has gained weight since her last visit. She has lost 18 lbs since starting treatment with us.  Depression with emotional eating behaviors Miranda Hunter is stable on Wellbutrin, but she is struggling with meal planning and prepping, which encourages mores stress and boredom eating. She is struggling with emotional eating and using food for comfort to the extent that it is negatively impacting her health. She often snacks when she is not hungry. Miranda Hunter sometimes feels she is out of control and then feels guilty that she made poor food choices. She has been working on behavior modification techniques to help reduce her emotional eating and has been somewhat successful.   [facility] 12/03/2018 09/03/2017  Decreased Interest 2 0  Down, Depressed, Hopeless 3 0  PHQ - 2 Score 5 0  Altered sleeping 1 -  Tired, decreased energy 3 -  Change in appetite 2 -  Feeling bad or failure about yourself  1 -  Trouble concentrating 1 -  Moving slowly or fidgety/restless 0 -  Suicidal thoughts 0 -  PHQ-9  Score 13 -  Difficult doing work/chores Somewhat difficult -   ASSESSMENT AND PLAN:  Other depression - with emotional eating - Plan: buPROPion (WELLBUTRIN SR) 200 MG 12 hr tablet  Class 2 severe obesity with serious comorbidity and body mass index (BMI) of 35.0 to 35.9 in adult, unspecified obesity type (HCC)  PLAN:  Depression with Emotional Eating Behaviors We discussed emotional eating strategies today to help Miranda Hunter deal with her emotional eating and depression. She has agreed to take Wellbutrin SR 200 mg qd #30 with no refills and agreed to follow up as directed in 3 weeks.  I spent > than 50% of the 25 minute visit on counseling as documented in the note.  Obesity Miranda Hunter is currently in the action stage of change. As such, her goal is to continue with weight loss efforts. She has agreed to keep a food journal with 1400 to 1700 calories and 90+ grams of protein.  Miranda Hunter has been instructed to work up to a goal of 150 minutes of combined cardio and strengthening exercise per week for weight loss and overall health benefits. We discussed the following Behavioral Modification Strategies today: increasing lean protein intake, decreasing simple carbohydrates, work on meal planning and easy cooking plans, emotional eating strategies, and ways to avoid boredom eating. We discussed various medication options to help [facility] with her weight loss efforts and we both agreed to continue Saxenda as prescribed.  Miranda Hunter has agreed to follow up with our clinic in 3  weeks. She was informed of the importance of frequent follow up visits to maximize her success with intensive lifestyle modifications for her multiple health conditions.  ALLERGIES: Allergies  Allergen Reactions  . Amoxicillin-Pot Clavulanate Diarrhea    MEDICATIONS: Current Outpatient Medications on File Prior to Visit  Medication Sig Dispense Refill  . albuterol (PROVENTIL HFA;VENTOLIN HFA) 108 (90 Base) MCG/ACT inhaler  Inhale 2 puffs into the lungs every 6 (six) hours as needed for wheezing or shortness of breath. 1 Inhaler 2  . cetirizine (ZYRTEC) 10 MG tablet Take 10 mg by mouth daily.    . Cholecalciferol (VITAMIN D) 50 MCG (2000 UT) tablet Take 1 tablet (2,000 Units total) by mouth daily. 30 tablet 0  . Erenumab-aooe (AIMOVIG) 140 MG/ML SOAJ Inject 140 mg into the skin every 30 (thirty) days. 1 pen 11  . Flaxseed, Linseed, (FLAX SEEDS PO) Take 1 tablet by mouth at bedtime.    . Insulin Pen Needle (BD PEN NEEDLE NANO 2ND GEN) 32G X 4 MM MISC 1 Package by Does not apply route 2 (two) times daily. 100 each 0  . ketorolac (TORADOL) 10 MG tablet Take 1 tablet (10 mg total) by mouth every 6 (six) hours as needed. 20 tablet 11  . Liraglutide -Weight Management (SAXENDA) 18 MG/3ML SOPN Inject 3 mg into the skin daily. 5 pen 0  . metFORMIN (GLUCOPHAGE) 500 MG tablet Take 1 tablet (500 mg total) by mouth daily with breakfast. 30 tablet 0  . Multiple Vitamin (MULTIVITAMIN) tablet Take 1 tablet by mouth daily.    . norethindrone (MICRONOR,CAMILA,ERRIN) 0.35 MG tablet Take 1 tablet by mouth daily.    . ondansetron (ZOFRAN-ODT) 4 MG disintegrating tablet Take 1 tablet (4 mg total) by mouth every 8 (eight) hours as needed for nausea. 30 tablet 3  . rizatriptan (MAXALT-MLT) 10 MG disintegrating tablet Take 1 tablet (10 mg total) by mouth as needed for migraine. May repeat in 2 hours if needed 9 tablet 11   No current facility-administered medications on file prior to visit.     PAST MEDICAL HISTORY: Past Medical History:  Diagnosis Date  . Anxiety   . Asthma   . History of chicken pox   . IBS (irritable bowel syndrome)   . Migraines     PAST SURGICAL HISTORY: Past Surgical History:  Procedure Laterality Date  . tonsillectomy and adnoidectomy Bilateral 2007    SOCIAL HISTORY: Social History   Tobacco Use  . Smoking status: Never Smoker  . Smokeless tobacco: Never Used  Substance Use Topics  . Alcohol  use: Yes    Comment: socially  . Drug use: No    FAMILY HISTORY: Family History  Problem Relation Age of Onset  . Prostate cancer Father   . Hypertension Father   . Asthma Sister   . Lung cancer Maternal Grandmother        heavy smoker  . Lung cancer Maternal Grandfather        heavy smoker  . Asthma Sister     ROS: Review of Systems  Gastrointestinal: Negative for nausea and vomiting.  Endo/Heme/Allergies:       Negative for hypoglycemia.  Psychiatric/Behavioral: Positive for depression.    PHYSICAL EXAM: Pt in no acute distress  RECENT LABS AND TESTS: BMET    Component Value Date/Time   NA 140 12/03/2018 1002   K 4.8 12/03/2018 1002   CL 102 12/03/2018 1002   CO2 22 12/03/2018 1002   GLUCOSE 81 12/03/2018 1002  GLUCOSE 84 09/03/2017 0923   BUN 5 (L) 12/03/2018 1002   CREATININE 0.67 12/03/2018 1002   CALCIUM 10.1 12/03/2018 1002   GFRNONAA 118 12/03/2018 1002   GFRAA 136 12/03/2018 1002   Lab Results  Component Value Date   HGBA1C 5.5 12/03/2018   Lab Results  Component Value Date   INSULIN 42.1 (H) 12/03/2018   CBC    Component Value Date/Time   WBC 9.8 12/03/2018 1002   WBC 11.7 (H) 09/03/2017 0923   RBC 4.78 12/03/2018 1002   RBC 4.71 09/03/2017 0923   HGB 13.8 12/03/2018 1002   HCT 42.9 12/03/2018 1002   PLT 445.0 (H) 09/03/2017 0923   MCV 90 12/03/2018 1002   MCH 28.9 12/03/2018 1002   MCHC 32.2 12/03/2018 1002   MCHC 32.2 09/03/2017 0923   RDW 13.1 12/03/2018 1002   LYMPHSABS 3.2 (H) 12/03/2018 1002   MONOABS 0.8 09/03/2017 0923   EOSABS 0.2 12/03/2018 1002   BASOSABS 0.0 12/03/2018 1002   Iron/TIBC/Ferritin/ %Sat No results found for: IRON, TIBC, FERRITIN, IRONPCTSAT Lipid Panel     Component Value Date/Time   CHOL 170 12/03/2018 1002   TRIG 242 (H) 12/03/2018 1002   HDL 34 (L) 12/03/2018 1002   LDLCALC 88 12/03/2018 1002   Hepatic Function Panel     Component Value Date/Time   PROT 6.9 12/03/2018 1002   ALBUMIN 4.7  12/03/2018 1002   AST 20 12/03/2018 1002   ALT 26 12/03/2018 1002   ALKPHOS 127 (H) 12/03/2018 1002   BILITOT 0.3 12/03/2018 1002      Component Value Date/Time   TSH 2.150 12/03/2018 1002   TSH 2.45 09/03/2017 0923   Results for ANGLIQUE, VIVENZIO (MRN [id number]) as of 04/28/2019 10:57  Ref. Range 12/03/2018 10:02  Vitamin D, 25-Hydroxy Latest Ref Range: 30.0 - 100.0 ng/mL 25.8 (L)     I, Kirke Corin, CMA, am acting as transcriptionist for Wilder Glade, MD I have reviewed the above documentation for accuracy and completeness, and I agree with the above. -Quillian Quince, MD

## 2019-04-30 ENCOUNTER — Ambulatory Visit (INDEPENDENT_AMBULATORY_CARE_PROVIDER_SITE_OTHER): Payer: 59 | Admitting: Family Medicine

## 2019-05-18 ENCOUNTER — Ambulatory Visit (INDEPENDENT_AMBULATORY_CARE_PROVIDER_SITE_OTHER): Payer: 59 | Admitting: Family Medicine

## 2019-05-18 ENCOUNTER — Other Ambulatory Visit: Payer: Self-pay

## 2019-05-18 VITALS — BP 115/77 | HR 77 | Temp 97.8°F | Ht 69.0 in | Wt 239.0 lb

## 2019-05-18 DIAGNOSIS — R7303 Prediabetes: Secondary | ICD-10-CM | POA: Diagnosis not present

## 2019-05-18 DIAGNOSIS — Z6835 Body mass index (BMI) 35.0-35.9, adult: Secondary | ICD-10-CM

## 2019-05-18 NOTE — Progress Notes (Signed)
Office: [phone]  /  Fax: [phone]   HPI:   Chief Complaint: OBESITY Miranda Hunter is here to discuss her progress with her obesity treatment plan. She is on the keep a food journal with 1400 to 1700 calories and 90+ grams of protein daily plan and is following her eating plan approximately 90 % of the time. She states she is walking 30 minutes 2 to 3 days per week. Miranda Hunter has done well with not gaining weight in the last 3 months with the COVID-19 isolation. She has been journaling most of the time. Miranda Hunter has been drinking protein smoothies to meet her protein goals. Her weight is 239 lb (108.4 kg) today and has had a weight loss of 2 pounds since her last in-office visit. She has lost 18 lbs since starting treatment with us.  Pre-Diabetes Miranda Hunter has a diagnosis of prediabetes based on her elevated Hgb A1c and was informed this puts her at greater risk of developing diabetes. Miranda Hunter is doing well on diet. She is working on decreasing simple carbohydrates in her diet. Miranda Hunter is still on metformin and she continues to work on diet and exercise to decrease risk of diabetes. She denies nausea, vomiting or hypoglycemia.  ASSESSMENT AND PLAN:  Prediabetes  Class 2 severe obesity with serious comorbidity and body mass index (BMI) of 35.0 to 35.9 in adult, unspecified obesity type [facility](HCC)  PLAN:  Pre-Diabetes Miranda Hunter will continue to work on weight loss, exercise, and decreasing simple carbohydrates in her diet to help decrease the risk of diabetes. We dicussed metformin including benefits and risks. She was informed that eating too many simple carbohydrates or too many calories at one sitting increases the likelihood of GI side effects. Miranda Hunter will continue metformin and diet and she will follow up with us as directed to monitor her progress.  I spent > than 50% of the 15 minute visit on counseling as documented in the note.  Obesity Miranda Hunter is currently in the action stage  of change. As such, her goal is to continue with weight loss efforts She has agreed to keep a food journal with 1700 calories and 90+ grams of protein daily Miranda Hunter has been instructed to work up to a goal of 150 minutes of combined cardio and strengthening exercise per week for weight loss and overall health benefits. We discussed the following Behavioral Modification Strategies today: increasing lean protein intake, decreasing simple carbohydrates , work on meal planning and easy cooking plans and decrease liquid calories  Miranda Hunter has agreed to follow up with our clinic in 3 weeks fasting. She was informed of the importance of frequent follow up visits to maximize her success with intensive lifestyle modifications for her multiple health conditions.  ALLERGIES: Allergies  Allergen Reactions  . Amoxicillin-Pot Clavulanate Diarrhea    MEDICATIONS: Current Outpatient Medications on File Prior to Visit  Medication Sig Dispense Refill  . albuterol (PROVENTIL HFA;VENTOLIN HFA) 108 (90 Base) MCG/ACT inhaler Inhale 2 puffs into the lungs every 6 (six) hours as needed for wheezing or shortness of breath. 1 Inhaler 2  . buPROPion (WELLBUTRIN SR) 200 MG 12 hr tablet Take 1 tablet (200 mg total) by mouth daily. 30 tablet 0  . cetirizine (ZYRTEC) 10 MG tablet Take 10 mg by mouth daily.    . Cholecalciferol (VITAMIN D) 50 MCG (2000 UT) tablet Take 1 tablet (2,000 Units total) by mouth daily. 30 tablet 0  . Erenumab-aooe (AIMOVIG) 140 MG/ML SOAJ Inject 140 mg into the skin every 30 (thirty)  days. 1 pen 11  . Flaxseed, Linseed, (FLAX SEEDS PO) Take 1 tablet by mouth at bedtime.    . Insulin Pen Needle (BD PEN NEEDLE NANO 2ND GEN) 32G X 4 MM MISC 1 Package by Does not apply route 2 (two) times daily. 100 each 0  . ketorolac (TORADOL) 10 MG tablet Take 1 tablet (10 mg total) by mouth every 6 (six) hours as needed. 20 tablet 11  . Liraglutide -Weight Management (SAXENDA) 18 MG/3ML SOPN Inject 3 mg into the  skin daily. 5 pen 0  . metFORMIN (GLUCOPHAGE) 500 MG tablet Take 1 tablet (500 mg total) by mouth daily with breakfast. 30 tablet 0  . Multiple Vitamin (MULTIVITAMIN) tablet Take 1 tablet by mouth daily.    . norethindrone (MICRONOR,CAMILA,ERRIN) 0.35 MG tablet Take 1 tablet by mouth daily.    . ondansetron (ZOFRAN-ODT) 4 MG disintegrating tablet Take 1 tablet (4 mg total) by mouth every 8 (eight) hours as needed for nausea. 30 tablet 3  . rizatriptan (MAXALT-MLT) 10 MG disintegrating tablet Take 1 tablet (10 mg total) by mouth as needed for migraine. May repeat in 2 hours if needed 9 tablet 11   No current facility-administered medications on file prior to visit.     PAST MEDICAL HISTORY: Past Medical History:  Diagnosis Date  . Anxiety   . Asthma   . History of chicken pox   . IBS (irritable bowel syndrome)   . Migraines     PAST SURGICAL HISTORY: Past Surgical History:  Procedure Laterality Date  . tonsillectomy and adnoidectomy Bilateral 2007    SOCIAL HISTORY: Social History   Tobacco Use  . Smoking status: Never Smoker  . Smokeless tobacco: Never Used  Substance Use Topics  . Alcohol use: Yes    Comment: socially  . Drug use: No    FAMILY HISTORY: Family History  Problem Relation Age of Onset  . Prostate cancer Father   . Hypertension Father   . Asthma Sister   . Lung cancer Maternal Grandmother        heavy smoker  . Lung cancer Maternal Grandfather        heavy smoker  . Asthma Sister     ROS: Review of Systems  Constitutional: Positive for weight loss.  Gastrointestinal: Negative for nausea and vomiting.  Endo/Heme/Allergies:       Negative for hypoglycemia    PHYSICAL EXAM: Blood pressure 115/77, pulse 77, temperature 97.8 F (36.6 C), height 5\' 9"  (1.753 m), weight 239 lb (108.4 kg), SpO2 96 %. Body mass index is 35.29 kg/m. Physical Exam  RECENT LABS AND TESTS: BMET    Component Value Date/Time   NA 140 12/03/2018 1002   K 4.8  12/03/2018 1002   CL 102 12/03/2018 1002   CO2 22 12/03/2018 1002   GLUCOSE 81 12/03/2018 1002   GLUCOSE 84 09/03/2017 0923   BUN 5 (L) 12/03/2018 1002   CREATININE 0.67 12/03/2018 1002   CALCIUM 10.1 12/03/2018 1002   GFRNONAA 118 12/03/2018 1002   GFRAA 136 12/03/2018 1002   Lab Results  Component Value Date   HGBA1C 5.5 12/03/2018   Lab Results  Component Value Date   INSULIN 42.1 (H) 12/03/2018   CBC    Component Value Date/Time   WBC 9.8 12/03/2018 1002   WBC 11.7 (H) 09/03/2017 0923   RBC 4.78 12/03/2018 1002   RBC 4.71 09/03/2017 0923   HGB 13.8 12/03/2018 1002   HCT 42.9 12/03/2018 1002   PLT  445.0 (H) 09/03/2017 0923   MCV 90 12/03/2018 1002   MCH 28.9 12/03/2018 1002   MCHC 32.2 12/03/2018 1002   MCHC 32.2 09/03/2017 0923   RDW 13.1 12/03/2018 1002   LYMPHSABS 3.2 (H) 12/03/2018 1002   MONOABS 0.8 09/03/2017 0923   EOSABS 0.2 12/03/2018 1002   BASOSABS 0.0 12/03/2018 1002   Iron/TIBC/Ferritin/ %Sat No results found for: IRON, TIBC, FERRITIN, IRONPCTSAT Lipid Panel     Component Value Date/Time   CHOL 170 12/03/2018 1002   TRIG 242 (H) 12/03/2018 1002   HDL 34 (L) 12/03/2018 1002   LDLCALC 88 12/03/2018 1002   Hepatic Function Panel     Component Value Date/Time   PROT 6.9 12/03/2018 1002   ALBUMIN 4.7 12/03/2018 1002   AST 20 12/03/2018 1002   ALT 26 12/03/2018 1002   ALKPHOS 127 (H) 12/03/2018 1002   BILITOT 0.3 12/03/2018 1002      Component Value Date/Time   TSH 2.150 12/03/2018 1002   TSH 2.45 09/03/2017 0923     Ref. Range 12/03/2018 10:02  Vitamin D, 25-Hydroxy Latest Ref Range: 30.0 - 100.0 ng/mL 25.8 (L)    OBESITY BEHAVIORAL INTERVENTION VISIT  Today's visit was # 11   Starting weight: 257 lbs Starting date: 12/03/2018 Today's weight : 239 lbs Today's date: 05/18/2019 Total lbs lost to date: 18    05/18/2019  Height 5\' 9"  (1.753 m)  Weight 239 lb (108.4 kg)  BMI (Calculated) 35.28  BLOOD PRESSURE - SYSTOLIC 115  BLOOD  PRESSURE - DIASTOLIC 77   Body Fat % 40.8 %  Total Body Water (lbs) 88.81 lbs    ASK: We discussed the diagnosis of obesity with [name] Jowett today and Miranda Hunter agreed to give us permission to discuss obesity behavioral modification therapy today.  ASSESS: Miranda Hunter has the diagnosis of obesity and her BMI today is 35.28 Miranda Hunter is in the action stage of change   ADVISE: Miranda Hunter was educated on the multiple health risks of obesity as well as the benefit of weight loss to improve her health. She was advised of the need for long term treatment and the importance of lifestyle modifications to improve her current health and to decrease her risk of future health problems.  AGREE: Multiple dietary modification options and treatment options were discussed and  Arthelia agreed to follow the recommendations documented in the above note.  ARRANGE: Miranda Hunter was educated on the importance of frequent visits to treat obesity as outlined per CMS and USPSTF guidelines and agreed to schedule her next follow up appointment today.  I, [place] CraneJoanne Murray, am acting as transcriptionist for Quillian Quincearen Shyheim Tanney, MD  I have reviewed the above documentation for accuracy and completeness, and I agree with the above. -Quillian Quincearen Alyshia Kernan, MD

## 2019-05-21 ENCOUNTER — Other Ambulatory Visit (INDEPENDENT_AMBULATORY_CARE_PROVIDER_SITE_OTHER): Payer: Self-pay | Admitting: Family Medicine

## 2019-05-21 DIAGNOSIS — E8881 Metabolic syndrome: Secondary | ICD-10-CM

## 2019-06-04 ENCOUNTER — Other Ambulatory Visit (INDEPENDENT_AMBULATORY_CARE_PROVIDER_SITE_OTHER): Payer: Self-pay | Admitting: Family Medicine

## 2019-06-04 DIAGNOSIS — F3289 Other specified depressive episodes: Secondary | ICD-10-CM

## 2019-06-08 ENCOUNTER — Ambulatory Visit (INDEPENDENT_AMBULATORY_CARE_PROVIDER_SITE_OTHER): Payer: 59 | Admitting: Family Medicine

## 2019-06-08 ENCOUNTER — Other Ambulatory Visit: Payer: Self-pay

## 2019-06-08 ENCOUNTER — Encounter (INDEPENDENT_AMBULATORY_CARE_PROVIDER_SITE_OTHER): Payer: Self-pay | Admitting: Family Medicine

## 2019-06-08 VITALS — BP 106/72 | HR 106 | Temp 97.8°F | Ht 69.0 in | Wt 239.0 lb

## 2019-06-08 DIAGNOSIS — Z9189 Other specified personal risk factors, not elsewhere classified: Secondary | ICD-10-CM | POA: Diagnosis not present

## 2019-06-08 DIAGNOSIS — F3289 Other specified depressive episodes: Secondary | ICD-10-CM

## 2019-06-08 DIAGNOSIS — Z6835 Body mass index (BMI) 35.0-35.9, adult: Secondary | ICD-10-CM

## 2019-06-08 DIAGNOSIS — E8881 Metabolic syndrome: Secondary | ICD-10-CM | POA: Diagnosis not present

## 2019-06-08 MED ORDER — BUPROPION HCL ER (SR) 200 MG PO TB12: 200.0000 mg | ORAL_TABLET | Freq: Every day | ORAL | 0 refills | Status: DC

## 2019-06-08 MED ORDER — METFORMIN HCL 500 MG PO TABS: 500.0000 mg | ORAL_TABLET | Freq: Two times a day (BID) | ORAL | 0 refills | Status: DC

## 2019-06-08 NOTE — Progress Notes (Signed)
Office: [phone]  /  Fax: [phone]   HPI:   Chief Complaint: OBESITY Miranda Hunter is here to discuss her progress with her obesity treatment plan. She is keeping a food journal with 1700 calories and 90+ grams of protein and is following her eating plan approximately 85% of the time. She states she is walking 60 minutes 7 times per week. Miranda Hunter did well with journaling for 7-10 days and then started having a migraine headache for 6 days in a row and stopped journaling and had decreased protein intake due to nausea. She is feeling better now and ready to get back on track.  Her weight is 239 lb (108.4 kg) today and has not lost weight since her last visit. She has lost 18 lbs since starting treatment with us.  Insulin Resistance Miranda Hunter has a diagnosis of insulin resistance based on her elevated fasting insulin level >5. Although Loree's blood glucose readings are still under good control, insulin resistance puts her at greater risk of metabolic syndrome and diabetes. She is stable on metformin currently but notes increased evening polyphagia. She continues to work on diet and exercise to decrease risk of diabetes.  At risk for diabetes Miranda Hunter is at higher than averagerisk for developing diabetes due to her obesity. She currently denies polyuria or polydipsia.  Depression with emotional eating behaviors Miranda Hunter is struggling with emotional eating and using food for comfort to the extent that it is negatively impacting her health. She often snacks when she is not hungry. Miranda Hunter sometimes feels she is out of control and then feels guilty that she made poor food choices. She has been working on behavior modification techniques to help reduce her emotional eating and has been somewhat successful. Miranda Hunter reports her mood is stable and denies insomnia. She feels she is doing well with decreased emotional eating. She shows no sign of suicidal or homicidal ideations.  Depression screen  [facility] 12/03/2018 09/03/2017  Decreased Interest 2 0  Down, Depressed, Hopeless 3 0  PHQ - 2 Score 5 0  Altered sleeping 1 -  Tired, decreased energy 3 -  Change in appetite 2 -  Feeling bad or failure about yourself  1 -  Trouble concentrating 1 -  Moving slowly or fidgety/restless 0 -  Suicidal thoughts 0 -  PHQ-9 Score 13 -  Difficult doing work/chores Somewhat difficult -   ASSESSMENT AND PLAN:  Insulin resistance - Plan: Comprehensive metabolic panel, Hemoglobin A1c, Insulin, random, Lipid Panel With LDL/HDL Ratio, VITAMIN D 25 Hydroxy (Vit-D Deficiency, Fractures), metFORMIN (GLUCOPHAGE) 500 MG tablet  Other depression - Plan: buPROPion (WELLBUTRIN SR) 200 MG 12 hr tablet  At risk for diabetes mellitus  Class 2 severe obesity with serious comorbidity and body mass index (BMI) of 35.0 to 35.9 in adult, unspecified obesity type (HCC)  Other depression - with emotional eating - Plan: buPROPion (WELLBUTRIN SR) 200 MG 12 hr tablet  PLAN:  Insulin Resistance Miranda Hunter will continue to work on weight loss, exercise, and decreasing simple carbohydrates in her diet to help decrease the risk of diabetes. We dicussed metformin including benefits and risks. She was informed that eating too many simple carbohydrates or too many calories at one sitting increases the likelihood of GI side effects. Miranda Hunter will increase her metformin to 500 mg BID #60 with 0 refills and agrees to follow-up with our clinic in 2 weeks. She will continue her diet prescription.  Diabetes risk counseling Miranda Hunter was given extended (15 minutes) diabetes prevention counseling today.  She is 33 y.o. female and has risk factors for diabetes including obesity. We discussed intensive lifestyle modifications today with an emphasis on weight loss as well as increasing exercise and decreasing simple carbohydrates in her diet.  Depression with Emotional Eating Behaviors We discussed behavior modification techniques today to  help Miranda Hunter deal with her emotional eating and depression. Miranda Hunter was given a refill on her Wellbutrin 200 mg #30 with 0 refills and agrees to follow-up with our clinic in 2 weeks.  Obesity Miranda Hunter is currently in the action stage of change. As such, her goal is to continue with weight loss efforts. She has agreed to keep a food journal with 1700 calories and 90+ grams of protein.  Miranda Hunter has been instructed to work up to a goal of 150 minutes of combined cardio and strengthening exercise per week for weight loss and overall health benefits. We discussed the following Behavioral Modification Strategies today: increasing lean protein intake, decreasing simple carbohydrates, work on meal planning and easy cooking plans  Miranda Hunter has agreed to follow-up with our clinic in 2 weeks. She was informed of the importance of frequent follow-up visits to maximize her success with intensive lifestyle modifications for her multiple health conditions.  ALLERGIES: Allergies  Allergen Reactions  . Amoxicillin-Pot Clavulanate Diarrhea    MEDICATIONS: Current Outpatient Medications on File Prior to Visit  Medication Sig Dispense Refill  . albuterol (PROVENTIL HFA;VENTOLIN HFA) 108 (90 Base) MCG/ACT inhaler Inhale 2 puffs into the lungs every 6 (six) hours as needed for wheezing or shortness of breath. 1 Inhaler 2  . cetirizine (ZYRTEC) 10 MG tablet Take 10 mg by mouth daily.    . Cholecalciferol (VITAMIN D) 50 MCG (2000 UT) tablet Take 1 tablet (2,000 Units total) by mouth daily. 30 tablet 0  . Flaxseed, Linseed, (FLAX SEEDS PO) Take 1 tablet by mouth at bedtime.    . Insulin Pen Needle (BD PEN NEEDLE NANO 2ND GEN) 32G X 4 MM MISC 1 Package by Does not apply route 2 (two) times daily. 100 each 0  . ketorolac (TORADOL) 10 MG tablet Take 1 tablet (10 mg total) by mouth every 6 (six) hours as needed. 20 tablet 11  . Multiple Vitamin (MULTIVITAMIN) tablet Take 1 tablet by mouth daily.    . norethindrone  (MICRONOR,CAMILA,ERRIN) 0.35 MG tablet Take 1 tablet by mouth daily.    . ondansetron (ZOFRAN-ODT) 4 MG disintegrating tablet Take 1 tablet (4 mg total) by mouth every 8 (eight) hours as needed for nausea. 30 tablet 3  . rizatriptan (MAXALT-MLT) 10 MG disintegrating tablet Take 1 tablet (10 mg total) by mouth as needed for migraine. May repeat in 2 hours if needed 9 tablet 11   No current facility-administered medications on file prior to visit.     PAST MEDICAL HISTORY: Past Medical History:  Diagnosis Date  . Anxiety   . Asthma   . History of chicken pox   . IBS (irritable bowel syndrome)   . Migraines     PAST SURGICAL HISTORY: Past Surgical History:  Procedure Laterality Date  . tonsillectomy and adnoidectomy Bilateral 2007    SOCIAL HISTORY: Social History   Tobacco Use  . Smoking status: Never Smoker  . Smokeless tobacco: Never Used  Substance Use Topics  . Alcohol use: Yes    Comment: socially  . Drug use: No    FAMILY HISTORY: Family History  Problem Relation Age of Onset  . Prostate cancer Father   . Hypertension Father   .  Asthma Sister   . Lung cancer Maternal Grandmother        heavy smoker  . Lung cancer Maternal Grandfather        heavy smoker  . Asthma Sister    ROS: Review of Systems  Endo/Heme/Allergies:       Positive for increased evening polyphagia.  Psychiatric/Behavioral: Positive for depression (emotional eating). Negative for suicidal ideas.       Negative for homicidal ideas.   PHYSICAL EXAM: Blood pressure 106/72, pulse (!) 106, temperature 97.8 F (36.6 C), temperature source Oral, height 5\' 9"  (1.753 m), weight 239 lb (108.4 kg), SpO2 98 %. Body mass index is 35.29 kg/m. Physical Exam Vitals signs reviewed.  Constitutional:      Appearance: Normal appearance. She is obese.  Cardiovascular:     Rate and Rhythm: Normal rate.     Pulses: Normal pulses.  Pulmonary:     Effort: Pulmonary effort is normal.     Breath sounds:  Normal breath sounds.  Musculoskeletal: Normal range of motion.  Skin:    General: Skin is warm and dry.  Neurological:     Mental Status: She is alert and oriented to person, place, and time.  Psychiatric:        Behavior: Behavior normal.   RECENT LABS AND TESTS: BMET    Component Value Date/Time   NA 140 12/03/2018 1002   K 4.8 12/03/2018 1002   CL 102 12/03/2018 1002   CO2 22 12/03/2018 1002   GLUCOSE 81 12/03/2018 1002   GLUCOSE 84 09/03/2017 0923   BUN 5 (L) 12/03/2018 1002   CREATININE 0.67 12/03/2018 1002   CALCIUM 10.1 12/03/2018 1002   GFRNONAA 118 12/03/2018 1002   GFRAA 136 12/03/2018 1002   Lab Results  Component Value Date   HGBA1C 5.5 12/03/2018   Lab Results  Component Value Date   INSULIN 42.1 (H) 12/03/2018   CBC    Component Value Date/Time   WBC 9.8 12/03/2018 1002   WBC 11.7 (H) 09/03/2017 0923   RBC 4.78 12/03/2018 1002   RBC 4.71 09/03/2017 0923   HGB 13.8 12/03/2018 1002   HCT 42.9 12/03/2018 1002   PLT 445.0 (H) 09/03/2017 0923   MCV 90 12/03/2018 1002   MCH 28.9 12/03/2018 1002   MCHC 32.2 12/03/2018 1002   MCHC 32.2 09/03/2017 0923   RDW 13.1 12/03/2018 1002   LYMPHSABS 3.2 (H) 12/03/2018 1002   MONOABS 0.8 09/03/2017 0923   EOSABS 0.2 12/03/2018 1002   BASOSABS 0.0 12/03/2018 1002   Iron/TIBC/Ferritin/ %Sat No results found for: IRON, TIBC, FERRITIN, IRONPCTSAT Lipid Panel     Component Value Date/Time   CHOL 170 12/03/2018 1002   TRIG 242 (H) 12/03/2018 1002   HDL 34 (L) 12/03/2018 1002   LDLCALC 88 12/03/2018 1002   Hepatic Function Panel     Component Value Date/Time   PROT 6.9 12/03/2018 1002   ALBUMIN 4.7 12/03/2018 1002   AST 20 12/03/2018 1002   ALT 26 12/03/2018 1002   ALKPHOS 127 (H) 12/03/2018 1002   BILITOT 0.3 12/03/2018 1002      Component Value Date/Time   TSH 2.150 12/03/2018 1002   TSH 2.45 09/03/2017 0923   Results for NATALIAH, HATLESTAD (MRN [id number]) as of 06/08/2019 11:11  Ref. Range 12/03/2018  10:02  Vitamin D, 25-Hydroxy Latest Ref Range: 30.0 - 100.0 ng/mL 25.8 (L)   OBESITY BEHAVIORAL INTERVENTION VISIT  Today's visit was #12   Starting weight: 257 lbs Starting date:  12/03/2018 Today's weight: 239 lbs  Today's date: 06/08/2019 Total lbs lost to date: 18   06/08/2019  Height 5\' 9"  (1.753 m)  Weight 239 lb (108.4 kg)  BMI (Calculated) 35.28  BLOOD PRESSURE - SYSTOLIC 106  BLOOD PRESSURE - DIASTOLIC 72   Body Fat % 40.2 %  Total Body Water (lbs) 87.8 lbs   ASK: We discussed the diagnosis of obesity with [name] Mcgilvray today and Miranda Hunter agreed to give us permission to discuss obesity behavioral modification therapy today.  ASSESS: Miranda Hunter has the diagnosis of obesity and her BMI today is 35.3. Miranda Hunter is in the action stage of change.   ADVISE: Miranda Hunter was educated on the multiple health risks of obesity as well as the benefit of weight loss to improve her health. She was advised of the need for long term treatment and the importance of lifestyle modifications to improve her current health and to decrease her risk of future health problems.  AGREE: Multiple dietary modification options and treatment options were discussed and  Shaquille agreed to follow the recommendations documented in the above note.  ARRANGE: Miranda Hunter was educated on the importance of frequent visits to treat obesity as outlined per CMS and USPSTF guidelines and agreed to schedule her next follow up appointment today.  I, Marianna Paymentenise Haag, am acting as Energy managertranscriptionist for Quillian Quincearen Jesus Poplin, MD  I have reviewed the above documentation for accuracy and completeness, and I agree with the above. -Quillian Quincearen Milik Gilreath, MD

## 2019-06-09 LAB — INSULIN, RANDOM: INSULIN: 33.7 u[IU]/mL — ABNORMAL HIGH (ref 2.6–24.9)

## 2019-06-09 LAB — COMPREHENSIVE METABOLIC PANEL
ALT: 20 IU/L (ref 0–32)
AST: 16 IU/L (ref 0–40)
Albumin/Globulin Ratio: 2.9 — ABNORMAL HIGH (ref 1.2–2.2)
Albumin: 4.9 g/dL (ref 3.9–5.0)
Alkaline Phosphatase: 111 IU/L (ref 39–117)
BUN/Creatinine Ratio: 8 — ABNORMAL LOW (ref 9–23)
BUN: 6 mg/dL (ref 6–20)
Bilirubin Total: 0.5 mg/dL (ref 0.0–1.2)
CO2: 22 mmol/L (ref 20–29)
Calcium: 9.7 mg/dL (ref 8.7–10.2)
Chloride: 106 mmol/L (ref 96–106)
Creatinine, Ser: 0.73 mg/dL (ref 0.57–1.00)
GFR calc Af Amer: 128 mL/min/{1.73_m2} (ref >59)
GFR calc non Af Amer: 111 mL/min/{1.73_m2} (ref >59)
Globulin, Total: 1.7 g/dL (ref 1.5–4.5)
Glucose: 97 mg/dL (ref 65–99)
Potassium: 5 mmol/L (ref 3.5–5.2)
Sodium: 142 mmol/L (ref 134–144)
Total Protein: 6.6 g/dL (ref 6.0–8.5)

## 2019-06-09 LAB — LIPID PANEL WITH LDL/HDL RATIO
Cholesterol, Total: 177 mg/dL (ref 100–199)
HDL: 32 mg/dL — ABNORMAL LOW (ref >39)
LDL Calculated: 79 mg/dL (ref 0–99)
LDl/HDL Ratio: 2.5 ratio (ref 0.0–3.2)
Triglycerides: 331 mg/dL — ABNORMAL HIGH (ref 0–149)
VLDL Cholesterol Cal: 66 mg/dL — ABNORMAL HIGH (ref 5–40)

## 2019-06-09 LAB — HEMOGLOBIN A1C
Est. average glucose Bld gHb Est-mCnc: 111 mg/dL
Hgb A1c MFr Bld: 5.5 % (ref 4.8–5.6)

## 2019-06-09 LAB — VITAMIN D 25 HYDROXY (VIT D DEFICIENCY, FRACTURES): Vit D, 25-Hydroxy: 46.7 ng/mL (ref 30.0–100.0)

## 2019-06-23 ENCOUNTER — Other Ambulatory Visit: Payer: Self-pay

## 2019-06-23 ENCOUNTER — Ambulatory Visit (INDEPENDENT_AMBULATORY_CARE_PROVIDER_SITE_OTHER): Payer: 59 | Admitting: Family Medicine

## 2019-06-23 ENCOUNTER — Encounter (INDEPENDENT_AMBULATORY_CARE_PROVIDER_SITE_OTHER): Payer: Self-pay | Admitting: Family Medicine

## 2019-06-23 VITALS — BP 107/71 | HR 110 | Temp 97.7°F | Ht 69.0 in | Wt 241.0 lb

## 2019-06-23 DIAGNOSIS — E781 Pure hyperglyceridemia: Secondary | ICD-10-CM | POA: Diagnosis not present

## 2019-06-23 DIAGNOSIS — E8881 Metabolic syndrome: Secondary | ICD-10-CM

## 2019-06-23 DIAGNOSIS — Z6835 Body mass index (BMI) 35.0-35.9, adult: Secondary | ICD-10-CM | POA: Diagnosis not present

## 2019-06-23 NOTE — Progress Notes (Signed)
Office: [phone]  /  Fax: [phone]   HPI:   Chief Complaint: OBESITY Miranda Hunter is here to discuss her progress with her obesity treatment plan. She is keeping a food journal with 1700 calories and 90+ grams of protein and is following her eating plan approximately 50% of the time. She states she is walking the dog 60-90 minutes 6-7 times per week. [name] was on vacation for 10 days and had increased celebration eating. She is up 2 lbs and is ready to get back on track.  Her weight is 241 lb (109.3 kg) today and has had a weight gain of 2 lbs since her last visit. She has lost 16 lbs since starting treatment with [place].  Elevated Triglycerides [name]'s triglycerides are elevated even with decreasing simple carbs. No abdominal pain or jaundice.  Insulin Resistance [name] has a diagnosis of insulin resistance based on her elevated fasting insulin level >5. Although [name]'s blood glucose readings are still under good control, insulin resistance puts her at greater risk of metabolic syndrome and diabetes. She is stable on her diet and denies nausea or vomiting. She is doing well with diet and weight loss overall.  ASSESSMENT AND PLAN:  Hypertriglyceridemia  Insulin resistance  Class 2 severe obesity with serious comorbidity and body mass index (BMI) of 35.0 to 35.9 in adult, unspecified obesity type ([place])  PLAN:  Elevated Triglycerides Chela will continue to decrease simple carbs. Will recheck in 3 months; if still elevated will consider starting Tricor.  Insulin Resistance Luba will continue to work on weight loss, exercise, and decreasing simple carbohydrates in her diet to help decrease the risk of diabetes. We dicussed metformin including benefits and risks. She was informed that eating too many simple carbohydrates or too many calories at one sitting increases the likelihood of GI side effects. Twanda will continue diet and exercise and follow-up as directed to  monitor her progress. Will recheck labs in 3 months.  I spent > than 50% of the 25 minute visit on counseling as documented in the note.  Obesity [name] is currently in the action stage of change. As such, her goal is to continue with weight loss efforts. She has agreed to keep a food journal with 1700 calories and 90+ grams of protein. She will keep sugar below 50 grams.  [name] has been instructed to work up to a goal of 150 minutes of combined cardio and strengthening exercise per week for weight loss and overall health benefits. We discussed the following Behavioral Modification Strategies today: increasing lean protein intake, decreasing simple carbohydrates, increasing vegetables. work on meal planning and easy cooking plans.  [name] has agreed to follow-up with our clinic in 2-3 weeks. She was informed of the importance of frequent follow-up visits to maximize her success with intensive lifestyle modifications for her multiple health conditions.  ALLERGIES: Allergies  Allergen Reactions   Amoxicillin-Pot Clavulanate Diarrhea    MEDICATIONS: Current Outpatient Medications on File Prior to Visit  Medication Sig Dispense Refill   albuterol (PROVENTIL HFA;VENTOLIN HFA) 108 (90 Base) MCG/ACT inhaler Inhale 2 puffs into the lungs every 6 (six) hours as needed for wheezing or shortness of breath. 1 Inhaler 2   buPROPion (WELLBUTRIN SR) 200 MG 12 hr tablet Take 1 tablet (200 mg total) by mouth daily. 30 tablet 0   cetirizine (ZYRTEC) 10 MG tablet Take 10 mg by mouth daily.     Flaxseed, Linseed, (FLAX SEEDS PO) Take 1 tablet by mouth at bedtime.  ketorolac (TORADOL) 10 MG tablet Take 1 tablet (10 mg total) by mouth every 6 (six) hours as needed. 20 tablet 11   metFORMIN (GLUCOPHAGE) 500 MG tablet Take 1 tablet (500 mg total) by mouth 2 (two) times daily with a meal. 60 tablet 0   Multiple Vitamin (MULTIVITAMIN) tablet Take 1 tablet by mouth daily.     norethindrone  (MICRONOR,CAMILA,ERRIN) 0.35 MG tablet Take 1 tablet by mouth daily.     ondansetron (ZOFRAN-ODT) 4 MG disintegrating tablet Take 1 tablet (4 mg total) by mouth every 8 (eight) hours as needed for nausea. 30 tablet 3   rizatriptan (MAXALT-MLT) 10 MG disintegrating tablet Take 1 tablet (10 mg total) by mouth as needed for migraine. May repeat in 2 hours if needed 9 tablet 11   No current facility-administered medications on file prior to visit.     PAST MEDICAL HISTORY: Past Medical History:  Diagnosis Date   Anxiety    Asthma    History of chicken pox    IBS (irritable bowel syndrome)    Migraines     PAST SURGICAL HISTORY: Past Surgical History:  Procedure Laterality Date   tonsillectomy and adnoidectomy Bilateral 2007    SOCIAL HISTORY: Social History   Tobacco Use   Smoking status: Never Smoker   Smokeless tobacco: Never Used  Substance Use Topics   Alcohol use: Yes    Comment: socially   Drug use: No    FAMILY HISTORY: Family History  Problem Relation Age of Onset   Prostate cancer Father    Hypertension Father    Asthma Sister    Lung cancer Maternal Grandmother        heavy smoker   Lung cancer Maternal Grandfather        heavy smoker   Asthma Sister    ROS: Review of Systems  Gastrointestinal: Negative for abdominal pain, nausea and vomiting.  Skin:       Negative for jaundice.   PHYSICAL EXAM: Blood pressure 107/71, pulse (!) 110, temperature 97.7 F (36.5 C), temperature source Oral, height 5\' 9"  (1.753 m), weight 241 lb (109.3 kg), last menstrual period 05/27/2019, SpO2 96 %. Body mass index is 35.59 kg/m. Physical Exam Vitals signs reviewed.  Constitutional:      Appearance: Normal appearance. She is obese.  Cardiovascular:     Rate and Rhythm: Normal rate.     Pulses: Normal pulses.  Pulmonary:     Effort: Pulmonary effort is normal.     Breath sounds: Normal breath sounds.  Musculoskeletal: Normal range of motion.    Skin:    General: Skin is warm and dry.  Neurological:     Mental Status: She is alert and oriented to person, place, and time.  Psychiatric:        Behavior: Behavior normal.   RECENT LABS AND TESTS: BMET    Component Value Date/Time   NA 142 06/08/2019 1011   K 5.0 06/08/2019 1011   CL 106 06/08/2019 1011   CO2 22 06/08/2019 1011   GLUCOSE 97 06/08/2019 1011   GLUCOSE 84 09/03/2017 0923   BUN 6 06/08/2019 1011   CREATININE 0.73 06/08/2019 1011   CALCIUM 9.7 06/08/2019 1011   GFRNONAA 111 06/08/2019 1011   GFRAA 128 06/08/2019 1011   Lab Results  Component Value Date   HGBA1C 5.5 06/08/2019   HGBA1C 5.5 12/03/2018   Lab Results  Component Value Date   INSULIN 33.7 (H) 06/08/2019   INSULIN 42.1 (H) 12/03/2018  CBC    Component Value Date/Time   WBC 9.8 12/03/2018 1002   WBC 11.7 (H) 09/03/2017 0923   RBC 4.78 12/03/2018 1002   RBC 4.71 09/03/2017 0923   HGB 13.8 12/03/2018 1002   HCT 42.9 12/03/2018 1002   PLT 445.0 (H) 09/03/2017 0923   MCV 90 12/03/2018 1002   MCH 28.9 12/03/2018 1002   MCHC 32.2 12/03/2018 1002   MCHC 32.2 09/03/2017 0923   RDW 13.1 12/03/2018 1002   LYMPHSABS 3.2 (H) 12/03/2018 1002   MONOABS 0.8 09/03/2017 0923   EOSABS 0.2 12/03/2018 1002   BASOSABS 0.0 12/03/2018 1002   Iron/TIBC/Ferritin/ %Sat No results found for: IRON, TIBC, FERRITIN, IRONPCTSAT Lipid Panel     Component Value Date/Time   CHOL 177 06/08/2019 1011   TRIG 331 (H) 06/08/2019 1011   HDL 32 (L) 06/08/2019 1011   LDLCALC 79 06/08/2019 1011   Hepatic Function Panel     Component Value Date/Time   PROT 6.6 06/08/2019 1011   ALBUMIN 4.9 06/08/2019 1011   AST 16 06/08/2019 1011   ALT 20 06/08/2019 1011   ALKPHOS 111 06/08/2019 1011   BILITOT 0.5 06/08/2019 1011      Component Value Date/Time   TSH 2.150 12/03/2018 1002   TSH 2.45 09/03/2017 0923   Results for Karma GanjaGARMAN, Merideth (MRN [id number]) as of 06/23/2019 16:20  Ref. Range 06/08/2019 10:11  Vitamin  D, 25-Hydroxy Latest Ref Range: 30.0 - 100.0 ng/mL 46.7   OBESITY BEHAVIORAL INTERVENTION VISIT  Today's visit was #13  Starting weight: 257 lbs Starting date: 12/03/2018 Today's weight: 241 lbs  Today's date: 06/23/2019 Total lbs lost to date: 16   06/23/2019  Height 5\' 9"  (1.753 m)  Weight 241 lb (109.3 kg)  BMI (Calculated) 35.57  BLOOD PRESSURE - SYSTOLIC 107  BLOOD PRESSURE - DIASTOLIC 71   Body Fat % 40.5 %  Total Body Water (lbs) 91.2 lbs   ASK: We discussed the diagnosis of obesity with [name] Pizzi today and [name] agreed to give us permission to discuss obesity behavioral modification therapy today.  ASSESS: [name] has the diagnosis of obesity and her BMI today is 35.7. [name] is in the action stage of change.   ADVISE: [name] was educated on the multiple health risks of obesity as well as the benefit of weight loss to improve her health. She was advised of the need for long term treatment and the importance of lifestyle modifications to improve her current health and to decrease her risk of future health problems.  AGREE: Multiple dietary modification options and treatment options were discussed and  Ella agreed to follow the recommendations documented in the above note.  ARRANGE: [name] was educated on the importance of frequent visits to treat obesity as outlined per CMS and USPSTF guidelines and agreed to schedule her next follow up appointment today.  I, Marianna Paymentenise Haag, am acting as Energy managertranscriptionist for Quillian Quincearen Nollan Muldrow, MD  I have reviewed the above documentation for accuracy and completeness, and I agree with the above. -Quillian Quincearen Yanina Knupp, MD

## 2019-07-04 ENCOUNTER — Other Ambulatory Visit (INDEPENDENT_AMBULATORY_CARE_PROVIDER_SITE_OTHER): Payer: Self-pay | Admitting: Family Medicine

## 2019-07-04 DIAGNOSIS — F3289 Other specified depressive episodes: Secondary | ICD-10-CM

## 2019-07-04 DIAGNOSIS — E8881 Metabolic syndrome: Secondary | ICD-10-CM

## 2019-07-14 ENCOUNTER — Ambulatory Visit (INDEPENDENT_AMBULATORY_CARE_PROVIDER_SITE_OTHER): Payer: 59 | Admitting: Family Medicine

## 2019-07-14 ENCOUNTER — Encounter (INDEPENDENT_AMBULATORY_CARE_PROVIDER_SITE_OTHER): Payer: Self-pay | Admitting: Family Medicine

## 2019-07-14 ENCOUNTER — Other Ambulatory Visit: Payer: Self-pay

## 2019-07-14 VITALS — BP 109/75 | HR 94 | Temp 97.6°F | Ht 69.0 in | Wt 241.0 lb

## 2019-07-14 DIAGNOSIS — E8881 Metabolic syndrome: Secondary | ICD-10-CM | POA: Diagnosis not present

## 2019-07-14 DIAGNOSIS — F3289 Other specified depressive episodes: Secondary | ICD-10-CM | POA: Diagnosis not present

## 2019-07-14 DIAGNOSIS — Z9189 Other specified personal risk factors, not elsewhere classified: Secondary | ICD-10-CM | POA: Diagnosis not present

## 2019-07-14 DIAGNOSIS — Z6835 Body mass index (BMI) 35.0-35.9, adult: Secondary | ICD-10-CM

## 2019-07-14 MED ORDER — RYBELSUS 3 MG PO TABS: 3.0000 mg | ORAL_TABLET | Freq: Every day | ORAL | 0 refills | Status: DC

## 2019-07-15 NOTE — Progress Notes (Signed)
Office: [phone]  /  Fax: [phone]   HPI:   Chief Complaint: OBESITY Miranda Hunter is here to discuss her progress with her obesity treatment plan. She is keeping a food journal with 1700 calories and 90+ grams of protein and is following her eating plan approximately 70% of the time. She states she is walking 60 minutes 7 times per week. Miranda Hunter has done well maintaining her weight. She did have some celebration eating and notes going off track more on weekends, especially with alcohol. Her weight is 241 lb (109.3 kg) today and has not lost weight since her last visit. She has lost 16 lbs since starting treatment with us.  Insulin Resistance Miranda Hunter has a diagnosis of insulin resistance based on her elevated fasting insulin level >5. Although Miranda Hunter's blood glucose readings are still under good control, insulin resistance puts her at greater risk of metabolic syndrome and diabetes. She continues to work on diet and exercise to decrease risk of diabetes. [place] notes increased polyphagia, worse on weekends, worse with increased alcohol.  At risk for diabetes Miranda Hunter is at higher than average risk for developing diabetes due to her obesity. She currently denies polyuria or polydipsia.  Depression with emotional eating behaviors Miranda Hunter is struggling with emotional eating and using food for comfort to the extent that it is negatively impacting her health. She often snacks when she is not hungry. Miranda Hunter sometimes feels she is out of control and then feels guilty that she made poor food choices. She has been working on behavior modification techniques to help reduce her emotional eating and has been somewhat successful. Miranda Hunter notes struggling with emotional eating, worse on weekends, and feels this is worse since COVID-19.  She shows no sign of suicidal or homicidal ideations.  [facility] 12/03/2018 09/03/2017  Decreased Interest 2 0  Down, Depressed, Hopeless 3 0   PHQ - 2 Score 5 0  Altered sleeping 1 -  Tired, decreased energy 3 -  Change in appetite 2 -  Feeling bad or failure about yourself  1 -  Trouble concentrating 1 -  Moving slowly or fidgety/restless 0 -  Suicidal thoughts 0 -  PHQ-9 Score 13 -  Difficult doing work/chores Somewhat difficult -   ASSESSMENT AND PLAN:  Insulin resistance  Other depression - Plan: Semaglutide (RYBELSUS) 3 MG TABS  At risk for diabetes mellitus  Class 2 severe obesity with serious comorbidity and body mass index (BMI) of 35.0 to 35.9 in adult, unspecified obesity type (HCC)  PLAN:  Insulin Resistance Miranda Hunter will continue to work on weight loss, exercise, and decreasing simple carbohydrates in her diet to help decrease the risk of diabetes. We dicussed metformin including benefits and risks. She was informed that eating too many simple carbohydrates or too many calories at one sitting increases the likelihood of GI side effects. Miranda Hunter will start Rybelsus 3 mg QD #30 with 0 refills and agrees to follow-up with our clinic in 2 weeks. She was instructed to decrease alcohol and simple carbs.  Diabetes risk counseling Miranda Hunter was given extended (15 minutes) diabetes prevention counseling today. She is 33 y.o. female and has risk factors for diabetes including obesity. We discussed intensive lifestyle modifications today with an emphasis on weight loss as well as increasing exercise and decreasing simple carbohydrates in her diet.  Depression with Emotional Eating Behaviors We discussed behavior modification techniques today to help Miranda Hunter deal with her emotional eating and depression. Miranda Hunter will continue Wellbutrin as is and CBT  to decrease emotional eating.  Obesity Miranda Hunter is currently in the action stage of change. As such, her goal is to continue with weight loss efforts. She has agreed to keep a food journal with 1500-1700 calories and 90+ grams of protein. Miranda Hunter will have RMR rechecked  at her next visit.  Miranda Hunter has been instructed to work up to a goal of 150 minutes of combined cardio and strengthening exercise per week for weight loss and overall health benefits. We discussed the following Behavioral Modification Strategies today: decrease liquid calories, decrease ETOH, and emotional eating strategies.  Miranda Hunter has agreed to follow-up with our clinic in 2 weeks. She was informed of the importance of frequent follow-up visits to maximize her success with intensive lifestyle modifications for her multiple health conditions.  ALLERGIES: Allergies  Allergen Reactions  . Amoxicillin-Pot Clavulanate Diarrhea    MEDICATIONS: Current Outpatient Medications on File Prior to Visit  Medication Sig Dispense Refill  . albuterol (PROVENTIL HFA;VENTOLIN HFA) 108 (90 Base) MCG/ACT inhaler Inhale 2 puffs into the lungs every 6 (six) hours as needed for wheezing or shortness of breath. 1 Inhaler 2  . buPROPion (WELLBUTRIN SR) 200 MG 12 hr tablet TAKE 1 TABLET BY MOUTH EVERY DAY 30 tablet 0  . cetirizine (ZYRTEC) 10 MG tablet Take 10 mg by mouth daily.    . Flaxseed, Linseed, (FLAX SEEDS PO) Take 1 tablet by mouth at bedtime.    Marland Kitchen. ketorolac (TORADOL) 10 MG tablet Take 1 tablet (10 mg total) by mouth every 6 (six) hours as needed. 20 tablet 11  . metFORMIN (GLUCOPHAGE) 500 MG tablet Take 1 tablet (500 mg total) by mouth 2 (two) times daily with a meal. 60 tablet 0  . Multiple Vitamin (MULTIVITAMIN) tablet Take 1 tablet by mouth daily.    . norethindrone (MICRONOR,CAMILA,ERRIN) 0.35 MG tablet Take 1 tablet by mouth daily.    . ondansetron (ZOFRAN-ODT) 4 MG disintegrating tablet Take 1 tablet (4 mg total) by mouth every 8 (eight) hours as needed for nausea. 30 tablet 3  . rizatriptan (MAXALT-MLT) 10 MG disintegrating tablet Take 1 tablet (10 mg total) by mouth as needed for migraine. May repeat in 2 hours if needed 9 tablet 11   No current facility-administered medications on file prior  to visit.     PAST MEDICAL HISTORY: Past Medical History:  Diagnosis Date  . Anxiety   . Asthma   . History of chicken pox   . IBS (irritable bowel syndrome)   . Migraines     PAST SURGICAL HISTORY: Past Surgical History:  Procedure Laterality Date  . tonsillectomy and adnoidectomy Bilateral 2007    SOCIAL HISTORY: Social History   Tobacco Use  . Smoking status: Never Smoker  . Smokeless tobacco: Never Used  Substance Use Topics  . Alcohol use: Yes    Comment: socially  . Drug use: No    FAMILY HISTORY: Family History  Problem Relation Age of Onset  . Prostate cancer Father   . Hypertension Father   . Asthma Sister   . Lung cancer Maternal Grandmother        heavy smoker  . Lung cancer Maternal Grandfather        heavy smoker  . Asthma Sister    ROS: Review of Systems  Endo/Heme/Allergies:       Positive for increased polyphagia.  Psychiatric/Behavioral: Positive for depression (emotional eating). Negative for suicidal ideas.       Negative for homicidal ideas.   PHYSICAL EXAM: Blood  pressure 109/75, pulse 94, temperature 97.6 F (36.4 C), temperature source Oral, height 5\' 9"  (1.753 m), weight 241 lb (109.3 kg), last menstrual period 06/30/2019, SpO2 96 %. Body mass index is 35.59 kg/m. Physical Exam Vitals signs reviewed.  Constitutional:      Appearance: Normal appearance. She is obese.  Cardiovascular:     Rate and Rhythm: Normal rate.     Pulses: Normal pulses.  Pulmonary:     Effort: Pulmonary effort is normal.     Breath sounds: Normal breath sounds.  Musculoskeletal: Normal range of motion.  Skin:    General: Skin is warm and dry.  Neurological:     Mental Status: She is alert and oriented to person, place, and time.  Psychiatric:        Behavior: Behavior normal.   RECENT LABS AND TESTS: BMET    Component Value Date/Time   NA 142 06/08/2019 1011   K 5.0 06/08/2019 1011   CL 106 06/08/2019 1011   CO2 22 06/08/2019 1011   GLUCOSE  97 06/08/2019 1011   GLUCOSE 84 09/03/2017 0923   BUN 6 06/08/2019 1011   CREATININE 0.73 06/08/2019 1011   CALCIUM 9.7 06/08/2019 1011   GFRNONAA 111 06/08/2019 1011   GFRAA 128 06/08/2019 1011   Lab Results  Component Value Date   HGBA1C 5.5 06/08/2019   HGBA1C 5.5 12/03/2018   Lab Results  Component Value Date   INSULIN 33.7 (H) 06/08/2019   INSULIN 42.1 (H) 12/03/2018   CBC    Component Value Date/Time   WBC 9.8 12/03/2018 1002   WBC 11.7 (H) 09/03/2017 0923   RBC 4.78 12/03/2018 1002   RBC 4.71 09/03/2017 0923   HGB 13.8 12/03/2018 1002   HCT 42.9 12/03/2018 1002   PLT 445.0 (H) 09/03/2017 0923   MCV 90 12/03/2018 1002   MCH 28.9 12/03/2018 1002   MCHC 32.2 12/03/2018 1002   MCHC 32.2 09/03/2017 0923   RDW 13.1 12/03/2018 1002   LYMPHSABS 3.2 (H) 12/03/2018 1002   MONOABS 0.8 09/03/2017 0923   EOSABS 0.2 12/03/2018 1002   BASOSABS 0.0 12/03/2018 1002   Iron/TIBC/Ferritin/ %Sat No results found for: IRON, TIBC, FERRITIN, IRONPCTSAT Lipid Panel     Component Value Date/Time   CHOL 177 06/08/2019 1011   TRIG 331 (H) 06/08/2019 1011   HDL 32 (L) 06/08/2019 1011   LDLCALC 79 06/08/2019 1011   Hepatic Function Panel     Component Value Date/Time   PROT 6.6 06/08/2019 1011   ALBUMIN 4.9 06/08/2019 1011   AST 16 06/08/2019 1011   ALT 20 06/08/2019 1011   ALKPHOS 111 06/08/2019 1011   BILITOT 0.5 06/08/2019 1011      Component Value Date/Time   TSH 2.150 12/03/2018 1002   TSH 2.45 09/03/2017 0923   Results for LEASA, KINCANNON (MRN [id number]) as of 07/15/2019 15:44  Ref. Range 06/08/2019 10:11  Vitamin D, 25-Hydroxy Latest Ref Range: 30.0 - 100.0 ng/mL 46.7   OBESITY BEHAVIORAL INTERVENTION VISIT  Today's visit was #14   Starting weight: 257 lbs Starting date: 12/03/2018 Today's weight: 241 lbs  Today's date: 07/14/2019 Total lbs lost to date: 16    07/14/2019  Height 5\' 9"  (1.753 m)  Weight 241 lb (109.3 kg)  BMI (Calculated) 35.57  BLOOD  PRESSURE - SYSTOLIC 297  BLOOD PRESSURE - DIASTOLIC 75   Body Fat % 98.9 %  Total Body Water (lbs) 89.6 lbs   ASK: We discussed the diagnosis of obesity with Miranda Bar  Conception Hunter today and Miranda Hunter agreed to give us permission to discuss obesity behavioral modification therapy today.  ASSESS: Miranda Hunter has the diagnosis of obesity and her BMI today is 35.6. Miranda Hunter is in the action stage of change.   ADVISE: Miranda Hunter was educated on the multiple health risks of obesity as well as the benefit of weight loss to improve her health. She was advised of the need for long term treatment and the importance of lifestyle modifications to improve her current health and to decrease her risk of future health problems.  AGREE: Multiple dietary modification options and treatment options were discussed and  Miranda Hunter agreed to follow the recommendations documented in the above note.  ARRANGE: Miranda Hunter was educated on the importance of frequent visits to treat obesity as outlined per CMS and USPSTF guidelines and agreed to schedule her next follow up appointment today.  I, Marianna Paymentenise Haag, am acting as Energy managertranscriptionist for Quillian Quincearen Beasley, MD I have reviewed the above documentation for accuracy and completeness, and I agree with the above. -Quillian Quincearen Beasley, MD

## 2019-07-21 ENCOUNTER — Other Ambulatory Visit (INDEPENDENT_AMBULATORY_CARE_PROVIDER_SITE_OTHER): Payer: Self-pay | Admitting: Family Medicine

## 2019-07-21 DIAGNOSIS — F3289 Other specified depressive episodes: Secondary | ICD-10-CM

## 2019-07-30 ENCOUNTER — Encounter (INDEPENDENT_AMBULATORY_CARE_PROVIDER_SITE_OTHER): Payer: Self-pay | Admitting: Family Medicine

## 2019-07-30 ENCOUNTER — Other Ambulatory Visit: Payer: Self-pay

## 2019-07-30 ENCOUNTER — Ambulatory Visit (INDEPENDENT_AMBULATORY_CARE_PROVIDER_SITE_OTHER): Payer: 59 | Admitting: Family Medicine

## 2019-07-30 VITALS — BP 108/75 | HR 99 | Temp 98.5°F | Ht 69.0 in | Wt 241.0 lb

## 2019-07-30 DIAGNOSIS — Z6835 Body mass index (BMI) 35.0-35.9, adult: Secondary | ICD-10-CM

## 2019-07-30 DIAGNOSIS — E8881 Metabolic syndrome: Secondary | ICD-10-CM | POA: Diagnosis not present

## 2019-07-30 DIAGNOSIS — Z9189 Other specified personal risk factors, not elsewhere classified: Secondary | ICD-10-CM

## 2019-07-30 DIAGNOSIS — F3289 Other specified depressive episodes: Secondary | ICD-10-CM | POA: Diagnosis not present

## 2019-07-30 MED ORDER — BUPROPION HCL ER (SR) 200 MG PO TB12: 200.0000 mg | ORAL_TABLET | Freq: Every day | ORAL | 0 refills | Status: DC

## 2019-08-04 NOTE — Progress Notes (Signed)
Office: [phone]  /  Fax: [phone]   HPI:   Chief Complaint: OBESITY Miranda Hunter is here to discuss her progress with her obesity treatment plan. She is on the keep a food journal with 1500 to 1700 calories and 90+ grams of protein daily plan and she is following her eating plan approximately 90 to 92 % of the time. She states she is walking the dog 60 minutes 7 times per week. Orva has done well with stopping her weight gain, and she has gotten back on track with journaling. She will be training soon and would like to discuss travel and celebration eating strategies. Her weight is 241 lb (109.3 kg) today and she has maintained weight since her last visit. She has lost 16 lbs since starting treatment with [place].  Insulin Resistance Miranda Hunter has a diagnosis of insulin resistance based on her elevated fasting insulin level >5. Although Miranda Hunter's blood glucose readings are still under good control, insulin resistance puts her at greater risk of metabolic syndrome and diabetes. She hasn't started Rybelsus yet, due to a delay in getting the medications by insurance. Miranda Hunter is working on diet and she is planning on starting now.  She continues to work on diet and exercise to decrease risk of diabetes.  At risk for diabetes Miranda Hunter is at higher than average risk for developing diabetes due to her obesity and insulin resistance. She currently denies polyuria or polydipsia.  Depression with emotional eating behaviors Miranda Hunter is struggling with emotional eating and using food for comfort to the extent that it is negatively impacting her health. She often snacks when she is not hungry. Miranda Hunter sometimes feels she is out of control and then feels guilty that she made poor food choices. Her mood is stable on Wellbutrin and patient feels she is doing better avoiding stress and comfort eating. She has been working on behavior modification techniques to help reduce her emotional eating and has been  somewhat successful. Miranda Hunter denies insomnia. She shows no sign of suicidal or homicidal ideations.  ASSESSMENT AND PLAN:  Insulin resistance  Other depression - with emotional eating - Plan: buPROPion (WELLBUTRIN SR) 200 MG 12 hr tablet  At risk for diabetes mellitus  Class 2 severe obesity with serious comorbidity and body mass index (BMI) of 35.0 to 35.9 in adult, unspecified obesity type (Miranda Hunter)  PLAN:  Insulin Resistance Miranda Hunter will continue to work on weight loss, exercise, and decreasing simple carbohydrates in her diet to help decrease the risk of diabetes. She was informed that eating too many simple carbohydrates or too many calories at one sitting increases the likelihood of GI side effects. Krishana agrees to continue diet and Rybelsus and we will continue to follow. Leanor will follow up with [place] as directed to monitor her progress.  Diabetes risk counseling Miranda Hunter was given extended (15 minutes) diabetes prevention counseling today. She is 33 y.o. female and has risk factors for diabetes including obesity and insulin resistance. We discussed intensive lifestyle modifications today with an emphasis on weight loss as well as increasing exercise and decreasing simple carbohydrates in her diet.  Depression with Emotional Eating Behaviors We discussed behavior modification techniques today to help Miranda Hunter deal with her emotional eating and depression. She has agreed to continue Wellbutrin SR 200 mg daily #30 with no refills and follow up as directed.  Obesity Miranda Hunter is currently in the action stage of change. As such, her goal is to continue with weight loss efforts She has agreed to keep a  food journal with 1500 to 1700 calories and 90+ grams of protein daily Miranda Hunter has been instructed to work up to a goal of 150 minutes of combined cardio and strengthening exercise per week for weight loss and overall health benefits. We discussed the following Behavioral Modification  Strategies today: keep a strict food journal, increasing lean protein intake, celebration eating strategies and travel eating strategies   Miranda Hunter has agreed to follow up with our clinic in 3 weeks. She was informed of the importance of frequent follow up visits to maximize her success with intensive lifestyle modifications for her multiple health conditions.  ALLERGIES: Allergies  Allergen Reactions  . Amoxicillin-Pot Clavulanate Diarrhea    MEDICATIONS: Current Outpatient Medications on File Prior to Visit  Medication Sig Dispense Refill  . albuterol (PROVENTIL HFA;VENTOLIN HFA) 108 (90 Base) MCG/ACT inhaler Inhale 2 puffs into the lungs every 6 (six) hours as needed for wheezing or shortness of breath. 1 Inhaler 2  . cetirizine (ZYRTEC) 10 MG tablet Take 10 mg by mouth daily.    . Flaxseed, Linseed, (FLAX SEEDS PO) Take 1 tablet by mouth at bedtime.    Marland Kitchen ketorolac (TORADOL) 10 MG tablet Take 1 tablet (10 mg total) by mouth every 6 (six) hours as needed. 20 tablet 11  . metFORMIN (GLUCOPHAGE) 500 MG tablet Take 1 tablet (500 mg total) by mouth 2 (two) times daily with a meal. 60 tablet 0  . Multiple Vitamin (MULTIVITAMIN) tablet Take 1 tablet by mouth daily.    . norethindrone (MICRONOR,CAMILA,ERRIN) 0.35 MG tablet Take 1 tablet by mouth daily.    . ondansetron (ZOFRAN-ODT) 4 MG disintegrating tablet Take 1 tablet (4 mg total) by mouth every 8 (eight) hours as needed for nausea. 30 tablet 3  . rizatriptan (MAXALT-MLT) 10 MG disintegrating tablet Take 1 tablet (10 mg total) by mouth as needed for migraine. May repeat in 2 hours if needed 9 tablet 11  . Semaglutide (RYBELSUS) 3 MG TABS Take 3 mg by mouth daily. 30 tablet 0   No current facility-administered medications on file prior to visit.     PAST MEDICAL HISTORY: Past Medical History:  Diagnosis Date  . Anxiety   . Asthma   . History of chicken pox   . IBS (irritable bowel syndrome)   . Migraines     PAST SURGICAL HISTORY:  Past Surgical History:  Procedure Laterality Date  . tonsillectomy and adnoidectomy Bilateral 2007    SOCIAL HISTORY: Social History   Tobacco Use  . Smoking status: Never Smoker  . Smokeless tobacco: Never Used  Substance Use Topics  . Alcohol use: Yes    Comment: socially  . Drug use: No    FAMILY HISTORY: Family History  Problem Relation Age of Onset  . Prostate cancer Father   . Hypertension Father   . Asthma Sister   . Lung cancer Maternal Grandmother        heavy smoker  . Lung cancer Maternal Grandfather        heavy smoker  . Asthma Sister     ROS: Review of Systems  Constitutional: Negative for weight loss.  Genitourinary: Negative for frequency.  Endo/Heme/Allergies: Negative for polydipsia.  Psychiatric/Behavioral: Positive for depression. Negative for suicidal ideas. The patient does not have insomnia.     PHYSICAL EXAM: Blood pressure 108/75, pulse 99, temperature 98.5 F (36.9 C), temperature source Oral, height 5\' 9"  (1.753 m), weight 241 lb (109.3 kg), last menstrual period 06/30/2019, SpO2 96 %. Body mass index  is 35.59 kg/m. Physical Exam Vitals signs reviewed.  Constitutional:      Appearance: Normal appearance. She is well-developed. She is obese.  Cardiovascular:     Rate and Rhythm: Normal rate.  Pulmonary:     Effort: Pulmonary effort is normal.  Musculoskeletal: Normal range of motion.  Skin:    General: Skin is warm and dry.  Neurological:     Mental Status: She is alert and oriented to person, place, and time.  Psychiatric:        Mood and Affect: Mood normal.        Behavior: Behavior normal.        Thought Content: Thought content does not include homicidal or suicidal ideation.     RECENT LABS AND TESTS: BMET    Component Value Date/Time   NA 142 06/08/2019 1011   K 5.0 06/08/2019 1011   CL 106 06/08/2019 1011   CO2 22 06/08/2019 1011   GLUCOSE 97 06/08/2019 1011   GLUCOSE 84 09/03/2017 0923   BUN 6 06/08/2019 1011    CREATININE 0.73 06/08/2019 1011   CALCIUM 9.7 06/08/2019 1011   GFRNONAA 111 06/08/2019 1011   GFRAA 128 06/08/2019 1011   Lab Results  Component Value Date   HGBA1C 5.5 06/08/2019   HGBA1C 5.5 12/03/2018   Lab Results  Component Value Date   INSULIN 33.7 (H) 06/08/2019   INSULIN 42.1 (H) 12/03/2018   CBC    Component Value Date/Time   WBC 9.8 12/03/2018 1002   WBC 11.7 (H) 09/03/2017 0923   RBC 4.78 12/03/2018 1002   RBC 4.71 09/03/2017 0923   HGB 13.8 12/03/2018 1002   HCT 42.9 12/03/2018 1002   PLT 445.0 (H) 09/03/2017 0923   MCV 90 12/03/2018 1002   MCH 28.9 12/03/2018 1002   MCHC 32.2 12/03/2018 1002   MCHC 32.2 09/03/2017 0923   RDW 13.1 12/03/2018 1002   LYMPHSABS 3.2 (H) 12/03/2018 1002   MONOABS 0.8 09/03/2017 0923   EOSABS 0.2 12/03/2018 1002   BASOSABS 0.0 12/03/2018 1002   Iron/TIBC/Ferritin/ %Sat No results found for: IRON, TIBC, FERRITIN, IRONPCTSAT Lipid Panel     Component Value Date/Time   CHOL 177 06/08/2019 1011   TRIG 331 (H) 06/08/2019 1011   HDL 32 (L) 06/08/2019 1011   LDLCALC 79 06/08/2019 1011   Hepatic Function Panel     Component Value Date/Time   PROT 6.6 06/08/2019 1011   ALBUMIN 4.9 06/08/2019 1011   AST 16 06/08/2019 1011   ALT 20 06/08/2019 1011   ALKPHOS 111 06/08/2019 1011   BILITOT 0.5 06/08/2019 1011      Component Value Date/Time   TSH 2.150 12/03/2018 1002   TSH 2.45 09/03/2017 0923     Ref. Range 06/08/2019 10:11  Vitamin D, 25-Hydroxy Latest Ref Range: 30.0 - 100.0 ng/mL 46.7    OBESITY BEHAVIORAL INTERVENTION VISIT  Today's visit was # 15   Starting weight: 257 lbs Starting date: 12/03/2018 Today's weight : 241 lbs  Today's date: 07/30/2019 Total lbs lost to date: 16    07/30/2019  Height 5\' 9"  (1.753 m)  Weight 241 lb (109.3 kg)  BMI (Calculated) 35.57  BLOOD PRESSURE - SYSTOLIC 108  BLOOD PRESSURE - DIASTOLIC 75   Body Fat % 40.7 %  Total Body Water (lbs) 88.4 lbs    ASK: We discussed the  diagnosis of obesity with Miranda Hunter today and Miranda Hunter agreed to give us permission to discuss obesity behavioral modification therapy today.  ASSESS:  Miranda Hunter has the diagnosis of obesity and her BMI today is 35.57 Miranda Hunter is in the action stage of change   ADVISE: Miranda Hunter was educated on the multiple health risks of obesity as well as the benefit of weight loss to improve her health. She was advised of the need for long term treatment and the importance of lifestyle modifications to improve her current health and to decrease her risk of future health problems.  AGREE: Multiple dietary modification options and treatment options were discussed and  Miranda Hunter agreed to follow the recommendations documented in the above note.  ARRANGE: Miranda Hunter was educated on the importance of frequent visits to treat obesity as outlined per CMS and USPSTF guidelines and agreed to schedule her next follow up appointment today.  I, [place] CraneJoanne Murray, am acting as transcriptionist for Quillian Quincearen Andros Channing, MD  I have reviewed the above documentation for accuracy and completeness, and I agree with the above. -Quillian Quincearen Deamonte Sayegh, MD

## 2019-08-19 ENCOUNTER — Other Ambulatory Visit: Payer: Self-pay

## 2019-08-19 ENCOUNTER — Telehealth (INDEPENDENT_AMBULATORY_CARE_PROVIDER_SITE_OTHER): Payer: 59 | Admitting: Family Medicine

## 2019-08-19 ENCOUNTER — Encounter (INDEPENDENT_AMBULATORY_CARE_PROVIDER_SITE_OTHER): Payer: Self-pay | Admitting: Family Medicine

## 2019-08-19 DIAGNOSIS — Z6835 Body mass index (BMI) 35.0-35.9, adult: Secondary | ICD-10-CM | POA: Diagnosis not present

## 2019-08-19 DIAGNOSIS — E8881 Metabolic syndrome: Secondary | ICD-10-CM | POA: Diagnosis not present

## 2019-08-19 MED ORDER — METFORMIN HCL 500 MG PO TABS: 500.0000 mg | ORAL_TABLET | Freq: Two times a day (BID) | ORAL | 0 refills | Status: DC

## 2019-08-24 NOTE — Progress Notes (Signed)
Office: [phone]  /  Fax: [phone] TeleHealth Visit:  Miranda Hunter has verbally consented to this TeleHealth visit today. The patient is located at home, the provider is located at the [employer] [facility] office. The participants in this visit include the listed provider and patient and any and all parties involved. The visit was conducted today via FaceTime.  HPI:   Chief Complaint: OBESITY [name] is here to discuss her progress with her obesity treatment plan. She is on the keep a food journal with 1500 to 1700 calories and 90+ grams of protein daily plan and is following her eating plan approximately 60 % of the time. She states she is walking 60 minutes 7 times per week. [name] has increased celebration eating and drinking last week while attending her friends wedding. She is back in town and she is back to her eating plan. Hunger is controlled. We were unable to weigh the patient today for this TeleHealth visit. She feels as if she has maintained weight since her last visit (weight not reported). She has lost 16 lbs since starting treatment with [place].  Insulin Resistance [name] has a diagnosis of insulin resistance based on her elevated fasting insulin level >5. Although [name]'s blood glucose readings are still under good control, insulin resistance puts her at greater risk of metabolic syndrome and diabetes. [name] is stable on metformin. [name] indulged more last week, but she is back on track with her diet. She just picked up her Rybelsus and she plans to start it tomorrow. She continues to work on diet and exercise to decrease risk of diabetes.  ASSESSMENT AND PLAN:  Class 2 severe obesity with serious comorbidity and body mass index (BMI) of 35.0 to 35.9 in adult, unspecified obesity type (HCC)  Insulin resistance - Plan: metFORMIN (GLUCOPHAGE) 500 MG tablet  PLAN:  Insulin Resistance Aidah will continue to work on weight loss, exercise, and  decreasing simple carbohydrates in her diet to help decrease the risk of diabetes. We dicussed metformin including benefits and risks. She was informed that eating too many simple carbohydrates or too many calories at one sitting increases the likelihood of GI side effects. Chirstina agrees to continue metformin 500 mg two times daily #60 with no refills and start Rybelsus. [name] agrees to follow up with [place] as directed to monitor her progress.  Obesity [name] is currently in the action stage of change. As such, her goal is to continue with weight loss efforts She has agreed to keep a food journal with 1500 to 1700 calories and 90+ grams of protein daily [name] has been instructed to work up to a goal of 150 minutes of combined cardio and strengthening exercise per week for weight loss and overall health benefits. We discussed the following Behavioral Modification Strategies today: increasing lean protein intake, decreasing simple carbohydrates, increasing vegetables and work on meal planning and easy cooking plans  [name] has agreed to follow up with our clinic in 2 to 3 weeks. She was informed of the importance of frequent follow up visits to maximize her success with intensive lifestyle modifications for her multiple health conditions.  ALLERGIES: Allergies  Allergen Reactions   Amoxicillin-Pot Clavulanate Diarrhea    MEDICATIONS: Current Outpatient Medications on File Prior to Visit  Medication Sig Dispense Refill   albuterol (PROVENTIL HFA;VENTOLIN HFA) 108 (90 Base) MCG/ACT inhaler Inhale 2 puffs into the lungs every 6 (six) hours as needed for wheezing or shortness of breath. 1 Inhaler 2   buPROPion [facility]  SR) 200 MG 12 hr tablet Take 1 tablet (200 mg total) by mouth daily. 30 tablet 0   cetirizine (ZYRTEC) 10 MG tablet Take 10 mg by mouth daily.     Flaxseed, Linseed, (FLAX SEEDS PO) Take 1 tablet by mouth at bedtime.     ketorolac (TORADOL) 10 MG tablet Take 1  tablet (10 mg total) by mouth every 6 (six) hours as needed. 20 tablet 11   Multiple Vitamin (MULTIVITAMIN) tablet Take 1 tablet by mouth daily.     norethindrone (MICRONOR,CAMILA,ERRIN) 0.35 MG tablet Take 1 tablet by mouth daily.     ondansetron (ZOFRAN-ODT) 4 MG disintegrating tablet Take 1 tablet (4 mg total) by mouth every 8 (eight) hours as needed for nausea. 30 tablet 3   rizatriptan (MAXALT-MLT) 10 MG disintegrating tablet Take 1 tablet (10 mg total) by mouth as needed for migraine. May repeat in 2 hours if needed 9 tablet 11   Semaglutide (RYBELSUS) 3 MG TABS Take 3 mg by mouth daily. 30 tablet 0   No current facility-administered medications on file prior to visit.     PAST MEDICAL HISTORY: Past Medical History:  Diagnosis Date   Anxiety    Asthma    History of chicken pox    IBS (irritable bowel syndrome)    Migraines     PAST SURGICAL HISTORY: Past Surgical History:  Procedure Laterality Date   tonsillectomy and adnoidectomy Bilateral 2007    SOCIAL HISTORY: Social History   Tobacco Use   Smoking status: Never Smoker   Smokeless tobacco: Never Used  Substance Use Topics   Alcohol use: Yes    Comment: socially   Drug use: No    FAMILY HISTORY: Family History  Problem Relation Age of Onset   Prostate cancer Father    Hypertension Father    Asthma Sister    Lung cancer Maternal Grandmother        heavy smoker   Lung cancer Maternal Grandfather        heavy smoker   Asthma Sister     ROS: Review of Systems  Constitutional: Negative for weight loss.  Endo/Heme/Allergies:       Negative for polyphagia    PHYSICAL EXAM: Pt in no acute distress  RECENT LABS AND TESTS: BMET    Component Value Date/Time   NA 142 06/08/2019 1011   K 5.0 06/08/2019 1011   CL 106 06/08/2019 1011   CO2 22 06/08/2019 1011   GLUCOSE 97 06/08/2019 1011   GLUCOSE 84 09/03/2017 0923   BUN 6 06/08/2019 1011   CREATININE 0.73 06/08/2019 1011    CALCIUM 9.7 06/08/2019 1011   GFRNONAA 111 06/08/2019 1011   GFRAA 128 06/08/2019 1011   Lab Results  Component Value Date   HGBA1C 5.5 06/08/2019   HGBA1C 5.5 12/03/2018   Lab Results  Component Value Date   INSULIN 33.7 (H) 06/08/2019   INSULIN 42.1 (H) 12/03/2018   CBC    Component Value Date/Time   WBC 9.8 12/03/2018 1002   WBC 11.7 (H) 09/03/2017 0923   RBC 4.78 12/03/2018 1002   RBC 4.71 09/03/2017 0923   HGB 13.8 12/03/2018 1002   HCT 42.9 12/03/2018 1002   PLT 445.0 (H) 09/03/2017 0923   MCV 90 12/03/2018 1002   MCH 28.9 12/03/2018 1002   MCHC 32.2 12/03/2018 1002   MCHC 32.2 09/03/2017 0923   RDW 13.1 12/03/2018 1002   LYMPHSABS 3.2 (H) 12/03/2018 1002   MONOABS 0.8 09/03/2017 [id number]  EOSABS 0.2 12/03/2018 1002   BASOSABS 0.0 12/03/2018 1002   Iron/TIBC/Ferritin/ %Sat No results found for: IRON, TIBC, FERRITIN, IRONPCTSAT Lipid Panel     Component Value Date/Time   CHOL 177 06/08/2019 1011   TRIG 331 (H) 06/08/2019 1011   HDL 32 (L) 06/08/2019 1011   LDLCALC 79 06/08/2019 1011   Hepatic Function Panel     Component Value Date/Time   PROT 6.6 06/08/2019 1011   ALBUMIN 4.9 06/08/2019 1011   AST 16 06/08/2019 1011   ALT 20 06/08/2019 1011   ALKPHOS 111 06/08/2019 1011   BILITOT 0.5 06/08/2019 1011      Component Value Date/Time   TSH 2.150 12/03/2018 1002   TSH 2.45 09/03/2017 0923     Ref. Range 06/08/2019 10:11  Vitamin D, 25-Hydroxy Latest Ref Range: 30.0 - 100.0 ng/mL 46.7    I, Nevada Crane, am acting as Energy manager for Quillian Quince, MD I have reviewed the above documentation for accuracy and completeness, and I agree with the above. -Quillian Quince, MD

## 2019-09-10 ENCOUNTER — Other Ambulatory Visit: Payer: Self-pay

## 2019-09-10 ENCOUNTER — Ambulatory Visit (INDEPENDENT_AMBULATORY_CARE_PROVIDER_SITE_OTHER): Payer: 59 | Admitting: Family Medicine

## 2019-09-10 ENCOUNTER — Encounter (INDEPENDENT_AMBULATORY_CARE_PROVIDER_SITE_OTHER): Payer: Self-pay | Admitting: Family Medicine

## 2019-09-10 VITALS — BP 111/76 | HR 109 | Temp 97.8°F | Ht 69.0 in | Wt 244.0 lb

## 2019-09-10 DIAGNOSIS — F418 Other specified anxiety disorders: Secondary | ICD-10-CM

## 2019-09-10 DIAGNOSIS — Z9189 Other specified personal risk factors, not elsewhere classified: Secondary | ICD-10-CM

## 2019-09-10 DIAGNOSIS — E8881 Metabolic syndrome: Secondary | ICD-10-CM

## 2019-09-10 DIAGNOSIS — R0602 Shortness of breath: Secondary | ICD-10-CM | POA: Diagnosis not present

## 2019-09-10 DIAGNOSIS — Z6836 Body mass index (BMI) 36.0-36.9, adult: Secondary | ICD-10-CM

## 2019-09-10 MED ORDER — ESCITALOPRAM OXALATE 10 MG PO TABS: 10.0000 mg | ORAL_TABLET | ORAL | 0 refills | Status: DC

## 2019-09-10 MED ORDER — METFORMIN HCL 500 MG PO TABS: 500.0000 mg | ORAL_TABLET | Freq: Two times a day (BID) | ORAL | 0 refills | Status: DC

## 2019-09-16 NOTE — Progress Notes (Signed)
Office: [phone]  /  Fax: [phone]   HPI:   Chief Complaint: OBESITY Miranda Hunter is here to discuss her progress with her obesity treatment plan. She is on the keep a food journal with 1500-1700 calories and 90+ grams of protein daily and is following her eating plan approximately 50 % of the time. She states she is exercising 60 minutes 7 times per week. [name] has been mindful of her food choices but hasn't been journaling strictly, especially on weekends. Her RMR was rechecked and shows her metabolism is still very good.  Her weight is 244 lb (110.7 kg) today and has gained 3 lbs since her last visit. She has lost 13 lbs since starting treatment with [place].  Insulin Resistance [name] has a diagnosis of insulin resistance based on her elevated fasting insulin level >5. Although [name]'s blood glucose readings are still under good control, insulin resistance puts her at greater risk of metabolic syndrome and diabetes. She started Rybelsus and had increase in migraine headaches with nausea. So she stopped and they have gotten better. She is not sure if this is related to the medication. She continues to work on diet and exercise to decrease risk of diabetes.  At risk for diabetes [name] is at higher than average risk for developing diabetes due to her obesity and insulin resistance. She currently denies polyuria or polydipsia.  Shortness of Breath with Exertion [name] notes no improvement in shortness of breath with weight loss, and she wonders if her RMR has decreased. She notes getting out of breath sooner with activity than she used to. [name] denies shortness of breath at rest or orthopnea.  Depression with Anxiety [name] struggles with depression and anxiety. She has been out of her Wellbutrin for 3-4 days. She feels it wasn't helping and may have contributed to her migraine headaches. Her anxiety has worsened in the last month. She shows no sign of suicidal or homicidal  ideations.  ASSESSMENT AND PLAN:  Insulin resistance - Plan: metFORMIN (GLUCOPHAGE) 500 MG tablet  Shortness of breath on exertion  Depression with anxiety - Plan: escitalopram (LEXAPRO) 10 MG tablet  At risk for diabetes mellitus  Class 2 severe obesity with serious comorbidity and body mass index (BMI) of 36.0 to 36.9 in adult, unspecified obesity type (HCC)  PLAN:  Insulin Resistance [name] will continue to work on weight loss, exercise, and decreasing simple carbohydrates in her diet to help decrease the risk of diabetes. We dicussed metformin including benefits and risks. She was informed that eating too many simple carbohydrates or too many calories at one sitting increases the likelihood of GI side effects. [name] agrees to continue taking metformin 500 mg PO BID #60 and we will refill for 1 month; she is to hold off on Rybelsus for now. [name] agrees to follow up with our clinic in 2 to 3 weeks as directed to monitor her progress.  Diabetes risk counseling [name] was given extended (15 minutes) diabetes prevention counseling today. She is 33 y.o. female and has risk factors for diabetes including obesity and insulin resistance. We discussed intensive lifestyle modifications today with an emphasis on weight loss as well as increasing exercise and decreasing simple carbohydrates in her diet.  Shortness of Breath with Exertion [name]'s shortness of breath appears to be obesity related and exercise induced. The indirect calorimeter was repeated and within normal limits, results showed VO2 of 383 and a REE of 2669. She has agreed to work on weight loss and gradually increase exercise  to treat her exercise induced shortness of breath. If [name] follows our instructions and loses weight without improvement of her shortness of breath, we will plan to refer to pulmonology. [name] agrees to this plan.  Depression with Anxiety We discussed behavior modification techniques today  to help [name] deal with her depression and anxiety. [name] agrees to discontinue Wellbutrin and start Lexapro 10 mg PO q daily #30 with no refills. Shonita agrees to follow up with our clinic in 2 to 3 weeks.  Obesity [name] is currently in the action stage of change. As such, her goal is to continue with weight loss efforts She has agreed to keep a food journal with 1500-1800 calories and 100+ grams of protein  [name] has been instructed to work up to a goal of 150 minutes of combined cardio and strengthening exercise per week for weight loss and overall health benefits. We discussed the following Behavioral Modification Strategies today: increasing lean protein intake, decreasing simple carbohydrates, and keeping healthy foods in the home    [name] has agreed to follow up with our clinic in 2 to 3 weeks. She was informed of the importance of frequent follow up visits to maximize her success with intensive lifestyle modifications for her multiple health conditions.  ALLERGIES: Allergies  Allergen Reactions  . Amoxicillin-Pot Clavulanate Diarrhea    MEDICATIONS: Current Outpatient Medications on File Prior to Visit  Medication Sig Dispense Refill  . albuterol (PROVENTIL HFA;VENTOLIN HFA) 108 (90 Base) MCG/ACT inhaler Inhale 2 puffs into the lungs every 6 (six) hours as needed for wheezing or shortness of breath. 1 Inhaler 2  . cetirizine (ZYRTEC) 10 MG tablet Take 10 mg by mouth daily.    . Flaxseed, Linseed, (FLAX SEEDS PO) Take 1 tablet by mouth at bedtime.    Marland Kitchen ketorolac (TORADOL) 10 MG tablet Take 1 tablet (10 mg total) by mouth every 6 (six) hours as needed. 20 tablet 11  . Multiple Vitamin (MULTIVITAMIN) tablet Take 1 tablet by mouth daily.    . norethindrone (MICRONOR,CAMILA,ERRIN) 0.35 MG tablet Take 1 tablet by mouth daily.    . ondansetron (ZOFRAN-ODT) 4 MG disintegrating tablet Take 1 tablet (4 mg total) by mouth every 8 (eight) hours as needed for nausea. 30 tablet  3  . rizatriptan (MAXALT-MLT) 10 MG disintegrating tablet Take 1 tablet (10 mg total) by mouth as needed for migraine. May repeat in 2 hours if needed 9 tablet 11  . Semaglutide (RYBELSUS) 3 MG TABS Take 3 mg by mouth daily. 30 tablet 0   No current facility-administered medications on file prior to visit.     PAST MEDICAL HISTORY: Past Medical History:  Diagnosis Date  . Anxiety   . Asthma   . History of chicken pox   . IBS (irritable bowel syndrome)   . Migraines     PAST SURGICAL HISTORY: Past Surgical History:  Procedure Laterality Date  . tonsillectomy and adnoidectomy Bilateral 2007    SOCIAL HISTORY: Social History   Tobacco Use  . Smoking status: Never Smoker  . Smokeless tobacco: Never Used  Substance Use Topics  . Alcohol use: Yes    Comment: socially  . Drug use: No    FAMILY HISTORY: Family History  Problem Relation Age of Onset  . Prostate cancer Father   . Hypertension Father   . Asthma Sister   . Lung cancer Maternal Grandmother        heavy smoker  . Lung cancer Maternal Grandfather  heavy smoker  . Asthma Sister     ROS: Review of Systems  Constitutional: Negative for weight loss.  Respiratory: Positive for shortness of breath (with exertion).   Cardiovascular: Negative for orthopnea.  Genitourinary: Negative for frequency.  Endo/Heme/Allergies: Negative for polydipsia.  Psychiatric/Behavioral: Positive for depression. Negative for suicidal ideas.       + Anxiety    PHYSICAL EXAM: Blood pressure 111/76, pulse (!) 109, temperature 97.8 F (36.6 C), temperature source Oral, height 5\' 9"  (1.753 m), weight 244 lb (110.7 kg), SpO2 95 %. Body mass index is 36.03 kg/m. Physical Exam Vitals signs reviewed.  Constitutional:      Appearance: Normal appearance. She is obese.  Cardiovascular:     Rate and Rhythm: Normal rate.     Pulses: Normal pulses.  Pulmonary:     Effort: Pulmonary effort is normal.     Breath sounds: Normal  breath sounds.  Musculoskeletal: Normal range of motion.  Skin:    General: Skin is warm and dry.  Neurological:     Mental Status: She is alert and oriented to person, place, and time.  Psychiatric:        Mood and Affect: Mood normal.        Behavior: Behavior normal.     RECENT LABS AND TESTS: BMET    Component Value Date/Time   NA 142 06/08/2019 1011   K 5.0 06/08/2019 1011   CL 106 06/08/2019 1011   CO2 22 06/08/2019 1011   GLUCOSE 97 06/08/2019 1011   GLUCOSE 84 09/03/2017 0923   BUN 6 06/08/2019 1011   CREATININE 0.73 06/08/2019 1011   CALCIUM 9.7 06/08/2019 1011   GFRNONAA 111 06/08/2019 1011   GFRAA 128 06/08/2019 1011   Lab Results  Component Value Date   HGBA1C 5.5 06/08/2019   HGBA1C 5.5 12/03/2018   Lab Results  Component Value Date   INSULIN 33.7 (H) 06/08/2019   INSULIN 42.1 (H) 12/03/2018   CBC    Component Value Date/Time   WBC 9.8 12/03/2018 1002   WBC 11.7 (H) 09/03/2017 0923   RBC 4.78 12/03/2018 1002   RBC 4.71 09/03/2017 0923   HGB 13.8 12/03/2018 1002   HCT 42.9 12/03/2018 1002   PLT 445.0 (H) 09/03/2017 0923   MCV 90 12/03/2018 1002   MCH 28.9 12/03/2018 1002   MCHC 32.2 12/03/2018 1002   MCHC 32.2 09/03/2017 0923   RDW 13.1 12/03/2018 1002   LYMPHSABS 3.2 (H) 12/03/2018 1002   MONOABS 0.8 09/03/2017 0923   EOSABS 0.2 12/03/2018 1002   BASOSABS 0.0 12/03/2018 1002   Iron/TIBC/Ferritin/ %Sat No results found for: IRON, TIBC, FERRITIN, IRONPCTSAT Lipid Panel     Component Value Date/Time   CHOL 177 06/08/2019 1011   TRIG 331 (H) 06/08/2019 1011   HDL 32 (L) 06/08/2019 1011   LDLCALC 79 06/08/2019 1011   Hepatic Function Panel     Component Value Date/Time   PROT 6.6 06/08/2019 1011   ALBUMIN 4.9 06/08/2019 1011   AST 16 06/08/2019 1011   ALT 20 06/08/2019 1011   ALKPHOS 111 06/08/2019 1011   BILITOT 0.5 06/08/2019 1011      Component Value Date/Time   TSH 2.150 12/03/2018 1002   TSH 2.45 09/03/2017 0923       OBESITY BEHAVIORAL INTERVENTION VISIT  Today's visit was # 17   Starting weight: 257 lbs Starting date: 12/03/2018 Today's weight : 244 lbs Today's date: 09/10/2019 Total lbs lost to date: 5913  ASK: We discussed the diagnosis of obesity with [name] today and Maansi agreed to give [place] permission to discuss obesity behavioral modification therapy today.  ASSESS: Jase has the diagnosis of obesity and her BMI today is 36.02 Kaijah is in the action stage of change   ADVISE: Eulanda was educated on the multiple health risks of obesity as well as the benefit of weight loss to improve her health. She was advised of the need for long term treatment and the importance of lifestyle modifications to improve her current health and to decrease her risk of future health problems.  AGREE: Multiple dietary modification options and treatment options were discussed and  Breeley agreed to follow the recommendations documented in the above note.  ARRANGE: Yarieliz was educated on the importance of frequent visits to treat obesity as outlined per CMS and USPSTF guidelines and agreed to schedule her next follow up appointment today.  I, Burt Knack, am acting as transcriptionist for Quillian Quince, MD I have reviewed the above documentation for accuracy and completeness, and I agree with the above. -Quillian Quince, MD

## 2019-09-19 ENCOUNTER — Other Ambulatory Visit (INDEPENDENT_AMBULATORY_CARE_PROVIDER_SITE_OTHER): Payer: Self-pay | Admitting: Family Medicine

## 2019-09-19 DIAGNOSIS — E8881 Metabolic syndrome: Secondary | ICD-10-CM

## 2019-09-28 ENCOUNTER — Encounter: Payer: Self-pay | Admitting: Physician Assistant

## 2019-09-29 ENCOUNTER — Encounter (INDEPENDENT_AMBULATORY_CARE_PROVIDER_SITE_OTHER): Payer: Self-pay | Admitting: Family Medicine

## 2019-09-29 ENCOUNTER — Other Ambulatory Visit: Payer: Self-pay

## 2019-09-29 ENCOUNTER — Ambulatory Visit (INDEPENDENT_AMBULATORY_CARE_PROVIDER_SITE_OTHER): Payer: 59 | Admitting: Family Medicine

## 2019-09-29 VITALS — BP 117/76 | HR 85 | Temp 98.4°F | Ht 69.0 in | Wt 240.0 lb

## 2019-09-29 DIAGNOSIS — E8881 Metabolic syndrome: Secondary | ICD-10-CM | POA: Diagnosis not present

## 2019-09-29 DIAGNOSIS — Z6835 Body mass index (BMI) 35.0-35.9, adult: Secondary | ICD-10-CM | POA: Diagnosis not present

## 2019-09-29 DIAGNOSIS — F3289 Other specified depressive episodes: Secondary | ICD-10-CM | POA: Diagnosis not present

## 2019-09-30 NOTE — Progress Notes (Signed)
Office: [phone]  /  Fax: [phone]   HPI:   Chief Complaint: OBESITY Miranda Hunter is here to discuss her progress with her obesity treatment plan. She is on the keep a food journal with 1500 to 1800 calories and 85 to 90 grams of protein daily plan and is following her eating plan approximately 70 % of the time. She states she is exercising 45 minutes 5 to 7 times per week. Miranda Hunter is journaling consistently, but she sometimes tends to go over in calories when she is very hungry. She tends to get 85 to 90 grams of protein per day. Her weight is 240 lb (108.9 kg) today and has had a weight loss of 4 pounds over a period of 2 to 3 weeks since her last visit. She has lost 17 lbs since starting treatment with [place].  Insulin Resistance Vicy has a diagnosis of insulin resistance based on her elevated fasting insulin level >5. Although Miranda Hunter's blood glucose readings are still under good control, insulin resistance puts her at greater risk of metabolic syndrome and diabetes. She is on metformin, but she is not taking it twice daily as prescribed. She felt Rybelsus caused migraines and discontinued it, but she would like to try it again. She admits to polyphagia at lunch. Miranda Hunter continues to work on diet and exercise to decrease risk of diabetes.  Depression with Anxiety Miranda Hunter feels the Lexapro that was started at the last visit, has helped quite a bit with her mood. She denies side effects. Her mood is stable and she shows no sign of suicidal or homicidal ideations.  ASSESSMENT AND PLAN:  Insulin resistance  Other depression - with emotional eating  Class 2 severe obesity with serious comorbidity and body mass index (BMI) of 35.0 to 35.9 in adult, unspecified obesity type (H[facility])  PLAN:  Insulin Resistance Miranda Hunter will continue to work on weight loss, exercise, and decreasing simple carbohydrates in her diet to help decrease the risk of diabetes. Miranda Hunter agrees to take  metformin two times daily as directed and start Rybelsus again. She agreed to follow up with [place] as directed to monitor her progress.  Depression with Emotional Eating Behaviors We discussed behavior modification techniques today to help Miranda Hunter deal with her emotional eating and depression. Miranda Hunter will continue to take Lexapro 10 mg daily and she will follow up as directed.  Obesity Miranda Hunter is currently in the action stage of change. As such, her goal is to continue with weight loss efforts She has agreed to keep a food journal with 1500 to 1800 calories and 100 grams of protein daily Miranda Hunter will continue walking 45 minutes, 5 to 7 times per week for weight loss and overall health benefits. We discussed the following Behavioral Modification Strategies today: planning for success and better snacking choices  Foy has agreed to follow up with our clinic in 3 weeks. She was informed of the importance of frequent follow up visits to maximize her success with intensive lifestyle modifications for her multiple health conditions.  ALLERGIES: Allergies  Allergen Reactions   Amoxicillin-Pot Clavulanate Diarrhea    MEDICATIONS: Current Outpatient Medications on File Prior to Visit  Medication Sig Dispense Refill   albuterol (PROVENTIL HFA;VENTOLIN HFA) 108 (90 Base) MCG/ACT inhaler Inhale 2 puffs into the lungs every 6 (six) hours as needed for wheezing or shortness of breath. 1 Inhaler 2   cetirizine (ZYRTEC) 10 MG tablet Take 10 mg by mouth daily.     escitalopram (LEXAPRO) 10 MG tablet  Take 1 tablet (10 mg total) by mouth every morning. 30 tablet 0   Flaxseed, Linseed, (FLAX SEEDS PO) Take 1 tablet by mouth at bedtime.     ketorolac (TORADOL) 10 MG tablet Take 1 tablet (10 mg total) by mouth every 6 (six) hours as needed. 20 tablet 11   metFORMIN (GLUCOPHAGE) 500 MG tablet Take 1 tablet (500 mg total) by mouth 2 (two) times daily with a meal. 60 tablet 0   Multiple Vitamin  (MULTIVITAMIN) tablet Take 1 tablet by mouth daily.     norethindrone (MICRONOR,CAMILA,ERRIN) 0.35 MG tablet Take 1 tablet by mouth daily.     ondansetron (ZOFRAN-ODT) 4 MG disintegrating tablet Take 1 tablet (4 mg total) by mouth every 8 (eight) hours as needed for nausea. 30 tablet 3   rizatriptan (MAXALT-MLT) 10 MG disintegrating tablet Take 1 tablet (10 mg total) by mouth as needed for migraine. May repeat in 2 hours if needed 9 tablet 11   Semaglutide (RYBELSUS) 3 MG TABS Take 3 mg by mouth daily. 30 tablet 0   No current facility-administered medications on file prior to visit.     PAST MEDICAL HISTORY: Past Medical History:  Diagnosis Date   Anxiety    Asthma    History of chicken pox    IBS (irritable bowel syndrome)    Migraines     PAST SURGICAL HISTORY: Past Surgical History:  Procedure Laterality Date   tonsillectomy and adnoidectomy Bilateral 2007    SOCIAL HISTORY: Social History   Tobacco Use   Smoking status: Never Smoker   Smokeless tobacco: Never Used  Substance Use Topics   Alcohol use: Yes    Comment: socially   Drug use: No    FAMILY HISTORY: Family History  Problem Relation Age of Onset   Prostate cancer Father    Hypertension Father    Asthma Sister    Lung cancer Maternal Grandmother        heavy smoker   Lung cancer Maternal Grandfather        heavy smoker   Asthma Sister     ROS: Review of Systems  Constitutional: Positive for weight loss.  Endo/Heme/Allergies:       Positive for polyphagia  Psychiatric/Behavioral: Positive for depression. Negative for suicidal ideas. The patient is nervous/anxious.     PHYSICAL EXAM: Blood pressure 117/76, pulse 85, temperature 98.4 F (36.9 C), temperature source Oral, height 5\' 9"  (1.753 m), weight 240 lb (108.9 kg), SpO2 97 %. Body mass index is 35.44 kg/m. Physical Exam Vitals signs reviewed.  Constitutional:      Appearance: Normal appearance. She is well-developed.  She is obese.  Cardiovascular:     Rate and Rhythm: Normal rate.  Pulmonary:     Effort: Pulmonary effort is normal.  Musculoskeletal: Normal range of motion.  Skin:    General: Skin is warm and dry.  Neurological:     Mental Status: She is alert and oriented to person, place, and time.  Psychiatric:        Mood and Affect: Mood normal.        Behavior: Behavior normal.     RECENT LABS AND TESTS: BMET    Component Value Date/Time   NA 142 06/08/2019 1011   K 5.0 06/08/2019 1011   CL 106 06/08/2019 1011   CO2 22 06/08/2019 1011   GLUCOSE 97 06/08/2019 1011   GLUCOSE 84 09/03/2017 0923   BUN 6 06/08/2019 1011   CREATININE 0.73 06/08/2019 1011  CALCIUM 9.7 06/08/2019 1011   GFRNONAA 111 06/08/2019 1011   GFRAA 128 06/08/2019 1011   Lab Results  Component Value Date   HGBA1C 5.5 06/08/2019   HGBA1C 5.5 12/03/2018   Lab Results  Component Value Date   INSULIN 33.7 (H) 06/08/2019   INSULIN 42.1 (H) 12/03/2018   CBC    Component Value Date/Time   WBC 9.8 12/03/2018 1002   WBC 11.7 (H) 09/03/2017 0923   RBC 4.78 12/03/2018 1002   RBC 4.71 09/03/2017 0923   HGB 13.8 12/03/2018 1002   HCT 42.9 12/03/2018 1002   PLT 445.0 (H) 09/03/2017 0923   MCV 90 12/03/2018 1002   MCH 28.9 12/03/2018 1002   MCHC 32.2 12/03/2018 1002   MCHC 32.2 09/03/2017 0923   RDW 13.1 12/03/2018 1002   LYMPHSABS 3.2 (H) 12/03/2018 1002   MONOABS 0.8 09/03/2017 0923   EOSABS 0.2 12/03/2018 1002   BASOSABS 0.0 12/03/2018 1002   Iron/TIBC/Ferritin/ %Sat No results found for: IRON, TIBC, FERRITIN, IRONPCTSAT Lipid Panel     Component Value Date/Time   CHOL 177 06/08/2019 1011   TRIG 331 (H) 06/08/2019 1011   HDL 32 (L) 06/08/2019 1011   LDLCALC 79 06/08/2019 1011   Hepatic Function Panel     Component Value Date/Time   PROT 6.6 06/08/2019 1011   ALBUMIN 4.9 06/08/2019 1011   AST 16 06/08/2019 1011   ALT 20 06/08/2019 1011   ALKPHOS 111 06/08/2019 1011   BILITOT 0.5 06/08/2019  1011      Component Value Date/Time   TSH 2.150 12/03/2018 1002   TSH 2.45 09/03/2017 0923     Ref. Range 06/08/2019 10:11  Vitamin D, 25-Hydroxy Latest Ref Range: 30.0 - 100.0 ng/mL 46.7    OBESITY BEHAVIORAL INTERVENTION VISIT  Today's visit was # 18   Starting weight: 257 lbs Starting date: 12/03/2018 Today's weight : 240 lbs Today's date: 09/29/2019 Total lbs lost to date: 17    09/29/2019  Height 5\' 9"  (1.753 m)  Weight 240 lb (108.9 kg)  BMI (Calculated) 35.43  BLOOD PRESSURE - SYSTOLIC 063  BLOOD PRESSURE - DIASTOLIC 76   Body Fat % 01.6 %  Total Body Water (lbs) 88.2 lbs    ASK: We discussed the diagnosis of obesity with Miranda Hunter today and Miranda Hunter agreed to give [place] permission to discuss obesity behavioral modification therapy today.  ASSESS: Miranda Hunter has the diagnosis of obesity and her BMI today is 35.43 Miranda Hunter is in the action stage of change   ADVISE: Khristy was educated on the multiple health risks of obesity as well as the benefit of weight loss to improve her health. She was advised of the need for long term treatment and the importance of lifestyle modifications to improve her current health and to decrease her risk of future health problems.  AGREE: Multiple dietary modification options and treatment options were discussed and  Aaliyha agreed to follow the recommendations documented in the above note.  ARRANGE: Delphine was educated on the importance of frequent visits to treat obesity as outlined per CMS and USPSTF guidelines and agreed to schedule her next follow up appointment today.  I, Doreene Nest, am acting as transcriptionist for [employer], FNP-C  I have reviewed the above documentation for accuracy and completeness, and I agree with the above.  - Jaileen Janelle, FNP-C.

## 2019-10-01 ENCOUNTER — Encounter (INDEPENDENT_AMBULATORY_CARE_PROVIDER_SITE_OTHER): Payer: Self-pay | Admitting: Family Medicine

## 2019-10-01 DIAGNOSIS — E88819 Insulin resistance, unspecified: Secondary | ICD-10-CM | POA: Insufficient documentation

## 2019-10-01 DIAGNOSIS — E8881 Metabolic syndrome: Secondary | ICD-10-CM | POA: Insufficient documentation

## 2019-10-04 ENCOUNTER — Other Ambulatory Visit (INDEPENDENT_AMBULATORY_CARE_PROVIDER_SITE_OTHER): Payer: Self-pay | Admitting: Family Medicine

## 2019-10-04 DIAGNOSIS — F418 Other specified anxiety disorders: Secondary | ICD-10-CM

## 2019-10-07 ENCOUNTER — Other Ambulatory Visit (INDEPENDENT_AMBULATORY_CARE_PROVIDER_SITE_OTHER): Payer: Self-pay | Admitting: Family Medicine

## 2019-10-07 DIAGNOSIS — E8881 Metabolic syndrome: Secondary | ICD-10-CM

## 2019-10-26 ENCOUNTER — Encounter (INDEPENDENT_AMBULATORY_CARE_PROVIDER_SITE_OTHER): Payer: Self-pay | Admitting: Family Medicine

## 2019-10-26 ENCOUNTER — Ambulatory Visit (INDEPENDENT_AMBULATORY_CARE_PROVIDER_SITE_OTHER): Payer: 59 | Admitting: Family Medicine

## 2019-10-26 ENCOUNTER — Other Ambulatory Visit: Payer: Self-pay

## 2019-10-26 VITALS — BP 110/77 | HR 106 | Temp 97.8°F | Ht 69.0 in | Wt 239.0 lb

## 2019-10-26 DIAGNOSIS — Z6835 Body mass index (BMI) 35.0-35.9, adult: Secondary | ICD-10-CM

## 2019-10-26 DIAGNOSIS — Z9189 Other specified personal risk factors, not elsewhere classified: Secondary | ICD-10-CM | POA: Diagnosis not present

## 2019-10-26 DIAGNOSIS — F418 Other specified anxiety disorders: Secondary | ICD-10-CM | POA: Diagnosis not present

## 2019-10-26 DIAGNOSIS — E559 Vitamin D deficiency, unspecified: Secondary | ICD-10-CM

## 2019-10-26 DIAGNOSIS — E8881 Metabolic syndrome: Secondary | ICD-10-CM | POA: Diagnosis not present

## 2019-10-26 MED ORDER — METFORMIN HCL 500 MG PO TABS: 500.0000 mg | ORAL_TABLET | Freq: Two times a day (BID) | ORAL | 0 refills | Status: DC

## 2019-10-26 MED ORDER — ESCITALOPRAM OXALATE 10 MG PO TABS: ORAL_TABLET | ORAL | 0 refills | Status: DC

## 2019-10-26 NOTE — Progress Notes (Signed)
Office: [phone]  /  Fax: [phone]   HPI:   Chief Complaint: OBESITY Miranda Hunter is here to discuss her progress with her obesity treatment plan. She is keeping a food journal with 1500-1800 calories and 100 grams of protein and is following her eating plan approximately 80% of the time. She states she is walking 60 minutes 7 times per week. Miranda Hunter did well with using her Thanksgiving strategies. She feels her hunger is controlled and she has less temptations around her right now. She is doing well with her journaling.  Her weight is 239 lb (108.4 kg) today and has had a weight loss of 1 pound over a period of 4 weeks since her last visit. She has lost 18 lbs since starting treatment with [place].  Insulin Resistance [name] has a diagnosis of insulin resistance based on her elevated fasting insulin level >5. Although Miranda Hunter's blood glucose readings are still under good control, insulin resistance puts her at greater risk of metabolic syndrome and diabetes. She is stable on metformin currently and continues to work on diet and exercise to decrease risk of diabetes. No nausea, vomiting, or hypoglycemia. She is due to have labs rechecked.  Depression with Anxiety Miranda Hunter is struggling with emotional eating and using food for comfort to the extent that it is negatively impacting her health. She often snacks when she is not hungry. Miranda Hunter sometimes feels she is out of control and then feels guilty that she made poor food choices. She has been working on behavior modification techniques to help reduce her emotional eating and has been somewhat successful. Miranda Hunter started Lexapro, which she was hesitant to do, but feels it has helped her anxiety and emotional eating. Mood is improved and she shows no sign of suicidal or homicidal ideations.  Depression screen [facility] 2/9 12/03/2018 09/03/2017  Decreased Interest 2 0  Down, Depressed, Hopeless 3 0  PHQ - 2 Score 5 0  Altered sleeping 1 -  Tired,  decreased energy 3 -  Change in appetite 2 -  Feeling bad or failure about yourself  1 -  Trouble concentrating 1 -  Moving slowly or fidgety/restless 0 -  Suicidal thoughts 0 -  PHQ-9 Score 13 -  Difficult doing work/chores Somewhat difficult -   Vitamin D deficiency Miranda Hunter has a diagnosis of Vitamin D deficiency, which is almost at goal. She is currently stable on Vit D and denies nausea, vomiting or muscle weakness. Miranda Hunter is due for labs.  At risk for osteopenia and osteoporosis Miranda Hunter is at higher risk of osteopenia and osteoporosis due to Vitamin D deficiency.   ASSESSMENT AND PLAN:  Insulin resistance - Plan: Comprehensive Metabolic Panel (CMET), HgB A1c, Insulin, random, Lipid Panel With LDL/HDL Ratio, metFORMIN (GLUCOPHAGE) 500 MG tablet  Vitamin D deficiency - Plan: Vitamin D (25 hydroxy)  Depression with anxiety - Plan: escitalopram (LEXAPRO) 10 MG tablet  At risk for osteoporosis  Class 2 severe obesity with serious comorbidity and body mass index (BMI) of 35.0 to 35.9 in adult, unspecified obesity type ([place])  PLAN:  Insulin Resistance Miranda Hunter will continue to work on weight loss, exercise, and decreasing simple carbohydrates in her diet to help decrease the risk of diabetes. We dicussed metformin including benefits and risks. She was informed that eating too many simple carbohydrates or too many calories at one sitting increases the likelihood of GI side effects. Miranda Hunter was given a refill on her metformin and she will have labs checked. She agrees to follow-up with our  clinic in 2-3 weeks.  Depression with Anxiety We discussed behavior modification techniques today to help [name] deal with her emotional eating and depression. [name] was given a refill on her Lexapro and agrees to follow-up with our clinic in 2-3 weeks.  Vitamin D Deficiency Miranda Hunter was informed that low Vitamin D levels contributes to fatigue and are associated with obesity, breast, and  colon cancer. She agrees to have routine testing of Vitamin D and follow-up with our clinic in 2-3 weeks.  At risk for osteopenia and osteoporosis Miranda Hunter was given extended  (15 minutes) osteoporosis prevention counseling today. Miranda Hunter is at risk for osteopenia and osteoporosis due to her Vitamin D deficiency. She was encouraged to take her Vitamin D and follow her higher calcium diet and increase strengthening exercise to help strengthen her bones and decrease her risk of osteopenia and osteoporosis.  Obesity Miranda Hunter is currently in the action stage of change. As such, her goal is to continue with weight loss efforts. She has agreed to keep a food journal with 1500-1800 calories and 100 grams of protein. Miranda Hunter has been instructed to work up to a goal of 150 minutes of combined cardio and strengthening exercise per week for weight loss and overall health benefits. We discussed the following Behavioral Modification Strategies today: holiday eating strategies and keep a strict food journal.  [name] has agreed to follow-up with our clinic in 2-3 weeks. She was informed of the importance of frequent follow-up visits to maximize her success with intensive lifestyle modifications for her multiple health conditions.  ALLERGIES: Allergies  Allergen Reactions   Amoxicillin-Pot Clavulanate Diarrhea    MEDICATIONS: Current Outpatient Medications on File Prior to Visit  Medication Sig Dispense Refill   albuterol (PROVENTIL HFA;VENTOLIN HFA) 108 (90 Base) MCG/ACT inhaler Inhale 2 puffs into the lungs every 6 (six) hours as needed for wheezing or shortness of breath. 1 Inhaler 2   cetirizine (ZYRTEC) 10 MG tablet Take 10 mg by mouth daily.     Flaxseed, Linseed, (FLAX SEEDS PO) Take 1 tablet by mouth at bedtime.     ketorolac (TORADOL) 10 MG tablet Take 1 tablet (10 mg total) by mouth every 6 (six) hours as needed. 20 tablet 11   Multiple Vitamin (MULTIVITAMIN) tablet Take 1 tablet by  mouth daily.     norethindrone (MICRONOR,CAMILA,ERRIN) 0.35 MG tablet Take 1 tablet by mouth daily.     ondansetron (ZOFRAN-ODT) 4 MG disintegrating tablet Take 1 tablet (4 mg total) by mouth every 8 (eight) hours as needed for nausea. 30 tablet 3   rizatriptan (MAXALT-MLT) 10 MG disintegrating tablet Take 1 tablet (10 mg total) by mouth as needed for migraine. May repeat in 2 hours if needed 9 tablet 11   No current facility-administered medications on file prior to visit.     PAST MEDICAL HISTORY: Past Medical History:  Diagnosis Date   Anxiety    Asthma    History of chicken pox    IBS (irritable bowel syndrome)    Migraines     PAST SURGICAL HISTORY: Past Surgical History:  Procedure Laterality Date   tonsillectomy and adnoidectomy Bilateral 2007    SOCIAL HISTORY: Social History   Tobacco Use   Smoking status: Never Smoker   Smokeless tobacco: Never Used  Substance Use Topics   Alcohol use: Yes    Comment: socially   Drug use: No    FAMILY HISTORY: Family History  Problem Relation Age of Onset   Prostate cancer Father  Hypertension Father    Asthma Sister    Lung cancer Maternal Grandmother        heavy smoker   Lung cancer Maternal Grandfather        heavy smoker   Asthma Sister    ROS: Review of Systems  Gastrointestinal: Negative for nausea and vomiting.  Musculoskeletal:       Negative for muscle weakness.  Endo/Heme/Allergies:       Negative for hypoglycemia.  Psychiatric/Behavioral: Positive for depression. Negative for suicidal ideas. The patient is nervous/anxious.        Negative for homicidal ideas.   PHYSICAL EXAM: Blood pressure 110/77, pulse (!) 106, temperature 97.8 F (36.6 C), temperature source Oral, height 5\' 9"  (1.753 m), weight 239 lb (108.4 kg), last menstrual period 10/22/2019, SpO2 94 %. Body mass index is 35.29 kg/m. Physical Exam Vitals signs reviewed.  Constitutional:      Appearance: Normal  appearance. She is obese.  Cardiovascular:     Rate and Rhythm: Normal rate.     Pulses: Normal pulses.  Pulmonary:     Effort: Pulmonary effort is normal.     Breath sounds: Normal breath sounds.  Musculoskeletal: Normal range of motion.  Skin:    General: Skin is warm and dry.  Neurological:     Mental Status: She is alert and oriented to person, place, and time.  Psychiatric:        Behavior: Behavior normal.   RECENT LABS AND TESTS: BMET    Component Value Date/Time   NA 142 06/08/2019 1011   K 5.0 06/08/2019 1011   CL 106 06/08/2019 1011   CO2 22 06/08/2019 1011   GLUCOSE 97 06/08/2019 1011   GLUCOSE 84 09/03/2017 0923   BUN 6 06/08/2019 1011   CREATININE 0.73 06/08/2019 1011   CALCIUM 9.7 06/08/2019 1011   GFRNONAA 111 06/08/2019 1011   GFRAA 128 06/08/2019 1011   Lab Results  Component Value Date   HGBA1C 5.5 06/08/2019   HGBA1C 5.5 12/03/2018   Lab Results  Component Value Date   INSULIN 33.7 (H) 06/08/2019   INSULIN 42.1 (H) 12/03/2018   CBC    Component Value Date/Time   WBC 9.8 12/03/2018 1002   WBC 11.7 (H) 09/03/2017 0923   RBC 4.78 12/03/2018 1002   RBC 4.71 09/03/2017 0923   HGB 13.8 12/03/2018 1002   HCT 42.9 12/03/2018 1002   PLT 445.0 (H) 09/03/2017 0923   MCV 90 12/03/2018 1002   MCH 28.9 12/03/2018 1002   MCHC 32.2 12/03/2018 1002   MCHC 32.2 09/03/2017 0923   RDW 13.1 12/03/2018 1002   LYMPHSABS 3.2 (H) 12/03/2018 1002   MONOABS 0.8 09/03/2017 0923   EOSABS 0.2 12/03/2018 1002   BASOSABS 0.0 12/03/2018 1002   Iron/TIBC/Ferritin/ %Sat No results found for: IRON, TIBC, FERRITIN, IRONPCTSAT Lipid Panel     Component Value Date/Time   CHOL 177 06/08/2019 1011   TRIG 331 (H) 06/08/2019 1011   HDL 32 (L) 06/08/2019 1011   LDLCALC 79 06/08/2019 1011   Hepatic Function Panel     Component Value Date/Time   PROT 6.6 06/08/2019 1011   ALBUMIN 4.9 06/08/2019 1011   AST 16 06/08/2019 1011   ALT 20 06/08/2019 1011   ALKPHOS 111  06/08/2019 1011   BILITOT 0.5 06/08/2019 1011      Component Value Date/Time   TSH 2.150 12/03/2018 1002   TSH 2.45 09/03/2017 0923   Results for CORTNEY, BEISSEL (MRN [name]) as of 10/26/2019  09:49  Ref. Range 06/08/2019 10:11  Vitamin D, 25-Hydroxy Latest Ref Range: 30.0 - 100.0 ng/mL 46.7   OBESITY BEHAVIORAL INTERVENTION VISIT  Today's visit was #19  Starting weight: 257 lbs Starting date: 12/03/2018 Today's weight: 239 lbs  Today's date: 10/26/2019 Total lbs lost to date: 18     10/26/2019  Height 5\' 9"  (1.753 m)  Weight 239 lb (108.4 kg)  BMI (Calculated) 35.28  BLOOD PRESSURE - SYSTOLIC 110  BLOOD PRESSURE - DIASTOLIC 77   Body Fat % 41.2 %  Total Body Water (lbs) 87.4 lbs   ASK: We discussed the diagnosis of obesity with [name] Ontiveros today and [name] agreed to give us permission to discuss obesity behavioral modification therapy today.  ASSESS: [name] has the diagnosis of obesity and her BMI today is 35.4. [name] is in the action stage of change.   ADVISE: [name] was educated on the multiple health risks of obesity as well as the benefit of weight loss to improve her health. She was advised of the need for long term treatment and the importance of lifestyle modifications to improve her current health and to decrease her risk of future health problems.  AGREE: Multiple dietary modification options and treatment options were discussed and  Kamora agreed to follow the recommendations documented in the above note.  ARRANGE: [name] was educated on the importance of frequent visits to treat obesity as outlined per CMS and USPSTF guidelines and agreed to schedule her next follow up appointment today.  I, Marianna Paymentenise Haag, am acting as [employer] managertranscriptionist for Quillian Quincearen Milta Croson, MD  I have reviewed the above documentation for accuracy and completeness, and I agree with the above. -Quillian Quincearen Reilynn Lauro, MD

## 2019-10-27 LAB — COMPREHENSIVE METABOLIC PANEL
ALT: 17 IU/L (ref 0–32)
AST: 19 IU/L (ref 0–40)
Albumin/Globulin Ratio: 2 (ref 1.2–2.2)
Albumin: 4.5 g/dL (ref 3.8–4.8)
Alkaline Phosphatase: 113 IU/L (ref 39–117)
BUN/Creatinine Ratio: 13 (ref 9–23)
BUN: 9 mg/dL (ref 6–20)
Bilirubin Total: <0.2 mg/dL (ref 0.0–1.2)
CO2: 20 mmol/L (ref 20–29)
Calcium: 9.8 mg/dL (ref 8.7–10.2)
Chloride: 106 mmol/L (ref 96–106)
Creatinine, Ser: 0.68 mg/dL (ref 0.57–1.00)
GFR calc Af Amer: 135 mL/min/{1.73_m2} (ref >59)
GFR calc non Af Amer: 117 mL/min/{1.73_m2} (ref >59)
Globulin, Total: 2.2 g/dL (ref 1.5–4.5)
Glucose: 99 mg/dL (ref 65–99)
Potassium: 4.7 mmol/L (ref 3.5–5.2)
Sodium: 141 mmol/L (ref 134–144)
Total Protein: 6.7 g/dL (ref 6.0–8.5)

## 2019-10-27 LAB — LIPID PANEL WITH LDL/HDL RATIO
Cholesterol, Total: 151 mg/dL (ref 100–199)
HDL: 35 mg/dL — ABNORMAL LOW (ref >39)
LDL Chol Calc (NIH): 75 mg/dL (ref 0–99)
LDL/HDL Ratio: 2.1 ratio (ref 0.0–3.2)
Triglycerides: 246 mg/dL — ABNORMAL HIGH (ref 0–149)
VLDL Cholesterol Cal: 41 mg/dL — ABNORMAL HIGH (ref 5–40)

## 2019-10-27 LAB — HEMOGLOBIN A1C
Est. average glucose Bld gHb Est-mCnc: 108 mg/dL
Hgb A1c MFr Bld: 5.4 % (ref 4.8–5.6)

## 2019-10-27 LAB — VITAMIN D 25 HYDROXY (VIT D DEFICIENCY, FRACTURES): Vit D, 25-Hydroxy: 41.4 ng/mL (ref 30.0–100.0)

## 2019-10-27 LAB — INSULIN, RANDOM: INSULIN: 36.1 u[IU]/mL — ABNORMAL HIGH (ref 2.6–24.9)

## 2019-11-10 ENCOUNTER — Telehealth: Payer: Self-pay | Admitting: Neurology

## 2019-11-10 MED ORDER — ONDANSETRON 4 MG PO TBDP: 4.0000 mg | ORAL_TABLET | Freq: Three times a day (TID) | ORAL | 0 refills | Status: DC | PRN

## 2019-11-10 NOTE — Telephone Encounter (Signed)
1) Medication(s) Requested (by name): ondansetron (ZOFRAN-ODT) 4 MG disintegrating tablet  2) Pharmacy of Choice:  [id number] - [place], [address]  [place], [place] [address]

## 2019-11-10 NOTE — Telephone Encounter (Signed)
Pending appt feb 2021. Refill x 1 given.

## 2019-11-16 ENCOUNTER — Other Ambulatory Visit: Payer: Self-pay

## 2019-11-16 ENCOUNTER — Telehealth (INDEPENDENT_AMBULATORY_CARE_PROVIDER_SITE_OTHER): Payer: 59 | Admitting: Bariatrics

## 2019-11-16 ENCOUNTER — Encounter (INDEPENDENT_AMBULATORY_CARE_PROVIDER_SITE_OTHER): Payer: Self-pay | Admitting: Bariatrics

## 2019-11-16 DIAGNOSIS — F418 Other specified anxiety disorders: Secondary | ICD-10-CM

## 2019-11-16 DIAGNOSIS — E8881 Metabolic syndrome: Secondary | ICD-10-CM

## 2019-11-16 DIAGNOSIS — E669 Obesity, unspecified: Secondary | ICD-10-CM | POA: Diagnosis not present

## 2019-11-16 MED ORDER — ESCITALOPRAM OXALATE 10 MG PO TABS: 10.0000 mg | ORAL_TABLET | Freq: Every day | ORAL | 0 refills | Status: DC

## 2019-11-16 NOTE — Progress Notes (Signed)
Office: [phone]  /  Fax: [phone] TeleHealth Visit:  Miranda Hunter has verbally consented to this TeleHealth visit today. The patient is located at home, the provider is located at the [facility] office. The participants in this visit include the listed provider and patient. The visit was conducted today via FaceTime.  HPI:  Chief Complaint: OBESITY Miranda Hunter is here to discuss her progress with her obesity treatment plan. She is keeping a food journal with 1900 calories and 90 grams of protein  and states she is following her eating plan approximately 70% of the time. She states she is walking 60 minutes 7 times per week.  Hiliary states that her weight remains the same.  Today's visit was #20 Starting weight: 257 lbs Starting date: 12/03/2018   Depression with Anxiety Miranda Hunter is taking  Lexapro; is not taking Toradol.  Insulin Resistance Miranda Hunter has a diagnosis of insulin resistance and is taking metformin.  ASSESSMENT AND PLAN:  Insulin resistance  Depression with anxiety - Plan: escitalopram (LEXAPRO) 10 MG tablet  Obesity (BMI 30-39.9)  PLAN:  Depression with Anxiety Miranda Hunter was given a prescription for Lexapro 10 mg 1 PO daily #30 with 0 refills. She agrees to follow-up with our clinic in 3 weeks.  Insulin Resistance Miranda Hunter will continue to work on weight loss, exercise, and decreasing simple carbohydrates to help decrease the risk of diabetes. Miranda Hunter will continue metformin. She will increase healthy fats and protein.  Obesity Miranda Hunter is currently in the action stage of change. As such, her goal is to continue with weight loss efforts. She has agreed to keep a food journal with 1900 calories and 90 grams of protein. Miranda Hunter will work on meal planning, intentional eating, increasing protein and raw vegetables, and will be more adherent to the plan.  Miranda Hunter has been instructed to walk 60 minutes 7 days per week for weight loss  and overall health benefits. We discussed the following Behavioral Modification Strategies today: increasing lean protein intake, decreasing simple carbohydrates, increasing vegetables, increase H20 intake, decrease eating out, no skipping meals, work on meal planning and easy cooking plans, keeping healthy foods in the home, and planning for success.  Miranda Hunter has agreed to follow-up with our clinic in 3 weeks. She was informed of the importance of frequent follow-up visits to maximize her success with intensive lifestyle modifications for her multiple health conditions.  ALLERGIES: Allergies  Allergen Reactions  . Amoxicillin-Pot Clavulanate Diarrhea    MEDICATIONS: Current Outpatient Medications on File Prior to Visit  Medication Sig Dispense Refill  . albuterol (PROVENTIL HFA;VENTOLIN HFA) 108 (90 Base) MCG/ACT inhaler Inhale 2 puffs into the lungs every 6 (six) hours as needed for wheezing or shortness of breath. 1 Inhaler 2  . cetirizine (ZYRTEC) 10 MG tablet Take 10 mg by mouth daily.    . Flaxseed, Linseed, (FLAX SEEDS PO) Take 1 tablet by mouth at bedtime.    Marland Kitchen ketorolac (TORADOL) 10 MG tablet Take 1 tablet (10 mg total) by mouth every 6 (six) hours as needed. 20 tablet 11  . metFORMIN (GLUCOPHAGE) 500 MG tablet Take 1 tablet (500 mg total) by mouth 2 (two) times daily with a meal. 60 tablet 0  . Multiple Vitamin (MULTIVITAMIN) tablet Take 1 tablet by mouth daily.    . norethindrone (MICRONOR,CAMILA,ERRIN) 0.35 MG tablet Take 1 tablet by mouth daily.    . ondansetron (ZOFRAN-ODT) 4 MG disintegrating tablet Take 1 tablet (4 mg total) by mouth every 8 (eight) hours as needed  for nausea. 30 tablet 0  . rizatriptan (MAXALT-MLT) 10 MG disintegrating tablet Take 1 tablet (10 mg total) by mouth as needed for migraine. May repeat in 2 hours if needed 9 tablet 11   No current facility-administered medications on file prior to visit.    PAST MEDICAL HISTORY: Past Medical History:    Diagnosis Date  . Anxiety   . Asthma   . History of chicken pox   . IBS (irritable bowel syndrome)   . Migraines     PAST SURGICAL HISTORY: Past Surgical History:  Procedure Laterality Date  . tonsillectomy and adnoidectomy Bilateral 2007    SOCIAL HISTORY: Social History   Tobacco Use  . Smoking status: Never Smoker  . Smokeless tobacco: Never Used  Substance Use Topics  . Alcohol use: Yes    Comment: socially  . Drug use: No    FAMILY HISTORY: Family History  Problem Relation Age of Onset  . Prostate cancer Father   . Hypertension Father   . Asthma Sister   . Lung cancer Maternal Grandmother        heavy smoker  . Lung cancer Maternal Grandfather        heavy smoker  . Asthma Sister    ROS: Review of Systems  Psychiatric/Behavioral: Positive for depression. The patient is nervous/anxious.    PHYSICAL EXAM: Last menstrual period 10/22/2019. There is no height or weight on file to calculate BMI. Physical Exam: Pt in no acute distress.  RECENT LABS AND TESTS: BMET    Component Value Date/Time   NA 141 10/26/2019 0000   K 4.7 10/26/2019 0000   CL 106 10/26/2019 0000   CO2 20 10/26/2019 0000   GLUCOSE 99 10/26/2019 0000   GLUCOSE 84 09/03/2017 0923   BUN 9 10/26/2019 0000   CREATININE 0.68 10/26/2019 0000   CALCIUM 9.8 10/26/2019 0000   GFRNONAA 117 10/26/2019 0000   GFRAA 135 10/26/2019 0000   Lab Results  Component Value Date   HGBA1C 5.4 10/26/2019   HGBA1C 5.5 06/08/2019   HGBA1C 5.5 12/03/2018   Lab Results  Component Value Date   INSULIN 36.1 (H) 10/26/2019   INSULIN 33.7 (H) 06/08/2019   INSULIN 42.1 (H) 12/03/2018   CBC    Component Value Date/Time   WBC 9.8 12/03/2018 1002   WBC 11.7 (H) 09/03/2017 0923   RBC 4.78 12/03/2018 1002   RBC 4.71 09/03/2017 0923   HGB 13.8 12/03/2018 1002   HCT 42.9 12/03/2018 1002   PLT 445.0 (H) 09/03/2017 0923   MCV 90 12/03/2018 1002   MCH 28.9 12/03/2018 1002   MCHC 32.2 12/03/2018 1002    MCHC 32.2 09/03/2017 0923   RDW 13.1 12/03/2018 1002   LYMPHSABS 3.2 (H) 12/03/2018 1002   MONOABS 0.8 09/03/2017 0923   EOSABS 0.2 12/03/2018 1002   BASOSABS 0.0 12/03/2018 1002   Iron/TIBC/Ferritin/ %Sat No results found for: IRON, TIBC, FERRITIN, IRONPCTSAT Lipid Panel     Component Value Date/Time   CHOL 151 10/26/2019 0000   TRIG 246 (H) 10/26/2019 0000   HDL 35 (L) 10/26/2019 0000   LDLCALC 75 10/26/2019 0000   Hepatic Function Panel     Component Value Date/Time   PROT 6.7 10/26/2019 0000   ALBUMIN 4.5 10/26/2019 0000   AST 19 10/26/2019 0000   ALT 17 10/26/2019 0000   ALKPHOS 113 10/26/2019 0000   BILITOT <0.2 10/26/2019 0000      Component Value Date/Time   TSH 2.150 12/03/2018 1002  TSH 2.45 09/03/2017 0923    OBESITY BEHAVIORAL INTERVENTION VISIT DOCUMENTATION FOR INSURANCE (~15 minutes)  I, Michaelene Song, am acting as Location manager for [employer], DO  I have reviewed the above documentation for accuracy and completeness, and I agree with the above. -Jearld Lesch, DO

## 2019-11-19 ENCOUNTER — Other Ambulatory Visit (INDEPENDENT_AMBULATORY_CARE_PROVIDER_SITE_OTHER): Payer: Self-pay | Admitting: Family Medicine

## 2019-11-19 DIAGNOSIS — E8881 Metabolic syndrome: Secondary | ICD-10-CM

## 2019-12-08 ENCOUNTER — Other Ambulatory Visit: Payer: Self-pay

## 2019-12-08 ENCOUNTER — Encounter (INDEPENDENT_AMBULATORY_CARE_PROVIDER_SITE_OTHER): Payer: Self-pay | Admitting: Family Medicine

## 2019-12-08 ENCOUNTER — Ambulatory Visit (INDEPENDENT_AMBULATORY_CARE_PROVIDER_SITE_OTHER): Payer: 59 | Admitting: Family Medicine

## 2019-12-08 VITALS — BP 114/76 | HR 107 | Temp 98.1°F | Ht 69.0 in | Wt 242.0 lb

## 2019-12-08 DIAGNOSIS — F418 Other specified anxiety disorders: Secondary | ICD-10-CM

## 2019-12-08 DIAGNOSIS — Z9189 Other specified personal risk factors, not elsewhere classified: Secondary | ICD-10-CM | POA: Diagnosis not present

## 2019-12-08 DIAGNOSIS — Z6835 Body mass index (BMI) 35.0-35.9, adult: Secondary | ICD-10-CM | POA: Diagnosis not present

## 2019-12-08 DIAGNOSIS — E66812 Obesity, class 2: Secondary | ICD-10-CM

## 2019-12-08 MED ORDER — ESCITALOPRAM OXALATE 10 MG PO TABS: 10.0000 mg | ORAL_TABLET | Freq: Every day | ORAL | 0 refills | Status: DC

## 2019-12-10 NOTE — Progress Notes (Signed)
Chief Complaint:   OBESITY Miranda Hunter is here to discuss her progress with her obesity treatment plan along with follow-up of her obesity related diagnoses. Miranda Hunter is keeping a food journal and adhering to recommended goals of 1900 calories and 90 grams of proteindaily and states she is following her eating plan approximately 40% of the time. Miranda Hunter states she is walking for 60 minutes 7 times per week.  Today's visit was #: 21 Starting weight: 257 lbs Starting date: 12/03/2018 Today's weight: 242 lbs Today's date: 12/08/2019 Total lbs lost to date: 15 Total lbs lost since last in-office visit: 0  Interim History: Miranda Hunter has been somewhat off track with journaling over the holidays, and with recent COVID19 infection. She has a lot of stress with work and she is not sleeping well. She likes journaling and she is doing the best she can.  Subjective:   1. Depression with anxiety Miranda Hunter feels her Lexapro has helped with her mood and emotional eating, but she is still struggling with sleep.  2. At risk for heart disease Miranda Hunter is at a higher than average risk for cardiovascular disease due to obesity. Reviewed: no chest pain on exertion, no dyspnea on exertion, and no swelling of ankles.  Assessment/Plan:   1. Depression with anxiety Behavior modification techniques were discussed today to help Miranda Hunter deal with her emotional/non-hunger eating behaviors. Miranda Hunter agrees to increase Lexapro to 20 mg. Orders and follow up as documented in patient record.   - escitalopram (LEXAPRO) 10 MG tablet; Take 1 tablet (10 mg total) by mouth daily.  Dispense: 30 tablet; Refill: 0  2. At risk for heart disease Miranda Hunter was given approximately 15 minutes of coronary artery disease prevention counseling today. She is 34 y.o. female and has risk factors for heart disease including obesity. We discussed intensive lifestyle modifications today with an emphasis on specific weight loss  instructions and strategies.   3. Class 2 severe obesity with serious comorbidity and body mass index (BMI) of 35.0 to 35.9 in adult, unspecified obesity type (HCC) Miranda Hunter is currently in the action stage of change. As such, her goal is to continue with weight loss efforts. She has agreed to keeping a food journal and adhering to recommended goals of 1900 calories and 100+ grams of protein daily.     We discussed the following behavioral modification strategies today: increasing lean protein intake.  Miranda Hunter has agreed to follow-up with our clinic in 3 weeks. She was informed of the importance of frequent follow-up visits to maximize her success with intensive lifestyle modifications for her multiple health conditions.   Objective:   Blood pressure 114/76, pulse (!) 107, temperature 98.1 F (36.7 C), temperature source Oral, height 5\' 9"  (1.753 m), weight 242 lb (109.8 kg), SpO2 96 %. Body mass index is 35.74 kg/m.  General: Cooperative, alert, well developed, in no acute distress. HEENT: Conjunctivae and lids unremarkable. Neck: No thyromegaly.  Cardiovascular: Regular rhythm.  Lungs: Normal work of breathing. Extremities: No edema.  Neurologic: No focal deficits.   Lab Results  Component Value Date   CREATININE 0.68 10/26/2019   BUN 9 10/26/2019   NA 141 10/26/2019   K 4.7 10/26/2019   CL 106 10/26/2019   CO2 20 10/26/2019   Lab Results  Component Value Date   ALT 17 10/26/2019   AST 19 10/26/2019   ALKPHOS 113 10/26/2019   BILITOT <0.2 10/26/2019   Lab Results  Component Value Date   HGBA1C 5.4 10/26/2019  HGBA1C 5.5 06/08/2019   HGBA1C 5.5 12/03/2018   Lab Results  Component Value Date   INSULIN 36.1 (H) 10/26/2019   INSULIN 33.7 (H) 06/08/2019   INSULIN 42.1 (H) 12/03/2018   Lab Results  Component Value Date   TSH 2.150 12/03/2018   Lab Results  Component Value Date   CHOL 151 10/26/2019   HDL 35 (L) 10/26/2019   LDLCALC 75 10/26/2019    TRIG 246 (H) 10/26/2019   Lab Results  Component Value Date   WBC 9.8 12/03/2018   HGB 13.8 12/03/2018   HCT 42.9 12/03/2018   MCV 90 12/03/2018   PLT 445.0 (H) 09/03/2017   No results found for: IRON, TIBC, FERRITIN  Attestation Statements:   Reviewed by clinician on day of visit: allergies, medications, problem list, medical history, surgical history, family history, social history, and previous encounter notes.   I, Trixie Dredge, am acting as transcriptionist for Dennard Nip, MD.  I have reviewed the above documentation for accuracy and completeness, and I agree with the above. -  Dennard Nip, MD

## 2019-12-30 ENCOUNTER — Ambulatory Visit (INDEPENDENT_AMBULATORY_CARE_PROVIDER_SITE_OTHER): Payer: 59 | Admitting: Family Medicine

## 2019-12-30 ENCOUNTER — Other Ambulatory Visit: Payer: Self-pay

## 2019-12-30 VITALS — BP 107/75 | HR 97 | Temp 98.2°F | Ht 69.0 in | Wt 244.0 lb

## 2019-12-30 DIAGNOSIS — Z9189 Other specified personal risk factors, not elsewhere classified: Secondary | ICD-10-CM

## 2019-12-30 DIAGNOSIS — F418 Other specified anxiety disorders: Secondary | ICD-10-CM | POA: Diagnosis not present

## 2019-12-30 DIAGNOSIS — E8881 Metabolic syndrome: Secondary | ICD-10-CM

## 2019-12-30 DIAGNOSIS — Z6836 Body mass index (BMI) 36.0-36.9, adult: Secondary | ICD-10-CM

## 2019-12-30 MED ORDER — ESCITALOPRAM OXALATE 20 MG PO TABS: 20.0000 mg | ORAL_TABLET | Freq: Every day | ORAL | 0 refills | Status: DC

## 2019-12-30 MED ORDER — METFORMIN HCL 500 MG PO TABS: 500.0000 mg | ORAL_TABLET | Freq: Two times a day (BID) | ORAL | 0 refills | Status: DC

## 2019-12-31 NOTE — Progress Notes (Signed)
Chief Complaint:   OBESITY Miranda Hunter is here to discuss her progress with her obesity treatment plan along with follow-up of her obesity related diagnoses. Miranda Hunter is on keeping a food journal and adhering to recommended goals of 1900 calories and 100+ grams of protein daily and states she is following her eating plan approximately 50% of the time. Miranda Hunter states she is walking the dog for 60 minutes 7 times per week.  Today's visit was #: 22 Starting weight: 257 lbs Starting date: 12/03/2018 Today's weight: 244 lbs Today's date: 12/30/2019 Total lbs lost to date: 13 Total lbs lost since last in-office visit: 0  Interim History: Miranda Hunter has struggled a bit more since our last visit. She is missing some meals and then having increased hunger in the PM.  Subjective:   1. Insulin resistance Miranda Hunter is stable on metformin, and denies nausea or vomiting. She is working on diet and exercise, and decreasing simple carbohydrates.  2. Depression with anxiety Miranda Hunter increased her Lexapro and she notes feeling better with increased dose of Lexapro. She is sleeping better overall.  3. At risk for nausea Miranda Hunter is at higher risk of nausea due to medications.  Assessment/Plan:   1. Insulin resistance Miranda Hunter will continue to work on weight loss, exercise, and decreasing simple carbohydrates to help decrease the risk of diabetes. We will refill metformin for 1 month. Miranda Hunter agreed to follow-up with [place] as directed to closely monitor her progress.  - metFORMIN (GLUCOPHAGE) 500 MG tablet; Take 1 tablet (500 mg total) by mouth 2 (two) times daily with a meal.  Dispense: 60 tablet; Refill: 0  2. Depression with anxiety Behavior modification techniques were discussed today to help Miranda Hunter deal with her emotional/non-hunger eating behaviors. We will refill Lexapro for 1 month. Orders and follow up as documented in patient record.   - escitalopram (LEXAPRO) 20 MG tablet; Take 1 tablet (20  mg total) by mouth daily.  Dispense: 30 tablet; Refill: 0  3. At risk for nausea Miranda Hunter was given approximately 15 minutes of nausea prevention counseling today. Miranda Hunter is at risk for nausea due to her new or current medication. She was encouraged to titrate her medication slowly, make sure to stay hydrated, eat smaller portions throughout the day, and avoid high fat meals.   4. Class 2 severe obesity with serious comorbidity and body mass index (BMI) of 36.0 to 36.9 in adult, unspecified obesity type (HCC) Miranda Hunter is currently in the action stage of change. As such, her goal is to continue with weight loss efforts. She has agreed to keeping a food journal and adhering to recommended goals of 1900 calories and 100+ grams of protein daily.   Behavioral modification strategies: no skipping meals.  Miranda Hunter has agreed to follow-up with our clinic in 3 weeks. She was informed of the importance of frequent follow-up visits to maximize her success with intensive lifestyle modifications for her multiple health conditions.   Objective:   Blood pressure 107/75, pulse 97, temperature 98.2 F (36.8 C), temperature source Oral, height 5\' 9"  (1.753 m), weight 244 lb (110.7 kg), last menstrual period 12/11/2019, SpO2 95 %. Body mass index is 36.03 kg/m.  General: Cooperative, alert, well developed, in no acute distress. HEENT: Conjunctivae and lids unremarkable. Cardiovascular: Regular rhythm.  Lungs: Normal work of breathing. Neurologic: No focal deficits.   Lab Results  Component Value Date   CREATININE 0.68 10/26/2019   BUN 9 10/26/2019   NA 141 10/26/2019   K 4.7  10/26/2019   CL 106 10/26/2019   CO2 20 10/26/2019   Lab Results  Component Value Date   ALT 17 10/26/2019   AST 19 10/26/2019   ALKPHOS 113 10/26/2019   BILITOT <0.2 10/26/2019   Lab Results  Component Value Date   HGBA1C 5.4 10/26/2019   HGBA1C 5.5 06/08/2019   HGBA1C 5.5 12/03/2018   Lab Results    Component Value Date   INSULIN 36.1 (H) 10/26/2019   INSULIN 33.7 (H) 06/08/2019   INSULIN 42.1 (H) 12/03/2018   Lab Results  Component Value Date   TSH 2.150 12/03/2018   Lab Results  Component Value Date   CHOL 151 10/26/2019   HDL 35 (L) 10/26/2019   LDLCALC 75 10/26/2019   TRIG 246 (H) 10/26/2019   Lab Results  Component Value Date   WBC 9.8 12/03/2018   HGB 13.8 12/03/2018   HCT 42.9 12/03/2018   MCV 90 12/03/2018   PLT 445.0 (H) 09/03/2017   No results found for: IRON, TIBC, FERRITIN  Attestation Statements:   Reviewed by clinician on day of visit: allergies, medications, problem list, medical history, surgical history, family history, social history, and previous encounter notes.   I, Trixie Dredge, am acting as transcriptionist for Dennard Nip, MD.  I have reviewed the above documentation for accuracy and completeness, and I agree with the above. -  Dennard Nip, MD

## 2020-01-12 ENCOUNTER — Ambulatory Visit: Payer: 59 | Admitting: Neurology

## 2020-01-12 ENCOUNTER — Other Ambulatory Visit: Payer: Self-pay

## 2020-01-12 ENCOUNTER — Encounter: Payer: Self-pay | Admitting: Neurology

## 2020-01-12 VITALS — BP 118/84 | HR 87 | Temp 97.3°F | Ht 69.0 in | Wt 250.0 lb

## 2020-01-12 DIAGNOSIS — F909 Attention-deficit hyperactivity disorder, unspecified type: Secondary | ICD-10-CM | POA: Diagnosis not present

## 2020-01-12 DIAGNOSIS — G43109 Migraine with aura, not intractable, without status migrainosus: Secondary | ICD-10-CM

## 2020-01-12 MED ORDER — PROMETHAZINE HCL 25 MG PO TABS: 25.0000 mg | ORAL_TABLET | Freq: Four times a day (QID) | ORAL | 6 refills | Status: DC | PRN

## 2020-01-12 MED ORDER — ONDANSETRON 4 MG PO TBDP: 4.0000 mg | ORAL_TABLET | Freq: Three times a day (TID) | ORAL | 11 refills | Status: DC | PRN

## 2020-01-12 MED ORDER — RIZATRIPTAN BENZOATE 10 MG PO TBDP: 10.0000 mg | ORAL_TABLET | ORAL | 11 refills | Status: DC | PRN

## 2020-01-12 NOTE — Patient Instructions (Signed)
[facility] Phenergan, zofran, maxalt prn  Ondansetron oral dissolving tablet What is this medicine? ONDANSETRON (on DAN se tron) is used to treat nausea and vomiting caused by chemotherapy. It is also used to prevent or treat nausea and vomiting after surgery. This medicine may be used for other purposes; ask your health care provider or pharmacist if you have questions. COMMON BRAND NAME(S): Zofran ODT What should I tell my health care provider before I take this medicine? They need to know if you have any of these conditions:  heart disease  history of irregular heartbeat  liver disease  low levels of magnesium or potassium in the blood  an unusual or allergic reaction to ondansetron, granisetron, other medicines, foods, dyes, or preservatives  pregnant or trying to get pregnant  breast-feeding How should I use this medicine? These tablets are made to dissolve in the mouth. Do not try to push the tablet through the foil backing. With dry hands, peel away the foil backing and gently remove the tablet. Place the tablet in the mouth and allow it to dissolve, then swallow. While you may take these tablets with water, it is not necessary to do so. Talk to your pediatrician regarding the use of this medicine in children. Special care may be needed. Overdosage: If you think you have taken too much of this medicine contact a poison control center or emergency room at once. NOTE: This medicine is only for you. Do not share this medicine with others. What if I miss a dose? If you miss a dose, take it as soon as you can. If it is almost time for your next dose, take only that dose. Do not take double or extra doses. What may interact with this medicine? Do not take this medicine with any of the following medications:  apomorphine  certain medicines for fungal infections like fluconazole, itraconazole, ketoconazole, posaconazole,  voriconazole  cisapride  dronedarone  pimozide  thioridazine This medicine may also interact with the following medications:  carbamazepine  certain medicines for depression, anxiety, or psychotic disturbances  fentanyl  linezolid  MAOIs like Carbex, Eldepryl, Marplan, Nardil, and Parnate  methylene blue (injected into a vein)  other medicines that prolong the QT interval (cause an abnormal heart rhythm) like dofetilide, ziprasidone  phenytoin  rifampicin  tramadol This list may not describe all possible interactions. Give your health care provider a list of all the medicines, herbs, non-prescription drugs, or dietary supplements you use. Also tell them if you smoke, drink alcohol, or use illegal drugs. Some items may interact with your medicine. What should I watch for while using this medicine? Check with your doctor or health care professional as soon as you can if you have any sign of an allergic reaction. What side effects may I notice from receiving this medicine? Side effects that you should report to your doctor or health care professional as soon as possible:  allergic reactions like skin rash, itching or hives, swelling of the face, lips, or tongue  breathing problems  confusion  dizziness  fast or irregular heartbeat  feeling faint or lightheaded, falls  fever and chills  loss of balance or coordination  seizures  sweating  swelling of the hands and feet  tightness in the chest  tremors  unusually weak or tired Side effects that usually do not require medical attention (report to your doctor or health care professional if they continue or are bothersome):  constipation or diarrhea  headache This list  may not describe all possible side effects. Call your doctor for medical advice about side effects. You may report side effects to FDA at 1-800-FDA-1088. Where should I keep my medicine? Keep out of the reach of children. Store between 2  and 30 degrees C (36 and 86 degrees F). Throw away any unused medicine after the expiration date. NOTE: This sheet is a summary. It may not cover all possible information. If you have questions about this medicine, talk to your doctor, pharmacist, or health care provider.  2020 [employer]vier/Gold Standard (2018-11-04 07:14:10) Promethazine tablets What is this medicine? PROMETHAZINE (proe METH a zeen) is an antihistamine. It is used to treat allergic reactions and to treat or prevent nausea and vomiting from illness or motion sickness. It is also used to make you sleep before surgery, and to help treat pain or nausea after surgery. This medicine may be used for other purposes; ask your health care provider or pharmacist if you have questions. COMMON BRAND NAME(S): Phenergan What should I tell my health care provider before I take this medicine? They need to know if you have any of these conditions:  blockage in your bowel  diabetes  glaucoma  have trouble controlling your muscles  heart disease  liver disease  low blood counts, like low white cell, platelet, or red cell counts  lung or breathing disease, like asthma  Parkinson's disease  prostate disease  seizures  stomach or intestine problems  trouble passing urine  an unusual or allergic reaction to promethazine, sulfites, other medicines, foods, dyes, or preservatives  pregnant or trying to get pregnant  breast-feeding How should I use this medicine? Take this medicine by mouth with a glass of water. Follow the directions on the prescription label. Take your doses at regular intervals. Do not take your medicine more often than directed. Talk to your pediatrician regarding the use of this medicine in children. Special care may be needed. This medicine should not be given to infants and children younger than 47 years old. Overdosage: If you think you have taken too much of this medicine contact a poison control center or  emergency room at once. NOTE: This medicine is only for you. Do not share this medicine with others. What if I miss a dose? If you miss a dose, take it as soon as you can. If it is almost time for your next dose, take only that dose. Do not take double or extra doses. What may interact with this medicine?  alcohol  antihistamines for allergy, cough, and cold  atropine  certain medicines for anxiety or sleep  certain medicines for bladder problems like oxybutynin, tolterodine  certain medicines for depression like amitriptyline, fluoxetine, sertraline  certain medicines for Parkinson's disease like benztropine, trihexyphenidyl  certain medicines for stomach problems like dicyclomine, hyoscyamine  certain medicines for travel sickness like scopolamine  epinephrine  general anesthetics like halothane, isoflurane, methoxyflurane, propofol  ipratropium  MAOIs like Marplan, Nardil, and Parnate  medicines for high blood pressure  medicines for seizures like phenobarbital, primidone, phenytoin  medicines that relax muscles for surgery  metoclopramide  narcotic medicines for pain This list may not describe all possible interactions. Give your health care provider a list of all the medicines, herbs, non-prescription drugs, or dietary supplements you use. Also tell them if you smoke, drink alcohol, or use illegal drugs. Some items may interact with your medicine. What should I watch for while using this medicine? Visit your health care professional for regular checks  on your progress. Tell your health care professional if symptoms do not start to get better or if they get worse. You may get drowsy or dizzy. Do not drive, use machinery, or do anything that needs mental alertness until you know how this medicine affects you. To reduce the risk of dizzy or fainting spells, do not stand or sit up quickly, especially if you are an older patient. Alcohol may increase dizziness and  drowsiness. Avoid alcoholic drinks. Your mouth may get dry. Chewing sugarless gum or sucking hard candy, and drinking plenty of water may help. Contact your doctor if the problem does not go away or is severe. This medicine may cause dry eyes and blurred vision. If you wear contact lenses you may feel some discomfort. Lubricating drops may help. See your eye doctor if the problem does not go away or is severe. This medicine can make you more sensitive to the sun. Keep out of the sun. If you cannot avoid being in the sun, wear protective clothing and use sunscreen. Do not use sun lamps or tanning beds/booths. This medicine may increase blood sugar. Ask your health care provider if changes in diet or medicines are needed if you have diabetes. What side effects may I notice from receiving this medicine? Side effects that you should report to your doctor or health care professional as soon as possible:  allergic reactions like skin rash, itching or hives, swelling of the face, lips, or tongue  breathing problems  changes in vision  confusion  fever, chills, sore throat  pain, redness, or irritation at site where injected  seizures  signs and symptoms of high blood sugar such as being more thirsty or hungry or having to urinate more than normal. You may also feel very tired or have blurry vision.  signs and symptoms of liver injury like dark yellow or brown urine; general ill feeling or flu-like symptoms; light-colored stools; loss of appetite; nausea; right upper belly pain; unusually weak or tired; yellowing of the eyes or skin  signs and symptoms of low blood pressure like dizziness; feeling faint or lightheaded, falls; unusually weak or tired  trouble passing urine or change in the amount of urine  trouble swallowing  uncontrollable movements of the arms, face, head, mouth, neck, or upper body  unusual bruising or bleeding  unusually weak or tired Side effects that usually do not  require medical attention (report to your doctor or health care professional if they continue or are bothersome):  drowsiness  dry mouth  restlessness This list may not describe all possible side effects. Call your doctor for medical advice about side effects. You may report side effects to FDA at 1-800-FDA-1088. Where should I keep my medicine? Keep out of the reach of children. Store at room temperature, between 20 and 25 degrees C (68 and 77 degrees F). Protect from light. Throw away any unused medicine after the expiration date. NOTE: This sheet is a summary. It may not cover all possible information. If you have questions about this medicine, talk to your doctor, pharmacist, or health care provider.  2020 [employer]vier/Gold Standard (2019-09-21 13:27:34)

## 2020-01-12 NOTE — Progress Notes (Signed)
[facility]    Provider:  Dr Lucia Gaskins Referring Provider: Jarold Motto, PA Primary Care Physician:  Jarold Motto, PA  CC:  Migraines  Interval 01/12/2020: She feels her migraines are well controlled. She has been trying to manage her birth control, she was taken off of the depo shot. She has migraine with aura and obgyn is aware of restrictions there as is patient. Meds tried include topamax, lexapro, maxalt, rizatriptan, wellbutrin, aimovig, cambia, frova, zofran, prednisone, propranolol, nortriptyline. She is having difficulty with her ADD and she was on aderal in the past, she also has anxiety I feel she would benefit from someone professionally manage this will [facility] to [facility].  She feels her add trying to concerntrate is a trigger. 8 dull headaches a month which are mild, 2 migraine days a month which are treated acutely. She always has an aura. Maxalt does hekp when she has the headaches/migraines and the dull headaches are not bad. The nausea has been the worst, we can try phenergan as zofran not helping.   Interval history 10/14/2018: changed BC method and since then daily headaches. She was on depo q11months and did well but had to stop it because of the bone density fear. It'll take time on the new birth control medication, she is having migraines daily.  We spent an extended amount of time discussing birth control and migraines.  She was just changed to low-dose estrogen, distressed stroke risks in patients with migraine aura.  Medication she is on is very similar to NuvaRing, I do like NuvaRing in the management of migraine headaches as it appears to keep the estrogen levels very stable which is important and migraines.  At this point I do agree with her gynecologist at that it just may take several months to become adjusted to the new medications.  Discussed with patient why she wanted to do in the meantime, she is gained lots of weight and so was in  favor of starting Topamax as a preventative but I discussed she probably needs a weight loss program so we will [facility] with Dr. Orlene Plum.  We can also give her samples of Aimovig for several months.  Asked her to email me in 6 to 8 weeks and if she is feeling better she may go back to her prior baseline and not need preventatives.  HPI:  Miranda Hunter is a 34 y.o. female here as a referral from Dr. Bufford Buttner for migraines. Mother is here and provides information. She started having migraines in HS and then worsened as a freshman in college. [facility] and [facility] medications. No known triggers. Managing her menstruation helped. In October her migraine lasted 9 days. She was vomiting, was so painful, 10/10 pain and this was her tipping point. She has had no auras but also has an aura, starts on the left side, throbbing, pulsating, light/sound/smell sensitivity, dark room better, movement makes it worse, her whole body hurts. Extensive family history of migraines in mother, grandmother. She has numbness in the toes and tongue, no weakness. Every 3 months she gets a migraine and it is predictable. No other focal neurologic deficits, associated symptoms, inciting events or modifiable factors.  Meds tried: propranolol, Topiramate, nortriptyline  Reviewed notes, labs and imaging from outside physicians, which showed:  Reviewed referring physician's notes, patient was last seen in October 2018 and she had been dealing with a migraine for  8 days, 3-4 clusters of migraines throughout the day, in the past she typically would get one headache per month prior to.,  Not sexually active, Imitrex did not work and gave her side effects, butalbital makes her very moody and temperamental, has never been on a preventative medication.  In the past she worked on diet elimination but this was not helpful, headaches would typically come and go  around her.  Periods started at the age of 65, worsened as a freshman in college.  She [facility] did biofeedback without relief.  Did not do acupuncture.  Her OB/GYN put her on Depo-Provera which has helped for the last 3 years.  Tried Nexplanon for 2 years but discontinued due to continuous headache and bleeding.  Never had imaging of her brain.  TSH, CMP, HIV, CBC all unremarkable only abnormality with slightly elevated white blood cells 11.7.  And elevated platelets 445.  Review of Systems: Patient complains of symptoms per HPI as well as the following symptoms: difficulty with concentration, ADD. Pertinent negatives and positives per HPI. All others negative.   Social History   Socioeconomic History  . Marital status: Single    Spouse name: Not on file  . Number of children: 0  . Years of education: Not on file  . Highest education level: Bachelor's degree (e.g., BA, AB, BS)  Occupational History  . Not on file  Tobacco Use  . Smoking status: Never Smoker  . Smokeless tobacco: Never Used  Substance and Sexual Activity  . Alcohol use: Yes    Comment: socially  . Drug use: No  . Sexual activity: Not Currently    Comment: kyleena  Other Topics Concern  . Not on file  Social History Narrative   She is a Psychiatric nurseSam"   Recently got a new puppy- a Advertising account planner named [name]   Caffeine once a week      Social Determinants of Corporate investment banker Strain:   . Difficulty of Paying Living Expenses: Not on file  Food Insecurity:   . Worried About Programme researcher, broadcasting/film/video in the Last Year: Not on file  . Ran Out of Food in the Last Year: Not on file  Transportation Needs:   . Lack of Transportation (Medical): Not on file  . Lack of Transportation (Non-Medical): Not on file  Physical Activity:   . Days of Exercise per Week: Not on file  . Minutes of Exercise per Session: Not on file  Stress:   . Feeling of Stress : Not on file  Social  Connections:   . Frequency of Communication with Friends and Family: Not on file  . Frequency of Social Gatherings with Friends and Family: Not on file  . Attends Religious Services: Not on file  . Active Member of Clubs or Organizations: Not on file  . Attends Banker Meetings: Not on file  . Marital Status: Not on file  Intimate Partner Violence:   . Fear of Current or Ex-Partner: Not on file  . Emotionally Abused: Not on file  . Physically Abused: Not on file  . Sexually Abused: Not on file    Family History  Problem Relation Age of Onset  . Migraines Mother   . Prostate cancer Father   . Hypertension Father   . Asthma Sister   . Lung cancer Maternal Grandmother        heavy smoker  . Migraines Maternal Grandmother   .  Lung cancer Maternal Grandfather        heavy smoker  . Asthma Sister     Past Medical History:  Diagnosis Date  . Anxiety   . Asthma   . History of chicken pox   . IBS (irritable bowel syndrome)   . Migraines     Past Surgical History:  Procedure Laterality Date  . tonsillectomy and adnoidectomy Bilateral 2007    Current Outpatient Medications  Medication Sig Dispense Refill  . albuterol (PROVENTIL HFA;VENTOLIN HFA) 108 (90 Base) MCG/ACT inhaler Inhale 2 puffs into the lungs every 6 (six) hours as needed for wheezing or shortness of breath. 1 Inhaler 2  . cetirizine (ZYRTEC) 10 MG tablet Take 10 mg by mouth daily.    Marland Kitchen escitalopram (LEXAPRO) 20 MG tablet Take 1 tablet (20 mg total) by mouth daily. 30 tablet 0  . Flaxseed, Linseed, (FLAX SEEDS PO) Take 1 tablet by mouth at bedtime.    Marland Kitchen ketorolac (TORADOL) 10 MG tablet Take 1 tablet (10 mg total) by mouth every 6 (six) hours as needed. 20 tablet 11  . Levonorgestrel (KYLEENA IU) by Intrauterine route.    . metFORMIN (GLUCOPHAGE) 500 MG tablet Take 1 tablet (500 mg total) by mouth 2 (two) times daily with a meal. 60 tablet 0  . Multiple Vitamin (MULTIVITAMIN) tablet Take 1 tablet by  mouth daily.    . ondansetron (ZOFRAN-ODT) 4 MG disintegrating tablet Take 1-2 tablets (4-8 mg total) by mouth every 8 (eight) hours as needed for nausea. 30 tablet 11  . rizatriptan (MAXALT-MLT) 10 MG disintegrating tablet Take 1 tablet (10 mg total) by mouth as needed for migraine. May repeat in 2 hours if needed 9 tablet 11  . promethazine (PHENERGAN) 25 MG tablet Take 1 tablet (25 mg total) by mouth every 6 (six) hours as needed for nausea or vomiting. 30 tablet 6   No current facility-administered medications for this visit.    Allergies as of 01/12/2020 - Review Complete 01/12/2020  Allergen Reaction Noted  . Amoxicillin-pot clavulanate Diarrhea 12/04/2016    Vitals: BP 118/84 (BP Location: Right Arm, Patient Position: Sitting, Cuff Size: Large)   Pulse 87   Temp (!) 97.3 F (36.3 C) Comment: taken at front  Ht 5\' 9"  (1.753 m)   Wt 250 lb (113.4 kg)   BMI 36.92 kg/m  Last Weight:  Wt Readings from Last 1 Encounters:  01/12/20 250 lb (113.4 kg)   Last Height:   Ht Readings from Last 1 Encounters:  01/12/20 5\' 9"  (1.753 m)   Physical exam: Exam: Gen: NAD, conversant, well nourised, obese, well groomed                     CV: RRR, no MRG. No Carotid Bruits. No peripheral edema, warm, nontender Eyes: Conjunctivae clear without exudates or hemorrhage  Neuro: Detailed Neurologic Exam  Speech:    Speech is normal; fluent and spontaneous with normal comprehension.  Cognition:    The patient is oriented to person, place, and time;     recent and remote memory intact;     language fluent;     normal attention, concentration,     fund of knowledge Cranial Nerves:    The pupils are equal, round, and reactive to light. The fundi are normal and spontaneous venous pulsations are present. Visual fields are full to finger confrontation. Extraocular movements are intact. Trigeminal sensation is intact and the muscles of mastication are normal. The face is symmetric.  The palate  elevates in the midline. Hearing intact. Voice is normal. Shoulder shrug is normal. The tongue has normal motion without fasciculations.   Coordination:    Normal finger to nose and heel to shin. Normal rapid alternating movements.   Gait:    Heel-toe and tandem gait are normal.   Motor Observation:    No asymmetry, no atrophy, and no involuntary movements noted. Tone:    Normal muscle tone.    Posture:    Posture is normal. normal erect    Strength:    Strength is V/V in the upper and lower limbs.      Sensation: intact to LT     Reflex Exam:  DTR's:    Deep tendon reflexes in the upper and lower extremities are normal bilaterally.   Toes:    The toes are downgoing bilaterally.   Clonus:    Clonus is absent.    Assessment/Plan: This is a very nice 34 year old patient with episodic migraine with aura.    - She reports she has 2 migraine days a month that are controlled with Maxalt, her nausea is not controlled so we will prescribe Phenergan today.  Given this frequency of migraines I do not think that is necessary to start her on a migraine preventative.  At this time for her migraines we will manage her acutely given the frequency of 2 migraine days a month.  We will have her try some Phenergan for her nausea.  - She does work closely with her gynecologist for birth control given she has migraine with aura with increased stroke risk.    - She also goes to the [facility] and says that has been a life saver through Covid especially Dr. Leafy Ro.    - She is having difficulty with her ADD and she was on adderall in the past, she also has anxiety I feel she would benefit from someone professionally manage this will [facility] to [facility].   Orders Placed This Encounter  Procedures  . Ambulatory referral to Psychiatry   Meds ordered this encounter  Medications  . promethazine (PHENERGAN) 25 MG tablet    Sig: Take 1 tablet (25 mg total) by  mouth every 6 (six) hours as needed for nausea or vomiting.    Dispense:  30 tablet    Refill:  6  . rizatriptan (MAXALT-MLT) 10 MG disintegrating tablet    Sig: Take 1 tablet (10 mg total) by mouth as needed for migraine. May repeat in 2 hours if needed    Dispense:  9 tablet    Refill:  11  . ondansetron (ZOFRAN-ODT) 4 MG disintegrating tablet    Sig: Take 1-2 tablets (4-8 mg total) by mouth every 8 (eight) hours as needed for nausea.    Dispense:  30 tablet    Refill:  11   Discussed:  There is increased risk for stroke in women with migraine with aura and a  Contraindication for the combined contraceptive pill for use by women who have migraine with aura, which is in line with World Health Organisation recommendations. The risk for women with migraine without aura is lower and other risk factors like smoking are far more likely to increase stroke risk than migraine. There is a recommendation for no smoking and for the use of low estrogen or progestogen only pills particularly for women with migraine with aura. It is important however that women with migraine who are taking the pill do  not decide to suddenly stop taking it without discussing this with their doctor. Please discuss with her OB/GYN.  Discussed: To prevent or relieve headaches, try the following: Cool Compress. Lie down and place a cool compress on your head.  Avoid headache triggers. If certain foods or odors seem to have triggered your migraines in the past, avoid them. A headache diary might help you identify triggers.  Include physical activity in your daily routine. Try a daily walk or other moderate aerobic exercise.  Manage stress. Find healthy ways to cope with the stressors, such as delegating tasks on your to-do list.  Practice relaxation techniques. Try deep breathing, yoga, massage and visualization.  Eat regularly. Eating regularly scheduled meals and maintaining a healthy diet might help prevent headaches. Also,  drink plenty of fluids.  Follow a regular sleep schedule. Sleep deprivation might contribute to headaches Consider biofeedback. With this mind-body technique, you learn to control certain bodily functions -- such as muscle tension, heart rate and blood pressure -- to prevent headaches or reduce headache pain.    Proceed to emergency room if you experience new or worsening symptoms or symptoms do not resolve, if you have new neurologic symptoms or if headache is severe, or for any concerning symptom.   Provided education and documentation from American headache Society toolbox including articles on: chronic migraine medication overuse headache, chronic migraines, prevention of migraines, behavioral and other nonpharmacologic treatments for headache.  Cc: Jarold Motto, PA, Ellis Savage PA, Quillian Quince MD  Naomie Dean, MD  [facility] [address] [place], [place] [address]  Phone [phone] Fax [phone]  A total of 40 minutes was spent face-to-face with this patient. Over half this time was spent on counseling patient on the  1. Migraine with aura and without status migrainosus, not intractable   2. Attention deficit hyperactivity disorder (ADHD), unspecified ADHD type    diagnosis and different diagnostic and therapeutic options, counseling and coordination of care, risks ans benefits of management, compliance, or risk factor reduction and education.

## 2020-01-20 ENCOUNTER — Ambulatory Visit (INDEPENDENT_AMBULATORY_CARE_PROVIDER_SITE_OTHER): Payer: 59 | Admitting: Family Medicine

## 2020-01-20 ENCOUNTER — Other Ambulatory Visit: Payer: Self-pay

## 2020-01-20 ENCOUNTER — Encounter (INDEPENDENT_AMBULATORY_CARE_PROVIDER_SITE_OTHER): Payer: Self-pay | Admitting: Family Medicine

## 2020-01-20 VITALS — BP 113/77 | HR 109 | Temp 98.0°F | Ht 69.0 in | Wt 246.0 lb

## 2020-01-20 DIAGNOSIS — F909 Attention-deficit hyperactivity disorder, unspecified type: Secondary | ICD-10-CM | POA: Diagnosis not present

## 2020-01-20 DIAGNOSIS — Z9189 Other specified personal risk factors, not elsewhere classified: Secondary | ICD-10-CM | POA: Diagnosis not present

## 2020-01-20 DIAGNOSIS — Z6836 Body mass index (BMI) 36.0-36.9, adult: Secondary | ICD-10-CM

## 2020-01-20 DIAGNOSIS — E8881 Metabolic syndrome: Secondary | ICD-10-CM | POA: Diagnosis not present

## 2020-01-20 DIAGNOSIS — F3289 Other specified depressive episodes: Secondary | ICD-10-CM

## 2020-01-20 MED ORDER — METFORMIN HCL 500 MG PO TABS: 500.0000 mg | ORAL_TABLET | Freq: Two times a day (BID) | ORAL | 0 refills | Status: DC

## 2020-01-20 MED ORDER — ATOMOXETINE HCL 10 MG PO CAPS: 10.0000 mg | ORAL_CAPSULE | Freq: Every day | ORAL | 0 refills | Status: DC

## 2020-01-20 MED ORDER — ESCITALOPRAM OXALATE 20 MG PO TABS: 20.0000 mg | ORAL_TABLET | Freq: Every day | ORAL | 0 refills | Status: DC

## 2020-01-20 NOTE — Progress Notes (Signed)
Chief Complaint:   OBESITY Miranda Hunter is here to discuss her progress with her obesity treatment plan along with follow-up of her obesity related diagnoses. Miranda Hunter is on keeping a food journal and adhering to recommended goals of 1900 calories and 100+ grams of protein daily and states she is following her eating plan approximately 75% of the time. Miranda Hunter states she is walking for 30 minutes 2 times per day.  Today's visit was #: 23 Starting weight: 257 lbs Starting date: 12/03/2018 Today's weight: 246 lbs Today's date: 01/20/2020 Total lbs lost to date: 11 Total lbs lost since last in-office visit: 0  Interim History: Miranda Hunter has struggled to journal as strictly and notes increased stress eating. She has had a lot of stress in her life with family and work.  Subjective:   1. Attention deficit hyperactivity disorder (ADHD) Miranda Hunter has a diagnosis of ADHD as a teen, and she had been on Adderall while in high school. She notes increased irritability and decreased attention span is affecting her work.   2. Other depression Miranda Hunter's mood is stable on Lexapro overall, but she has had some increased irritability. She thinks it may be related to ADHD or hormone changes.  3. Insulin resistance Miranda Hunter is stable on metformin and she is working on diet. She requests a refill today.  4. At risk for side effect of medication Miranda Hunter is at risk of drug side effects due to the combinations of Lexapro and Strattera, but the risk of side effects at her current dose is very low.  Assessment/Plan:   1. Attention deficit hyperactivity disorder (ADHD) Miranda Hunter agreed to start Strattera 10 mg daily with no refill. She is to keep her appointment with the ADHD clinic.   - atomoxetine (STRATTERA) 10 MG capsule; Take 1 capsule (10 mg total) by mouth daily.  Dispense: 30 capsule; Refill: 0  2. Other depression Behavior modification techniques were discussed today to help Miranda Hunter deal with  her emotional/non-hunger eating behaviors. We will refill Lexapro for 1 month, and will watch for signs of serotonin syndrome. Orders and follow up as documented in patient record.   - escitalopram (LEXAPRO) 20 MG tablet; Take 1 tablet (20 mg total) by mouth daily.  Dispense: 30 tablet; Refill: 0  3. Insulin resistance Miranda Hunter will continue to work on weight loss, exercise, and decreasing simple carbohydrates to help decrease the risk of diabetes. We will refill metformin for 1 month. Miranda Hunter agreed to follow-up with [place] as directed to closely monitor her progress.  - metFORMIN (GLUCOPHAGE) 500 MG tablet; Take 1 tablet (500 mg total) by mouth 2 (two) times daily with a meal.  Dispense: 60 tablet; Refill: 0  4. At risk for side effect of medication Miranda Hunter was given approximately 15 minutes of drug side effect counseling today. We discussed side effect possibility and risk versus benefits. Miranda Hunter agreed to the medication and will contact this office if these side effects are intolerable.  Repetitive spaced learning was employed today to elicit superior memory formation and behavioral change.  5. Class 2 severe obesity with serious comorbidity and body mass index (BMI) of 36.0 to 36.9 in adult, unspecified obesity type (HCC) Miranda Hunter is currently in the action stage of change. As such, her goal is to maintain weight for now while out of the office. She has agreed to keeping a food journal and adhering to recommended goals of 1900 calories and 100 grams of protein.   Exercise goals: Miranda Hunter is to continue her current exercise  regimen as is.  Behavioral modification strategies: increasing lean protein intake and travel eating strategies.  Miranda Hunter has agreed to follow-up with our clinic in 4 weeks. She was informed of the importance of frequent follow-up visits to maximize her success with intensive lifestyle modifications for her multiple health conditions.   Objective:   Blood pressure  113/77, pulse (!) 109, temperature 98 F (36.7 C), height 5\' 9"  (1.753 m), weight 246 lb (111.6 kg), SpO2 96 %. Body mass index is 36.33 kg/m.  General: Cooperative, alert, well developed, in no acute distress. HEENT: Conjunctivae and lids unremarkable. Cardiovascular: Regular rhythm.  Lungs: Normal work of breathing. Neurologic: No focal deficits.   Lab Results  Component Value Date   CREATININE 0.68 10/26/2019   BUN 9 10/26/2019   NA 141 10/26/2019   K 4.7 10/26/2019   CL 106 10/26/2019   CO2 20 10/26/2019   Lab Results  Component Value Date   ALT 17 10/26/2019   AST 19 10/26/2019   ALKPHOS 113 10/26/2019   BILITOT <0.2 10/26/2019   Lab Results  Component Value Date   HGBA1C 5.4 10/26/2019   HGBA1C 5.5 06/08/2019   HGBA1C 5.5 12/03/2018   Lab Results  Component Value Date   INSULIN 36.1 (H) 10/26/2019   INSULIN 33.7 (H) 06/08/2019   INSULIN 42.1 (H) 12/03/2018   Lab Results  Component Value Date   TSH 2.150 12/03/2018   Lab Results  Component Value Date   CHOL 151 10/26/2019   HDL 35 (L) 10/26/2019   LDLCALC 75 10/26/2019   TRIG 246 (H) 10/26/2019   Lab Results  Component Value Date   WBC 9.8 12/03/2018   HGB 13.8 12/03/2018   HCT 42.9 12/03/2018   MCV 90 12/03/2018   PLT 445.0 (H) 09/03/2017   No results found for: IRON, TIBC, FERRITIN  Attestation Statements:   Reviewed by clinician on day of visit: allergies, medications, problem list, medical history, surgical history, family history, social history, and previous encounter notes.   I, 11/03/2017, am acting as transcriptionist for Burt Knack, MD.  I have reviewed the above documentation for accuracy and completeness, and I agree with the above. -  Quillian Quince, MD

## 2020-01-23 ENCOUNTER — Other Ambulatory Visit (INDEPENDENT_AMBULATORY_CARE_PROVIDER_SITE_OTHER): Payer: Self-pay | Admitting: Family Medicine

## 2020-01-23 DIAGNOSIS — E8881 Metabolic syndrome: Secondary | ICD-10-CM

## 2020-02-17 ENCOUNTER — Other Ambulatory Visit: Payer: Self-pay

## 2020-02-17 ENCOUNTER — Encounter (INDEPENDENT_AMBULATORY_CARE_PROVIDER_SITE_OTHER): Payer: Self-pay | Admitting: Family Medicine

## 2020-02-17 ENCOUNTER — Ambulatory Visit (INDEPENDENT_AMBULATORY_CARE_PROVIDER_SITE_OTHER): Payer: 59 | Admitting: Family Medicine

## 2020-02-17 VITALS — BP 111/76 | HR 117 | Temp 98.3°F | Ht 69.0 in | Wt 248.0 lb

## 2020-02-17 DIAGNOSIS — F909 Attention-deficit hyperactivity disorder, unspecified type: Secondary | ICD-10-CM | POA: Diagnosis not present

## 2020-02-17 DIAGNOSIS — F3289 Other specified depressive episodes: Secondary | ICD-10-CM

## 2020-02-17 DIAGNOSIS — Z9189 Other specified personal risk factors, not elsewhere classified: Secondary | ICD-10-CM

## 2020-02-17 DIAGNOSIS — E8881 Metabolic syndrome: Secondary | ICD-10-CM | POA: Diagnosis not present

## 2020-02-17 DIAGNOSIS — Z6836 Body mass index (BMI) 36.0-36.9, adult: Secondary | ICD-10-CM

## 2020-02-18 MED ORDER — METFORMIN HCL 500 MG PO TABS: 500.0000 mg | ORAL_TABLET | Freq: Two times a day (BID) | ORAL | 0 refills | Status: DC

## 2020-02-18 MED ORDER — ATOMOXETINE HCL 10 MG PO CAPS: 10.0000 mg | ORAL_CAPSULE | Freq: Every day | ORAL | 0 refills | Status: DC

## 2020-02-18 MED ORDER — ESCITALOPRAM OXALATE 20 MG PO TABS: 20.0000 mg | ORAL_TABLET | Freq: Every day | ORAL | 0 refills | Status: DC

## 2020-02-18 NOTE — Progress Notes (Signed)
Chief Complaint:   OBESITY Novalynn is here to discuss her progress with her obesity treatment plan along with follow-up of her obesity related diagnoses. Kirby is on keeping a food journal and adhering to recommended goals of 1900 calories and 100 grams of protein daily and states she is following her eating plan approximately 70% of the time. Leyton states she is walking for 60 minutes 7 times per week.  Today's visit was #: 24 Starting weight: 257 lbs Starting date: 12/03/2018 Today's weight: 248 lbs Today's date: 02/17/2020 Total lbs lost to date: 9 Total lbs lost since last in-office visit: 0  Interim History: Kerry-Anne believes she is more productive working remotely from home, however she misses the interpersonal interactions in the office. She feels that headaches frequency has decreased and as needed NSAIDS and maxalt will adequately treat symptoms. She feels that she needs a more structured meal and change to Category 3 meal plan.  Subjective:   1. Insulin resistance Kellyn's fasting insulin was 36.1 on 10/26/2019. She is currently on metformin 500 mg BID. She was previously on liraglutide, and tolerated it well and agreeable to restart at low dose.  2. Other depression, with emotional eating Alima reports increase in emotional eating  3. Attention deficit hyperactivity disorder (ADHD) Jovan is currently on atomoxetine 10 mg q daily.  4. At risk for nausea Brinn is at risk for nausea due to chronic migraine with aura.  Assessment/Plan:   1. Insulin resistance Adelle will continue to work on weight loss, exercise, and decreasing simple carbohydrates to help decrease the risk of diabetes.Makhiya agreed to restart liraglutide 0.3 mg q daily, no refill needed, and we will refill metformin for 1 month. Rashanda agreed to follow-up with [place] as directed to closely monitor her progress.  - metFORMIN (GLUCOPHAGE) 500 MG tablet; Take 1 tablet (500 mg total) by  mouth 2 (two) times daily with a meal.  Dispense: 60 tablet; Refill: 0  2. Other depression, with emotional eating Behavior modification techniques were discussed today to help Nirvana deal with her emotional/non-hunger eating behaviors. We will refill escitalopram for 1 month. Orders and follow up as documented in patient record.   - escitalopram (LEXAPRO) 20 MG tablet; Take 1 tablet (20 mg total) by mouth daily.  Dispense: 30 tablet; Refill: 0  3. Attention deficit hyperactivity disorder (ADHD) We will refill atomoxetine for 1 month, and will continue to monitor.  - atomoxetine (STRATTERA) 10 MG capsule; Take 1 capsule (10 mg total) by mouth daily.  Dispense: 30 capsule; Refill: 0  4. At risk for nausea Lajuan Godbee was given approximately 15 minutes of nausea prevention counseling today. Jersee is at risk for nausea due to her new or current medication. She was encouraged to titrate her medication slowly, make sure to stay hydrated, eat smaller portions throughout the day, and avoid high fat meals.   5. Class 2 severe obesity with serious comorbidity and body mass index (BMI) of 36.0 to 36.9 in adult, unspecified obesity type (HCC) Maliaka is currently in the action stage of change. As such, her goal is to continue with weight loss efforts. She has agreed to the Category 3 Plan.   Exercise goals: As is.  Behavioral modification strategies: meal planning and cooking strategies.  Emelee has agreed to follow-up with our clinic in 2 weeks. She was informed of the importance of frequent follow-up visits to maximize her success with intensive lifestyle modifications for her multiple health conditions.   Objective:  Blood pressure 111/76, pulse (!) 117, temperature 98.3 F (36.8 C), temperature source Oral, height 5\' 9"  (1.753 m), weight 248 lb (112.5 kg), SpO2 94 %. Body mass index is 36.62 kg/m.  General: Cooperative, alert, well developed, in no acute distress. HEENT:  Conjunctivae and lids unremarkable. Cardiovascular: Regular rhythm.  Lungs: Normal work of breathing. Neurologic: No focal deficits.   Lab Results  Component Value Date   CREATININE 0.68 10/26/2019   BUN 9 10/26/2019   NA 141 10/26/2019   K 4.7 10/26/2019   CL 106 10/26/2019   CO2 20 10/26/2019   Lab Results  Component Value Date   ALT 17 10/26/2019   AST 19 10/26/2019   ALKPHOS 113 10/26/2019   BILITOT <0.2 10/26/2019   Lab Results  Component Value Date   HGBA1C 5.4 10/26/2019   HGBA1C 5.5 06/08/2019   HGBA1C 5.5 12/03/2018   Lab Results  Component Value Date   INSULIN 36.1 (H) 10/26/2019   INSULIN 33.7 (H) 06/08/2019   INSULIN 42.1 (H) 12/03/2018   Lab Results  Component Value Date   TSH 2.150 12/03/2018   Lab Results  Component Value Date   CHOL 151 10/26/2019   HDL 35 (L) 10/26/2019   LDLCALC 75 10/26/2019   TRIG 246 (H) 10/26/2019   Lab Results  Component Value Date   WBC 9.8 12/03/2018   HGB 13.8 12/03/2018   HCT 42.9 12/03/2018   MCV 90 12/03/2018   PLT 445.0 (H) 09/03/2017   No results found for: IRON, TIBC, FERRITIN  Attestation Statements:   Reviewed by clinician on day of visit: allergies, medications, problem list, medical history, surgical history, family history, social history, and previous encounter notes.   I, Trixie Dredge, am acting as transcriptionist for Dennard Nip, MD.  I have reviewed the above documentation for accuracy and completeness, and I agree with the above. -  Dennard Nip, MD

## 2020-02-19 ENCOUNTER — Other Ambulatory Visit (INDEPENDENT_AMBULATORY_CARE_PROVIDER_SITE_OTHER): Payer: Self-pay | Admitting: Family Medicine

## 2020-02-19 DIAGNOSIS — E8881 Metabolic syndrome: Secondary | ICD-10-CM

## 2020-03-02 ENCOUNTER — Ambulatory Visit (INDEPENDENT_AMBULATORY_CARE_PROVIDER_SITE_OTHER): Payer: 59 | Admitting: Family Medicine

## 2020-03-05 ENCOUNTER — Other Ambulatory Visit (INDEPENDENT_AMBULATORY_CARE_PROVIDER_SITE_OTHER): Payer: Self-pay | Admitting: Family Medicine

## 2020-03-05 DIAGNOSIS — F909 Attention-deficit hyperactivity disorder, unspecified type: Secondary | ICD-10-CM

## 2020-03-16 ENCOUNTER — Ambulatory Visit (INDEPENDENT_AMBULATORY_CARE_PROVIDER_SITE_OTHER): Payer: 59 | Admitting: Family Medicine

## 2020-03-16 ENCOUNTER — Other Ambulatory Visit: Payer: Self-pay

## 2020-03-16 ENCOUNTER — Encounter (INDEPENDENT_AMBULATORY_CARE_PROVIDER_SITE_OTHER): Payer: Self-pay | Admitting: Family Medicine

## 2020-03-16 VITALS — BP 119/81 | HR 118 | Temp 98.4°F | Ht 69.0 in | Wt 245.0 lb

## 2020-03-16 DIAGNOSIS — Z6836 Body mass index (BMI) 36.0-36.9, adult: Secondary | ICD-10-CM

## 2020-03-16 DIAGNOSIS — G4709 Other insomnia: Secondary | ICD-10-CM | POA: Diagnosis not present

## 2020-03-16 DIAGNOSIS — F3289 Other specified depressive episodes: Secondary | ICD-10-CM | POA: Diagnosis not present

## 2020-03-16 DIAGNOSIS — Z9189 Other specified personal risk factors, not elsewhere classified: Secondary | ICD-10-CM | POA: Diagnosis not present

## 2020-03-16 MED ORDER — TRAZODONE HCL 50 MG PO TABS: 25.0000 mg | ORAL_TABLET | Freq: Every day | ORAL | 0 refills | Status: DC

## 2020-03-16 MED ORDER — ESCITALOPRAM OXALATE 20 MG PO TABS: 20.0000 mg | ORAL_TABLET | Freq: Every day | ORAL | 0 refills | Status: DC

## 2020-03-22 NOTE — Progress Notes (Signed)
Chief Complaint:   OBESITY Miranda Hunter is here to discuss her progress with her obesity treatment plan along with follow-up of her obesity related diagnoses. Miranda Hunter is on the Category 3 Plan and states she is following her eating plan approximately 85% of the time. Miranda Hunter states she is walking for 60 minutes 7 times per week.  Today's visit was #: 25 Starting weight: 257 lbs Starting date: 12/03/2018 Today's weight: 245 lbs Today's date: 03/16/2020 Total lbs lost to date: 12 Total lbs lost since last in-office visit: 3  Interim History: Miranda Hunter continues to do well with weight loss. She is traveling to [place] for a wedding soon and has had the [place] vaccine 2 weeks ago. She thinks she will do well making good food choices while she is there.  Subjective:   1. Other insomnia Miranda Hunter is struggling with falling asleep and staying asleep, and she denies improvement with melatonin. She notes restless leg symptoms with benadryl.  2. Other depression, with emotional eating Miranda Hunter's mood is stable on Lexapro. She is doing well with decreasing emotional eating.  3. At risk for heart disease Miranda Hunter is at a higher than average risk for cardiovascular disease due to obesity and insomnia.   Assessment/Plan:   1. Other insomnia The problem of recurrent insomnia was discussed. Orders and follow up as documented in patient record. Counseling: Intensive lifestyle modifications are the first line treatment for this issue. We discussed several lifestyle modifications today and she will continue to work on diet, exercise and weight loss efforts. Miranda Hunter agreed to start Trazodone 50 mg 1/2 tablet qhs with no refills.  - traZODone (DESYREL) 50 MG tablet; Take 0.5 tablets (25 mg total) by mouth at bedtime.  Dispense: 15 tablet; Refill: 0  2. Other depression, with emotional eating Behavior modification techniques were discussed today to help Miranda Hunter deal with her  emotional/non-hunger eating behaviors. We will refill Lexapro for 1 month. Orders and follow up as documented in patient record.   - escitalopram (LEXAPRO) 20 MG tablet; Take 1 tablet (20 mg total) by mouth daily.  Dispense: 30 tablet; Refill: 0  3. At risk for heart disease Miranda Hunter was given approximately 15 minutes of coronary artery disease prevention counseling today. She is 34 y.o. female and has risk factors for heart disease including obesity and insomnia. We discussed intensive lifestyle modifications today with an emphasis on specific weight loss instructions and strategies.   Repetitive spaced learning was employed today to elicit superior memory formation and behavioral change.  4. Class 2 severe obesity with serious comorbidity and body mass index (BMI) of 36.0 to 36.9 in adult, unspecified obesity type (HCC) Miranda Hunter is currently in the action stage of change. As such, her goal is to continue with weight loss efforts. She has agreed to the Category 3 Plan.   Lower calorie drinks were discussed while celebrating.  Exercise goals: As is.  Behavioral modification strategies: travel eating strategies and celebration eating strategies.  Miranda Hunter has agreed to follow-up with our clinic in 3 weeks. She was informed of the importance of frequent follow-up visits to maximize her success with intensive lifestyle modifications for her multiple health conditions.   Objective:   Blood pressure 119/81, pulse (!) 118, temperature 98.4 F (36.9 C), temperature source Oral, height 5\' 9"  (1.753 m), weight 245 lb (111.1 kg), SpO2 96 %. Body mass index is 36.18 kg/m.  General: Cooperative, alert, well developed, in no acute distress. HEENT: Conjunctivae and lids unremarkable. Cardiovascular:  Regular rhythm.  Lungs: Normal work of breathing. Neurologic: No focal deficits.   Lab Results  Component Value Date   CREATININE 0.68 10/26/2019   BUN 9 10/26/2019   NA 141 10/26/2019   K 4.7  10/26/2019   CL 106 10/26/2019   CO2 20 10/26/2019   Lab Results  Component Value Date   ALT 17 10/26/2019   AST 19 10/26/2019   ALKPHOS 113 10/26/2019   BILITOT <0.2 10/26/2019   Lab Results  Component Value Date   HGBA1C 5.4 10/26/2019   HGBA1C 5.5 06/08/2019   HGBA1C 5.5 12/03/2018   Lab Results  Component Value Date   INSULIN 36.1 (H) 10/26/2019   INSULIN 33.7 (H) 06/08/2019   INSULIN 42.1 (H) 12/03/2018   Lab Results  Component Value Date   TSH 2.150 12/03/2018   Lab Results  Component Value Date   CHOL 151 10/26/2019   HDL 35 (L) 10/26/2019   LDLCALC 75 10/26/2019   TRIG 246 (H) 10/26/2019   Lab Results  Component Value Date   WBC 9.8 12/03/2018   HGB 13.8 12/03/2018   HCT 42.9 12/03/2018   MCV 90 12/03/2018   PLT 445.0 (H) 09/03/2017   No results found for: IRON, TIBC, FERRITIN  Attestation Statements:   Reviewed by clinician on day of visit: allergies, medications, problem list, medical history, surgical history, family history, social history, and previous encounter notes.   I, Trixie Dredge, am acting as transcriptionist for Dennard Nip, MD.  I have reviewed the above documentation for accuracy and completeness, and I agree with the above. -  Dennard Nip, MD

## 2020-04-06 ENCOUNTER — Other Ambulatory Visit: Payer: Self-pay

## 2020-04-06 ENCOUNTER — Ambulatory Visit (INDEPENDENT_AMBULATORY_CARE_PROVIDER_SITE_OTHER): Payer: 59 | Admitting: Family Medicine

## 2020-04-06 ENCOUNTER — Encounter (INDEPENDENT_AMBULATORY_CARE_PROVIDER_SITE_OTHER): Payer: Self-pay | Admitting: Family Medicine

## 2020-04-06 VITALS — BP 120/82 | HR 115 | Temp 98.0°F | Ht 69.0 in | Wt 246.0 lb

## 2020-04-06 DIAGNOSIS — E8881 Metabolic syndrome: Secondary | ICD-10-CM | POA: Diagnosis not present

## 2020-04-06 DIAGNOSIS — F909 Attention-deficit hyperactivity disorder, unspecified type: Secondary | ICD-10-CM

## 2020-04-06 DIAGNOSIS — Z9189 Other specified personal risk factors, not elsewhere classified: Secondary | ICD-10-CM

## 2020-04-06 DIAGNOSIS — F3289 Other specified depressive episodes: Secondary | ICD-10-CM

## 2020-04-06 DIAGNOSIS — Z6836 Body mass index (BMI) 36.0-36.9, adult: Secondary | ICD-10-CM

## 2020-04-06 DIAGNOSIS — G4709 Other insomnia: Secondary | ICD-10-CM

## 2020-04-06 MED ORDER — ESCITALOPRAM OXALATE 20 MG PO TABS: 20.0000 mg | ORAL_TABLET | Freq: Every day | ORAL | 0 refills | Status: DC

## 2020-04-06 MED ORDER — TRAZODONE HCL 50 MG PO TABS: 25.0000 mg | ORAL_TABLET | Freq: Every day | ORAL | 0 refills | Status: DC

## 2020-04-06 NOTE — Progress Notes (Signed)
Chief Complaint:   OBESITY Miranda Hunter is here to discuss her progress with her obesity treatment plan along with follow-up of her obesity related diagnoses. Miranda Hunter is on the Category 3 Plan and states she is following her eating plan approximately 85% of the time. Miranda Hunter states she is walking the dog for 60 minutes 7 times per week.  Today's visit was #: 26 Starting weight: 257 lbs Starting date: 12/03/2018 Today's weight: 246 lbs Today's date: 04/06/2020 Total lbs lost to date: 11 Total lbs lost since last in-office visit: 0  Interim History: Miranda Hunter recently traveled to [place] for a wedding and was challenged to remain on the plan due to limited food options, and frequent cocktails. She did however resume consistent meal plan eating the last week. She has several trips and celebrations in the next month. Successful eating strategies were reviewed.  Subjective:   1. Other insomnia Miranda Hunter notes her insomnia is well controlled with full tablet if Trazodone 50 mg qhs. She was unable to fall and remain asleep with 1/2 tablet.  2. Insulin resistance Miranda Hunter's insulin level on 10/26/2019 was 36.1 and A1c was 5.4. she is on metformin 500 mg q daily BID. She reports intermittent nausea without vomiting with afternoon dose. She has not taken her afternoon dose in several weeks.  3. Attention deficit hyperactivity disorder (ADHD) Miranda Hunter has been off Strattera for weeks since she felt the medication was not controlling her symptoms. She has an appointment with her Psychiatrist this Summer for evaluation and hope to resume Adderall (which she was on for years in the past, and tolerated it well).   4. Other depression, with emotional eating Miranda Hunter reports stable mood, and she denies suicidal ideas or homicidal ideas. She reports significant reduction of anxiety levels with SSRI.  5. At risk for side effect of medication Miranda Hunter is at risk for drug side effects due to second  dose of metformin.  Assessment/Plan:   1. Other insomnia The problem of recurrent insomnia was discussed. Orders and follow up as documented in patient record. Counseling: Intensive lifestyle modifications are the first line treatment for this issue. We discussed several lifestyle modifications today and she will continue to work on diet, exercise and weight loss efforts. We will refill trazodone for 1 month.  - traZODone (DESYREL) 50 MG tablet; Take 0.5 tablets (25 mg total) by mouth at bedtime.  Dispense: 15 tablet; Refill: 0  2. Insulin resistance Miranda Hunter will continue to work on weight loss, exercise, and decreasing simple carbohydrates to help decrease the risk of diabetes. Miranda Hunter agreed to continue metformin 500 mg with breakfast, 1/2 tablet with lunch, if nausea continues then stop lunch time dose. Miranda Hunter agreed to follow-up with [place] as directed to closely monitor her progress.  3. Attention deficit hyperactivity disorder (ADHD) Miranda Hunter will follow up with her Psychiatrist as soon as possible, and will continue regular exercise.  4. Other depression, with emotional eating Behavior modification techniques were discussed today to help Miranda Hunter deal with her emotional/non-hunger eating behaviors. We will refill escitalopram for 1 month. Orders and follow up as documented in patient record.   - escitalopram (LEXAPRO) 20 MG tablet; Take 1 tablet (20 mg total) by mouth daily.  Dispense: 30 tablet; Refill: 0  5. At risk for side effect of medication Miranda Hunter was given approximately 15 minutes of drug side effect counseling today. We discussed side effect possibility and risk versus benefits. I recommended reducing lunch dose or eliminating if necessary if nausea continues.  [name] agreed to the medication and will contact this office if these side effects are intolerable.  Repetitive spaced learning was employed today to elicit superior memory formation and behavioral change.  6. Class  2 severe obesity with serious comorbidity and body mass index (BMI) of 36.0 to 36.9 in adult, unspecified obesity type (HCC) Miranda Hunter is currently in the action stage of change. As such, her goal is to continue with weight loss efforts. She has agreed to the Category 3 Plan.   Exercise goals: As is.  Behavioral modification strategies: increasing lean protein intake, decreasing simple carbohydrates, travel eating strategies and celebration eating strategies.  Miranda Hunter has agreed to follow-up with our clinic in 3 to 4 weeks. She was informed of the importance of frequent follow-up visits to maximize her success with intensive lifestyle modifications for her multiple health conditions.   Objective:   Blood pressure 120/82, pulse (!) 115, temperature 98 F (36.7 C), temperature source Oral, height 5\' 9"  (1.753 m), weight 246 lb (111.6 kg), SpO2 96 %. Body mass index is 36.33 kg/m.  General: Cooperative, alert, well developed, in no acute distress. HEENT: Conjunctivae and lids unremarkable. Cardiovascular: Regular rhythm.  Lungs: Normal work of breathing. Neurologic: No focal deficits.   Lab Results  Component Value Date   CREATININE 0.68 10/26/2019   BUN 9 10/26/2019   NA 141 10/26/2019   K 4.7 10/26/2019   CL 106 10/26/2019   CO2 20 10/26/2019   Lab Results  Component Value Date   ALT 17 10/26/2019   AST 19 10/26/2019   ALKPHOS 113 10/26/2019   BILITOT <0.2 10/26/2019   Lab Results  Component Value Date   HGBA1C 5.4 10/26/2019   HGBA1C 5.5 06/08/2019   HGBA1C 5.5 12/03/2018   Lab Results  Component Value Date   INSULIN 36.1 (H) 10/26/2019   INSULIN 33.7 (H) 06/08/2019   INSULIN 42.1 (H) 12/03/2018   Lab Results  Component Value Date   TSH 2.150 12/03/2018   Lab Results  Component Value Date   CHOL 151 10/26/2019   HDL 35 (L) 10/26/2019   LDLCALC 75 10/26/2019   TRIG 246 (H) 10/26/2019   Lab Results  Component Value Date   WBC 9.8 12/03/2018   HGB 13.8  12/03/2018   HCT 42.9 12/03/2018   MCV 90 12/03/2018   PLT 445.0 (H) 09/03/2017   No results found for: IRON, TIBC, FERRITIN  Attestation Statements:   Reviewed by clinician on day of visit: allergies, medications, problem list, medical history, surgical history, family history, social history, and previous encounter notes.   I, Trixie Dredge, am acting as transcriptionist for Dennard Nip, MD.  I have reviewed the above documentation for accuracy and completeness, and I agree with the above. -  Dennard Nip, MD

## 2020-04-10 ENCOUNTER — Encounter (INDEPENDENT_AMBULATORY_CARE_PROVIDER_SITE_OTHER): Payer: Self-pay | Admitting: Family Medicine

## 2020-04-10 DIAGNOSIS — G4709 Other insomnia: Secondary | ICD-10-CM

## 2020-04-11 MED ORDER — TRAZODONE HCL 50 MG PO TABS: 50.0000 mg | ORAL_TABLET | Freq: Every day | ORAL | 0 refills | Status: DC

## 2020-04-11 NOTE — Telephone Encounter (Signed)
Please advise 

## 2020-04-11 NOTE — Telephone Encounter (Signed)
Please address

## 2020-04-27 ENCOUNTER — Encounter: Payer: Self-pay | Admitting: Physician Assistant

## 2020-04-27 ENCOUNTER — Telehealth (INDEPENDENT_AMBULATORY_CARE_PROVIDER_SITE_OTHER): Payer: 59 | Admitting: Physician Assistant

## 2020-04-27 VITALS — Temp 99.9°F | Ht 69.0 in | Wt 240.0 lb

## 2020-04-27 DIAGNOSIS — R0981 Nasal congestion: Secondary | ICD-10-CM | POA: Diagnosis not present

## 2020-04-27 DIAGNOSIS — N870 Mild cervical dysplasia: Secondary | ICD-10-CM | POA: Insufficient documentation

## 2020-04-27 MED ORDER — HYDROCOD POLST-CPM POLST ER 10-8 MG/5ML PO SUER: 5.0000 mL | Freq: Every evening | ORAL | 0 refills | Status: DC | PRN

## 2020-04-27 MED ORDER — DOXYCYCLINE HYCLATE 100 MG PO TABS
100.0000 mg | ORAL_TABLET | Freq: Two times a day (BID) | ORAL | 0 refills | Status: DC
Start: 2020-04-27 — End: 2020-06-06

## 2020-04-27 MED ORDER — ALBUTEROL SULFATE HFA 108 (90 BASE) MCG/ACT IN AERS: 2.0000 | INHALATION_SPRAY | Freq: Four times a day (QID) | RESPIRATORY_TRACT | 2 refills | Status: DC | PRN

## 2020-04-27 NOTE — Progress Notes (Signed)
Virtual Visit via Video   I connected with Miranda Hunter on 04/27/20 at  7:30 AM EDT by a video enabled telemedicine application and verified that I am speaking with the correct person using two identifiers. Location patient: Home Location provider: [facility], Office Persons participating in the virtual visit: Miranda Hunter, [place] PA-C, Miranda Pickler, LPN   I discussed the limitations of evaluation and management by telemedicine and the availability of in person appointments. The patient expressed understanding and agreed to proceed.  I acted as a Education administrator for [employer], PA-C [employer], LPN   Subjective:   HPI:   Sinus problem Pt c/o sinus issues since Sunday, nasal congestion with green nasal drainage, cough and low grade fever. Pt had a virtual visit on Monday was given Prednisone, she is on day 3 and also using Flonase and Tessalon capsule but they are not working.   Pt had COVID testing done yesterday just to be on the safe side. Pt was vaccinated and had COVID in Dec.  Denies: nausea, vomiting, chills  ROS: See pertinent positives and negatives per HPI.  Patient Active Problem List   Diagnosis Date Noted  . Cervical intraepithelial neoplasia grade 1 04/27/2020  . Insulin resistance 10/01/2019  . Depression 04/28/2019  . Class 2 severe obesity with serious comorbidity and body mass index (BMI) of 35.0 to 35.9 in adult [facility]) 04/28/2019  . Migraine with aura and without status migrainosus, not intractable 10/14/2018  . Moderate persistent asthma 09/16/2018    Social History   Tobacco Use  . Smoking status: Never Smoker  . Smokeless tobacco: Never Used  Substance Use Topics  . Alcohol use: Yes    Comment: socially    Current Outpatient Medications:  .  albuterol (VENTOLIN HFA) 108 (90 Base) MCG/ACT inhaler, Inhale 2 puffs into the lungs every 6 (six) hours as needed for wheezing or shortness of breath., Disp: 18 g, Rfl: 2 .  atomoxetine  (STRATTERA) 10 MG capsule, Take 1 capsule (10 mg total) by mouth daily., Disp: 30 capsule, Rfl: 0 .  benzonatate (TESSALON) 100 MG capsule, , Disp: , Rfl:  .  cetirizine (ZYRTEC) 10 MG tablet, Take 10 mg by mouth daily., Disp: , Rfl:  .  escitalopram (LEXAPRO) 20 MG tablet, Take 1 tablet (20 mg total) by mouth daily., Disp: 30 tablet, Rfl: 0 .  Flaxseed, Linseed, (FLAX SEEDS PO), Take 1 tablet by mouth at bedtime., Disp: , Rfl:  .  fluticasone (FLONASE) 50 MCG/ACT nasal spray, , Disp: , Rfl:  .  ketorolac (TORADOL) 10 MG tablet, Take 1 tablet (10 mg total) by mouth every 6 (six) hours as needed., Disp: 20 tablet, Rfl: 11 .  Levonorgestrel (KYLEENA IU), by Intrauterine route., Disp: , Rfl:  .  metFORMIN (GLUCOPHAGE) 500 MG tablet, Take 1 tablet (500 mg total) by mouth 2 (two) times daily with a meal., Disp: 60 tablet, Rfl: 0 .  Multiple Vitamin (MULTIVITAMIN) tablet, Take 1 tablet by mouth daily., Disp: , Rfl:  .  ondansetron (ZOFRAN-ODT) 4 MG disintegrating tablet, Take 1-2 tablets (4-8 mg total) by mouth every 8 (eight) hours as needed for nausea., Disp: 30 tablet, Rfl: 11 .  predniSONE (DELTASONE) 20 MG tablet, Take 20 mg by mouth 2 (two) times daily., Disp: , Rfl:  .  promethazine (PHENERGAN) 25 MG tablet, Take 1 tablet (25 mg total) by mouth every 6 (six) hours as needed for nausea or vomiting., Disp: 30 tablet, Rfl: 6 .  rizatriptan (MAXALT-MLT)  10 MG disintegrating tablet, Take 1 tablet (10 mg total) by mouth as needed for migraine. May repeat in 2 hours if needed, Disp: 9 tablet, Rfl: 11 .  traZODone (DESYREL) 50 MG tablet, Take 1 tablet (50 mg total) by mouth at bedtime., Disp: 30 tablet, Rfl: 0 .  chlorpheniramine-HYDROcodone (TUSSIONEX PENNKINETIC ER) 10-8 MG/5ML SUER, Take 5 mLs by mouth at bedtime as needed for cough., Disp: 60 mL, Rfl: 0 .  doxycycline (VIBRA-TABS) 100 MG tablet, Take 1 tablet (100 mg total) by mouth 2 (two) times daily., Disp: 20 tablet, Rfl: 0  Allergies  Allergen  Reactions  . Amoxicillin-Pot Clavulanate Diarrhea    Objective:   VITALS: Per patient if applicable, see vitals. GENERAL: Alert, appears well and in no acute distress. HEENT: Atraumatic, conjunctiva clear, no obvious abnormalities on inspection of external nose and ears. NECK: Normal movements of the head and neck. CARDIOPULMONARY: No increased WOB. Speaking in clear sentences. I:E ratio WNL.  MS: Moves all visible extremities without noticeable abnormality. PSYCH: Pleasant and cooperative, well-groomed. Speech normal rate and rhythm. Affect is appropriate. Insight and judgement are appropriate. Attention is focused, linear, and appropriate.  NEURO: CN grossly intact. Oriented as arrived to appointment on time with no prompting. Moves both UE equally.  SKIN: No obvious lesions, wounds, erythema, or cyanosis noted on face or hands.  Assessment and Plan:   [name] was seen today for sinus problem.  Diagnoses and all orders for this visit:  Sinus congestion No red flags on discussion. Suspect sinus infection.  Will initiate oral doxycycline, tussionex, albuterol inhaler per orders. Encouraged use of delsym during day. Discussed taking medications as prescribed. Reviewed return precautions including worsening fever, SOB, worsening cough or other concerns. Push fluids and rest. I recommend that patient follow-up if symptoms worsen or persist despite treatment x 7-10 days, sooner if needed.  Other orders -     albuterol (VENTOLIN HFA) 108 (90 Base) MCG/ACT inhaler; Inhale 2 puffs into the lungs every 6 (six) hours as needed for wheezing or shortness of breath. -     doxycycline (VIBRA-TABS) 100 MG tablet; Take 1 tablet (100 mg total) by mouth 2 (two) times daily. -     chlorpheniramine-HYDROcodone (TUSSIONEX PENNKINETIC ER) 10-8 MG/5ML SUER; Take 5 mLs by mouth at bedtime as needed for cough.    . Reviewed expectations re: course of current medical issues. . Discussed self-management of  symptoms. . Outlined signs and symptoms indicating need for more acute intervention. . Patient verbalized understanding and all questions were answered. Marland Kitchen Health Maintenance issues including appropriate healthy diet, exercise, and smoking avoidance were discussed with patient. . See orders for this visit as documented in the electronic medical record.  I discussed the assessment and treatment plan with the patient. The patient was provided an opportunity to ask questions and all were answered. The patient agreed with the plan and demonstrated an understanding of the instructions.   The patient was advised to call back or seek an in-person evaluation if the symptoms worsen or if the condition fails to improve as anticipated.   CMA or LPN served as scribe during this visit. History, Physical, and Plan performed by medical provider. The above documentation has been reviewed and is accurate and complete.  [place], [place] 04/27/2020

## 2020-05-04 ENCOUNTER — Encounter (INDEPENDENT_AMBULATORY_CARE_PROVIDER_SITE_OTHER): Payer: Self-pay | Admitting: Family Medicine

## 2020-05-04 ENCOUNTER — Other Ambulatory Visit: Payer: Self-pay

## 2020-05-04 ENCOUNTER — Ambulatory Visit (INDEPENDENT_AMBULATORY_CARE_PROVIDER_SITE_OTHER): Payer: 59 | Admitting: Family Medicine

## 2020-05-04 VITALS — BP 122/70 | HR 103 | Temp 98.1°F | Ht 69.0 in | Wt 243.0 lb

## 2020-05-04 DIAGNOSIS — E538 Deficiency of other specified B group vitamins: Secondary | ICD-10-CM

## 2020-05-04 DIAGNOSIS — E559 Vitamin D deficiency, unspecified: Secondary | ICD-10-CM

## 2020-05-04 DIAGNOSIS — Z9189 Other specified personal risk factors, not elsewhere classified: Secondary | ICD-10-CM

## 2020-05-04 DIAGNOSIS — Z6836 Body mass index (BMI) 36.0-36.9, adult: Secondary | ICD-10-CM

## 2020-05-04 DIAGNOSIS — G4709 Other insomnia: Secondary | ICD-10-CM | POA: Diagnosis not present

## 2020-05-04 DIAGNOSIS — E8881 Metabolic syndrome: Secondary | ICD-10-CM

## 2020-05-04 MED ORDER — TRAZODONE HCL 50 MG PO TABS: 50.0000 mg | ORAL_TABLET | Freq: Every day | ORAL | 0 refills | Status: DC

## 2020-05-04 MED ORDER — METFORMIN HCL 500 MG PO TABS: 500.0000 mg | ORAL_TABLET | Freq: Two times a day (BID) | ORAL | 0 refills | Status: DC

## 2020-05-04 NOTE — Progress Notes (Signed)
Chief Complaint:   OBESITY Miranda Hunter is here to discuss her progress with her obesity treatment plan along with follow-up of her obesity related diagnoses. Miranda Hunter is on the Category 3 Plan and states she is following her eating plan approximately 0% of the time. Miranda Hunter states she is walking for 60 minutes 7 times per week.  Today's visit was #: 10 Starting weight: 257 lbs Starting date: 12/03/2018 Today's weight: 243 lbs Today's date: 05/04/2020 Total lbs lost to date: 14 Total lbs lost since last in-office visit: 3  Interim History: Miranda Hunter was on vacation for 1 week recently. She was at her friend's bachelorette party as well. She is getting protein in as much as possible.  Subjective:   1. Other insomnia Miranda Hunter notes insomnia and she is on trazodone.  2. Vitamin B 12 deficiency Miranda Hunter has a diagnosis of Vit B12 deficiency secondary to metformin use.  3. Vitamin D deficiency Miranda Hunter's last Vit D level was 41.4.  4. Insulin resistance Miranda Hunter is on metformin and she is taking it consistently.  5. At risk for side effect of medication Miranda Hunter is at risk for drug side effects due to B12 and magnesium deficiency secondary to metformin use.  Assessment/Plan:   1. Other insomnia The problem of recurrent insomnia was discussed. Orders and follow up as documented in patient record. Counseling: Intensive lifestyle modifications are the first line treatment for this issue. We discussed several lifestyle modifications today. Miranda Hunter will continue to work on diet, exercise and weight loss efforts. We will refill trazodone for 1 month.  - Comprehensive metabolic panel - Lipid Panel With LDL/HDL Ratio - VITAMIN D 25 Hydroxy (Vit-D Deficiency, Fractures) - Vitamin B12 - T3 - T4, free - TSH - traZODone (DESYREL) 50 MG tablet; Take 1 tablet (50 mg total) by mouth at bedtime.  Dispense: 30 tablet; Refill: 0  2. Vitamin B 12 deficiency The diagnosis was reviewed with the  patient. We will continue to monitor. We will check labs today. Orders and follow up as documented in patient record.  - Comprehensive metabolic panel - Lipid Panel With LDL/HDL Ratio - Vitamin B12 - T3 - T4, free - TSH  3. Vitamin D deficiency Low Vitamin D level contributes to fatigue and are associated with obesity, breast, and colon cancer. We will check labs today. Miranda Hunter will follow-up for routine testing of Vitamin D, at least 2-3 times per year to avoid over-replacement.  - VITAMIN D 25 Hydroxy (Vit-D Deficiency, Fractures)  4. Insulin resistance Miranda Hunter will continue her medications, and will continue to work on weight loss, diet, exercise, and decreasing simple carbohydrates to help decrease the risk of diabetes. We will refill metformin for 1 month. Miranda Hunter agreed to follow-up with [place] as directed to closely monitor her progress.  - Comprehensive metabolic panel - Hemoglobin A1c - Insulin, random - Lipid Panel With LDL/HDL Ratio - T3 - T4, free - TSH - metFORMIN (GLUCOPHAGE) 500 MG tablet; Take 1 tablet (500 mg total) by mouth 2 (two) times daily with a meal.  Dispense: 60 tablet; Refill: 0  5. At risk for side effect of medication Miranda Hunter was given approximately 15 minutes of drug side effect counseling today.  We discussed side effect possibility and risk versus benefits. Miranda Hunter agreed to the medication and will contact this office if these side effects are intolerable.  Repetitive spaced learning was employed today to elicit superior memory formation and behavioral change.  6. Class 2 severe obesity with serious comorbidity and  body mass index (BMI) of 36.0 to 36.9 in adult, unspecified obesity type (HCC) Miranda Hunter is currently in the action stage of change. As such, her goal is to continue with weight loss efforts. She has agreed to the Category 3 Plan.   Exercise goals: As is.  Behavioral modification strategies: ways to avoid boredom eating, better snacking  choices and celebration eating strategies.  Miranda Hunter has agreed to follow-up with our clinic in 4 weeks. She was informed of the importance of frequent follow-up visits to maximize her success with intensive lifestyle modifications for her multiple health conditions.   Miranda Hunter was informed we would discuss her lab results at her next visit unless there is a critical issue that needs to be addressed sooner. Miranda Hunter agreed to keep her next visit at the agreed upon time to discuss these results.  Objective:   Blood pressure 122/70, pulse (!) 103, temperature 98.1 F (36.7 C), temperature source Oral, height 5\' 9"  (1.753 m), weight 243 lb (110.2 kg), last menstrual period 04/25/2020, SpO2 96 %. Body mass index is 35.88 kg/m.  General: Cooperative, alert, well developed, in no acute distress. HEENT: Conjunctivae and lids unremarkable. Cardiovascular: Regular rhythm.  Lungs: Normal work of breathing. Neurologic: No focal deficits.   Lab Results  Component Value Date   CREATININE 0.68 10/26/2019   BUN 9 10/26/2019   NA 141 10/26/2019   K 4.7 10/26/2019   CL 106 10/26/2019   CO2 20 10/26/2019   Lab Results  Component Value Date   ALT 17 10/26/2019   AST 19 10/26/2019   ALKPHOS 113 10/26/2019   BILITOT <0.2 10/26/2019   Lab Results  Component Value Date   HGBA1C 5.4 10/26/2019   HGBA1C 5.5 06/08/2019   HGBA1C 5.5 12/03/2018   Lab Results  Component Value Date   INSULIN 36.1 (H) 10/26/2019   INSULIN 33.7 (H) 06/08/2019   INSULIN 42.1 (H) 12/03/2018   Lab Results  Component Value Date   TSH 2.150 12/03/2018   Lab Results  Component Value Date   CHOL 151 10/26/2019   HDL 35 (L) 10/26/2019   LDLCALC 75 10/26/2019   TRIG 246 (H) 10/26/2019   Lab Results  Component Value Date   WBC 9.8 12/03/2018   HGB 13.8 12/03/2018   HCT 42.9 12/03/2018   MCV 90 12/03/2018   PLT 445.0 (H) 09/03/2017   No results found for: IRON, TIBC, FERRITIN  Attestation Statements:    Reviewed by clinician on day of visit: allergies, medications, problem list, medical history, surgical history, family history, social history, and previous encounter notes.   I, 11/03/2017, am acting as transcriptionist for Burt Knack, MD.  I have reviewed the above documentation for accuracy and completeness, and I agree with the above. -  Quillian Quince, MD

## 2020-05-05 LAB — VITAMIN B12: Vitamin B-12: 867 pg/mL (ref 232–1245)

## 2020-05-05 LAB — COMPREHENSIVE METABOLIC PANEL
ALT: 38 IU/L — ABNORMAL HIGH (ref 0–32)
AST: 23 IU/L (ref 0–40)
Albumin/Globulin Ratio: 2.1 (ref 1.2–2.2)
Albumin: 4.7 g/dL (ref 3.8–4.8)
Alkaline Phosphatase: 112 IU/L (ref 48–121)
BUN/Creatinine Ratio: 13 (ref 9–23)
BUN: 8 mg/dL (ref 6–20)
Bilirubin Total: 0.3 mg/dL (ref 0.0–1.2)
CO2: 20 mmol/L (ref 20–29)
Calcium: 10 mg/dL (ref 8.7–10.2)
Chloride: 101 mmol/L (ref 96–106)
Creatinine, Ser: 0.64 mg/dL (ref 0.57–1.00)
GFR calc Af Amer: 138 mL/min/{1.73_m2} (ref >59)
GFR calc non Af Amer: 119 mL/min/{1.73_m2} (ref >59)
Globulin, Total: 2.2 g/dL (ref 1.5–4.5)
Glucose: 101 mg/dL — ABNORMAL HIGH (ref 65–99)
Potassium: 4.5 mmol/L (ref 3.5–5.2)
Sodium: 135 mmol/L (ref 134–144)
Total Protein: 6.9 g/dL (ref 6.0–8.5)

## 2020-05-05 LAB — LIPID PANEL WITH LDL/HDL RATIO
Cholesterol, Total: 198 mg/dL (ref 100–199)
HDL: 37 mg/dL — ABNORMAL LOW (ref >39)
LDL Chol Calc (NIH): 112 mg/dL — ABNORMAL HIGH (ref 0–99)
LDL/HDL Ratio: 3 ratio (ref 0.0–3.2)
Triglycerides: 280 mg/dL — ABNORMAL HIGH (ref 0–149)
VLDL Cholesterol Cal: 49 mg/dL — ABNORMAL HIGH (ref 5–40)

## 2020-05-05 LAB — VITAMIN D 25 HYDROXY (VIT D DEFICIENCY, FRACTURES): Vit D, 25-Hydroxy: 46.1 ng/mL (ref 30.0–100.0)

## 2020-05-05 LAB — T4, FREE: Free T4: 1.38 ng/dL (ref 0.82–1.77)

## 2020-05-05 LAB — HEMOGLOBIN A1C
Est. average glucose Bld gHb Est-mCnc: 117 mg/dL
Hgb A1c MFr Bld: 5.7 % — ABNORMAL HIGH (ref 4.8–5.6)

## 2020-05-05 LAB — T3: T3, Total: 115 ng/dL (ref 71–180)

## 2020-05-05 LAB — INSULIN, RANDOM: INSULIN: 31 u[IU]/mL — ABNORMAL HIGH (ref 2.6–24.9)

## 2020-05-05 LAB — MAGNESIUM: Magnesium: 2.2 mg/dL (ref 1.6–2.3)

## 2020-05-05 LAB — TSH: TSH: 1.26 u[IU]/mL (ref 0.450–4.500)

## 2020-05-18 ENCOUNTER — Other Ambulatory Visit (INDEPENDENT_AMBULATORY_CARE_PROVIDER_SITE_OTHER): Payer: Self-pay | Admitting: Family Medicine

## 2020-05-18 DIAGNOSIS — F3289 Other specified depressive episodes: Secondary | ICD-10-CM

## 2020-05-25 ENCOUNTER — Other Ambulatory Visit (INDEPENDENT_AMBULATORY_CARE_PROVIDER_SITE_OTHER): Payer: Self-pay | Admitting: Family Medicine

## 2020-05-25 DIAGNOSIS — G4709 Other insomnia: Secondary | ICD-10-CM

## 2020-06-01 ENCOUNTER — Ambulatory Visit (INDEPENDENT_AMBULATORY_CARE_PROVIDER_SITE_OTHER): Payer: 59 | Admitting: Family Medicine

## 2020-06-04 ENCOUNTER — Other Ambulatory Visit (INDEPENDENT_AMBULATORY_CARE_PROVIDER_SITE_OTHER): Payer: Self-pay | Admitting: Family Medicine

## 2020-06-04 DIAGNOSIS — E8881 Metabolic syndrome: Secondary | ICD-10-CM

## 2020-06-04 DIAGNOSIS — G4709 Other insomnia: Secondary | ICD-10-CM

## 2020-06-06 ENCOUNTER — Encounter (INDEPENDENT_AMBULATORY_CARE_PROVIDER_SITE_OTHER): Payer: Self-pay | Admitting: Adult Health

## 2020-06-06 ENCOUNTER — Ambulatory Visit (INDEPENDENT_AMBULATORY_CARE_PROVIDER_SITE_OTHER): Payer: 59 | Admitting: Adult Health

## 2020-06-06 ENCOUNTER — Other Ambulatory Visit: Payer: Self-pay

## 2020-06-06 VITALS — BP 113/79 | HR 105 | Temp 97.8°F | Ht 69.0 in | Wt 241.0 lb

## 2020-06-06 DIAGNOSIS — R7303 Prediabetes: Secondary | ICD-10-CM

## 2020-06-06 DIAGNOSIS — Z9189 Other specified personal risk factors, not elsewhere classified: Secondary | ICD-10-CM | POA: Diagnosis not present

## 2020-06-06 DIAGNOSIS — E66812 Obesity, class 2: Secondary | ICD-10-CM

## 2020-06-06 DIAGNOSIS — F3289 Other specified depressive episodes: Secondary | ICD-10-CM

## 2020-06-06 DIAGNOSIS — R7401 Elevation of levels of liver transaminase levels: Secondary | ICD-10-CM | POA: Diagnosis not present

## 2020-06-06 DIAGNOSIS — E7849 Other hyperlipidemia: Secondary | ICD-10-CM

## 2020-06-06 DIAGNOSIS — E782 Mixed hyperlipidemia: Secondary | ICD-10-CM | POA: Insufficient documentation

## 2020-06-06 DIAGNOSIS — G4709 Other insomnia: Secondary | ICD-10-CM | POA: Diagnosis not present

## 2020-06-06 DIAGNOSIS — Z6835 Body mass index (BMI) 35.0-35.9, adult: Secondary | ICD-10-CM

## 2020-06-06 MED ORDER — TRAZODONE HCL 50 MG PO TABS: 50.0000 mg | ORAL_TABLET | Freq: Every day | ORAL | 0 refills | Status: DC

## 2020-06-06 MED ORDER — ESCITALOPRAM OXALATE 20 MG PO TABS: 20.0000 mg | ORAL_TABLET | Freq: Every day | ORAL | 0 refills | Status: DC

## 2020-06-06 NOTE — Progress Notes (Addendum)
Chief Complaint:   OBESITY Miranda Hunter is here to discuss her progress with her obesity treatment plan along with follow-up of her obesity related diagnoses. Miranda Hunter is on the Category 3 Plan and states she is following her eating plan approximately 60-70% of the time. Miranda Hunter states she is walking 60 minutes 7 times per week.  Today's visit was #: 28 Starting weight: 257 lbs Starting date: 12/03/2018 Today's weight: 241 lbs Today's date: 06/06/2020 Total lbs lost to date: 16 Total lbs lost since last in-office visit: 2  Interim History: Miranda Hunter has been traveling frequently to the beach with her parents and to celebrate a bachelorette party. She has been focusing on protein/vegetables during travel as well as walking frequently to stay on track as much as possible. She will be traveling to [place] this week.  Subjective:   Prediabetes. Miranda Hunter has a diagnosis of prediabetes based on her elevated HgA1c and was informed this puts her at greater risk of developing diabetes. She continues to work on diet and exercise to decrease her risk of diabetes. She denies nausea or hypoglycemia. A1c and blood glucose levels are elevated from last check, however, insulin level is slightly improved. Miranda Hunter is currently taking metformin 500 mg BID and tolerating it well. She will often forget the second metformin dose. Reviewed recent labs with patient today.  Lab Results  Component Value Date   HGBA1C 5.7 (H) 05/04/2020   Lab Results  Component Value Date   INSULIN 31.0 (H) 05/04/2020   INSULIN 36.1 (H) 10/26/2019   INSULIN 33.7 (H) 06/08/2019   INSULIN 42.1 (H) 12/03/2018   Other depression, with emotional eating. Miranda Hunter is struggling with emotional eating and using food for comfort to the extent that it is negatively impacting her health. She has been working on behavior modification techniques to help reduce her emotional eating and has been somewhat successful. She shows no sign of  suicidal or homicidal ideations. Miranda Hunter reports stable mood. She is on escitalopram 20 mg daily.  Other insomnia. Miranda Hunter reports difficulty falling asleep and has been using Trazodone 50 mg daily. She denies oversedation in the a.m.  Other hyperlipidemia. Miranda Hunter has hyperlipidemia and has been trying to improve her cholesterol levels with intensive lifestyle modification including a low saturated fat diet, exercise and weight loss. She denies any chest pain, claudication or myalgias. LDL and triglycerides are above goal. She denies tobacco/vape use and denies a first degree family history of MI/CVA. Reviewed recent labs with patient today.  Lab Results  Component Value Date   ALT 38 (H) 05/04/2020   AST 23 05/04/2020   ALKPHOS 112 05/04/2020   BILITOT 0.3 05/04/2020   Lab Results  Component Value Date   CHOL 198 05/04/2020   HDL 37 (L) 05/04/2020   LDLCALC 112 (H) 05/04/2020   TRIG 280 (H) 05/04/2020   Transaminitis. Miranda Hunter denies right upper quadrant pain and denies excessive acetaminophen use. She will occasionally have ETOH. Reviewed recent labs with patient today.  At risk for heart disease. Miranda Hunter is at a higher than average risk for cardiovascular disease due to hyperlipidemia, obesity, and prediabetes.   Assessment/Plan:   Prediabetes. Miranda Hunter will continue to work on weight loss, exercise, and decreasing simple carbohydrates to help decrease the risk of diabetes. We recommend she set an alarm to remember to take metformin BID (no RF needed today). Labs will be checked every 3 months.  Other depression, with emotional eating. Behavior modification techniques were discussed today to help [name]  deal with her emotional/non-hunger eating behaviors.  Orders and follow up as documented in patient record. Refill was given for escitalopram (LEXAPRO) 20 MG tablet #30 with 0 refills.  Other insomnia. The problem of recurrent insomnia was discussed. Orders and follow up as  documented in patient record. Counseling: Intensive lifestyle modifications are the first line treatment for this issue. We discussed several lifestyle modifications today and she will continue to work on diet, exercise and weight loss efforts. Refill was given for traZODone (DESYREL) 50 MG tablet #30 with 0 refills. She was advised to practice good sleep hygiene.  Counseling  Limit or avoid alcohol, caffeinated beverages, and cigarettes, especially close to bedtime.   Do not eat a large meal or eat spicy foods right before bedtime. This can lead to digestive discomfort that can make it hard for you to sleep.  Keep a sleep diary to help you and your health care provider figure out what could be causing your insomnia.  . Make your bedroom a dark, comfortable place where it is easy to fall asleep. ? Put up shades or blackout curtains to block light from outside. ? Use a white noise machine to block noise. ? Keep the temperature cool. . Limit screen use before bedtime. This includes: ? Watching TV. ? Using your smartphone, tablet, or computer. . Stick to a routine that includes going to bed and waking up at the same times every day and night. This can help you fall asleep faster. Consider making a quiet activity, such as reading, part of your nighttime routine. . Try to avoid taking naps during the day so that you sleep better at night. . Get out of bed if you are still awake after 15 minutes of trying to sleep. Keep the lights down, but try reading or doing a quiet activity. When you feel sleepy, go back to bed.  Other hyperlipidemia. Cardiovascular risk and specific lipid/LDL goals reviewed.  We discussed several lifestyle modifications today and Miranda Hunter will continue to work on diet, exercise and weight loss efforts. Orders and follow up as documented in patient record. She will continue to follow the Category 3 meal plan. Labs will be checked every 3 months.  Counseling Intensive lifestyle  modifications are the first line treatment for this issue. . Dietary changes: Increase soluble fiber. Decrease simple carbohydrates. . Exercise changes: Moderate to vigorous-intensity aerobic activity 150 minutes per week if tolerated. . Lipid-lowering medications: see documented in medical record.  Transaminitis. Miranda Hunter will continue to follow the Category 3 meal plan and will keep ETOH use to a minimum. Will check labs every 3 months.  At risk for heart disease. Miranda Hunter was given approximately 15 minutes of coronary artery disease prevention counseling today. She is 34 y.o. female and has risk factors for heart disease including obesity. We discussed intensive lifestyle modifications today with an emphasis on specific weight loss instructions and strategies.   Repetitive spaced learning was employed today to elicit superior memory formation and behavioral change.  Class 2 severe obesity with serious comorbidity and body mass index (BMI) of 35.0 to 35.9 in adult, unspecified obesity type (HCC).  Miranda Hunter is currently in the action stage of change. As such, her goal is to continue with weight loss efforts. She has agreed to the Category 3 Plan.   Exercise goals: Miranda Hunter will continue her current exercise regimen.   Behavioral modification strategies: increasing lean protein intake, increasing water intake, meal planning and cooking strategies, travel eating strategies and celebration eating strategies.  Miranda Hunter has agreed to follow-up with our clinic in 4 weeks. She was informed of the importance of frequent follow-up visits to maximize her success with intensive lifestyle modifications for her multiple health conditions.   Objective:   Blood pressure 113/79, pulse (!) 105, temperature 97.8 F (36.6 C), temperature source Oral, height 5\' 9"  (1.753 m), weight 241 lb (109.3 kg), SpO2 96 %. Body mass index is 35.59 kg/m.  General: Cooperative, alert, well developed, in no acute  distress. HEENT: Conjunctivae and lids unremarkable. Cardiovascular: Regular rhythm.  Lungs: Normal work of breathing. Neurologic: No focal deficits.   Lab Results  Component Value Date   CREATININE 0.64 05/04/2020   BUN 8 05/04/2020   NA 135 05/04/2020   K 4.5 05/04/2020   CL 101 05/04/2020   CO2 20 05/04/2020   Lab Results  Component Value Date   ALT 38 (H) 05/04/2020   AST 23 05/04/2020   ALKPHOS 112 05/04/2020   BILITOT 0.3 05/04/2020   Lab Results  Component Value Date   HGBA1C 5.7 (H) 05/04/2020   HGBA1C 5.4 10/26/2019   HGBA1C 5.5 06/08/2019   HGBA1C 5.5 12/03/2018   Lab Results  Component Value Date   INSULIN 31.0 (H) 05/04/2020   INSULIN 36.1 (H) 10/26/2019   INSULIN 33.7 (H) 06/08/2019   INSULIN 42.1 (H) 12/03/2018   Lab Results  Component Value Date   TSH 1.260 05/04/2020   Lab Results  Component Value Date   CHOL 198 05/04/2020   HDL 37 (L) 05/04/2020   LDLCALC 112 (H) 05/04/2020   TRIG 280 (H) 05/04/2020   Lab Results  Component Value Date   WBC 9.8 12/03/2018   HGB 13.8 12/03/2018   HCT 42.9 12/03/2018   MCV 90 12/03/2018   PLT 445.0 (H) 09/03/2017   No results found for: IRON, TIBC, FERRITIN  Attestation Statements:   Reviewed by clinician on day of visit: allergies, medications, problem list, medical history, surgical history, family history, social history, and previous encounter notes.  I, 11/03/2017, am acting as Marianna Payment for Energy manager, NP-C   I have reviewed the above documentation for accuracy and completeness, and I agree with the above. -  [employer], NP

## 2020-07-03 ENCOUNTER — Other Ambulatory Visit (INDEPENDENT_AMBULATORY_CARE_PROVIDER_SITE_OTHER): Payer: Self-pay | Admitting: Adult Health

## 2020-07-03 DIAGNOSIS — F3289 Other specified depressive episodes: Secondary | ICD-10-CM

## 2020-07-04 ENCOUNTER — Ambulatory Visit (INDEPENDENT_AMBULATORY_CARE_PROVIDER_SITE_OTHER): Payer: 59 | Admitting: Adult Health

## 2020-07-11 ENCOUNTER — Ambulatory Visit (INDEPENDENT_AMBULATORY_CARE_PROVIDER_SITE_OTHER): Payer: 59 | Admitting: Adult Health

## 2020-07-11 ENCOUNTER — Encounter (INDEPENDENT_AMBULATORY_CARE_PROVIDER_SITE_OTHER): Payer: Self-pay | Admitting: Adult Health

## 2020-07-11 ENCOUNTER — Other Ambulatory Visit: Payer: Self-pay

## 2020-07-11 VITALS — BP 126/85 | HR 94 | Temp 98.0°F | Ht 69.0 in | Wt 245.0 lb

## 2020-07-11 DIAGNOSIS — E8881 Metabolic syndrome: Secondary | ICD-10-CM | POA: Diagnosis not present

## 2020-07-11 DIAGNOSIS — E66812 Obesity, class 2: Secondary | ICD-10-CM

## 2020-07-11 DIAGNOSIS — G4709 Other insomnia: Secondary | ICD-10-CM | POA: Diagnosis not present

## 2020-07-11 DIAGNOSIS — Z6836 Body mass index (BMI) 36.0-36.9, adult: Secondary | ICD-10-CM

## 2020-07-11 DIAGNOSIS — F3289 Other specified depressive episodes: Secondary | ICD-10-CM | POA: Diagnosis not present

## 2020-07-11 DIAGNOSIS — E88819 Insulin resistance, unspecified: Secondary | ICD-10-CM

## 2020-07-11 DIAGNOSIS — F418 Other specified anxiety disorders: Secondary | ICD-10-CM

## 2020-07-11 DIAGNOSIS — Z9189 Other specified personal risk factors, not elsewhere classified: Secondary | ICD-10-CM | POA: Diagnosis not present

## 2020-07-11 MED ORDER — ESCITALOPRAM OXALATE 20 MG PO TABS: 20.0000 mg | ORAL_TABLET | Freq: Every day | ORAL | 0 refills | Status: DC

## 2020-07-11 MED ORDER — TRAZODONE HCL 50 MG PO TABS: 50.0000 mg | ORAL_TABLET | Freq: Every day | ORAL | 0 refills | Status: DC

## 2020-07-12 NOTE — Progress Notes (Signed)
Chief Complaint:   OBESITY Miranda Hunter is here to discuss her progress with her obesity treatment plan along with follow-up of her obesity related diagnoses. Miranda Hunter is on the Category 3 Plan and states she is following her eating plan approximately 80% of the time. Miranda Hunter states she is walking 30 minutes 7 times per week.  Today's visit was #: 29 Starting weight: 257 lbs Starting date: 12/03/2018 Today's weight: 245 lbs Today's date: 07/11/2020 Total lbs lost to date: 12 Total lbs lost since last in-office visit: 0  Interim History: Miranda Hunter's paternal grandparents are experiencing failing health - both are 49. Her grandmother was moved to Hospice and her grandfather continues to live independently in their retirement community. She reports increased stress/anxiety with visiting/caring for her grandparents. Her sister lives in [place] and her parents recently returned to the beach, so the majority of care responsibilities falls to Miranda Hunter. She has been trying to stay on plan as best she can.  Subjective:   Insulin resistance. Miranda Hunter has a diagnosis of insulin resistance based on her elevated fasting insulin level >5. She continues to work on diet and exercise to decrease her risk of diabetes. Miranda Hunter is on metformin 500 mg BID and denies GI upset.  Lab Results  Component Value Date   INSULIN 31.0 (H) 05/04/2020   INSULIN 36.1 (H) 10/26/2019   INSULIN 33.7 (H) 06/08/2019   INSULIN 42.1 (H) 12/03/2018   Lab Results  Component Value Date   HGBA1C 5.7 (H) 05/04/2020   Other depression, with emotional eating. Miranda Hunter is struggling with emotional eating and using food for comfort to the extent that it is negatively impacting her health. She has been working on behavior modification techniques to help reduce her emotional eating and has been somewhat successful. She shows no sign of suicidal or homicidal ideations. Miranda Hunter reports increased stress/anxiety related to failing  health of paternal grandparents. She is on escitalopram 20 mg daily.  Other insomnia. Miranda Hunter is using Miranda Hunter 50 mg every night to be able to fall and remain asleep. She denies oversedation the following morning.  At risk for diarrhea. Miranda Hunter is at higher risk of diarrhea due to insulin resistance with metformin use.  Assessment/Plan:   Insulin resistance. Miranda Hunter will continue to work on weight loss, exercise, and decreasing simple carbohydrates to help decrease the risk of diabetes. Shera agreed to follow-up with [place] as directed to closely monitor her progress. She will continue metformin as directed. Labs will be checked every 3 months.  Other depression, with emotional eating. Behavior modification techniques were discussed today to help Miranda Hunter deal with her emotional/non-hunger eating behaviors.  Orders and follow up as documented in patient record. Refill was given for escitalopram (Miranda Hunter) 20 MG tablet daily #30 with 0 refills. Ambulatory referral to Psychology.  Other insomnia. The problem of recurrent insomnia was discussed. Orders and follow up as documented in patient record. Counseling: Intensive lifestyle modifications are the first line treatment for this issue. We discussed several lifestyle modifications today and she will continue to work on diet, exercise and weight loss efforts. Refill was given for Miranda Hunter (DESYREL) 50 MG tablet QHS PRN #30 with 0 refills. She will practice good sleep hygiene.  Counseling  Limit or avoid alcohol, caffeinated beverages, and cigarettes, especially close to bedtime.   Do not eat a large meal or eat spicy foods right before bedtime. This can lead to digestive discomfort that can make it hard for you to sleep.  Keep a sleep diary  to help you and your health care provider figure out what could be causing your insomnia.  . Make your bedroom a dark, comfortable place where it is easy to fall asleep. ? Put up shades or blackout curtains  to block light from outside. ? Use a white noise machine to block noise. ? Keep the temperature cool. . Limit screen use before bedtime. This includes: ? Watching TV. ? Using your smartphone, tablet, or computer. . Stick to a routine that includes going to bed and waking up at the same times every day and night. This can help you fall asleep faster. Consider making a quiet activity, such as reading, part of your nighttime routine. . Try to avoid taking naps during the day so that you sleep better at night. . Get out of bed if you are still awake after 15 minutes of trying to sleep. Keep the lights down, but try reading or doing a quiet activity. When you feel sleepy, go back to bed.  At risk for diarrhea. Miranda Hunter was given approximately 15 minutes of diarrhea prevention counseling today. She is 34 y.o. female and has risk factors for diarrhea including medications and changes in diet. We discussed intensive lifestyle modifications today with an emphasis on specific weight loss instructions including dietary strategies.   Repetitive spaced learning was employed today to elicit superior memory formation and behavioral change.  Class 2 severe obesity with serious comorbidity and body mass index (BMI) of 36.0 to 36.9 in adult, unspecified obesity type (HCC).  Miranda Hunter is currently in the action stage of change. As such, her goal is to continue with weight loss efforts. She has agreed to the Category 3 Plan.   Exercise goals: Miranda Hunter will continue her current exercise regimen.   Behavioral modification strategies: increasing lean protein intake, meal planning and cooking strategies and planning for success.  Miranda Hunter has agreed to follow-up with our clinic in 4 weeks. She was informed of the importance of frequent follow-up visits to maximize her success with intensive lifestyle modifications for her multiple health conditions.   Objective:   Blood pressure 126/85, pulse 94, temperature 98 F  (36.7 C), temperature source Oral, height 5\' 9"  (1.753 m), weight 245 lb (111.1 kg), SpO2 99 %. Body mass index is 36.18 kg/m.  General: Cooperative, alert, well developed, in no acute distress. HEENT: Conjunctivae and lids unremarkable. Cardiovascular: Regular rhythm.  Lungs: Normal work of breathing. Neurologic: No focal deficits.   Lab Results  Component Value Date   CREATININE 0.64 05/04/2020   BUN 8 05/04/2020   NA 135 05/04/2020   K 4.5 05/04/2020   CL 101 05/04/2020   CO2 20 05/04/2020   Lab Results  Component Value Date   ALT 38 (H) 05/04/2020   AST 23 05/04/2020   ALKPHOS 112 05/04/2020   BILITOT 0.3 05/04/2020   Lab Results  Component Value Date   HGBA1C 5.7 (H) 05/04/2020   HGBA1C 5.4 10/26/2019   HGBA1C 5.5 06/08/2019   HGBA1C 5.5 12/03/2018   Lab Results  Component Value Date   INSULIN 31.0 (H) 05/04/2020   INSULIN 36.1 (H) 10/26/2019   INSULIN 33.7 (H) 06/08/2019   INSULIN 42.1 (H) 12/03/2018   Lab Results  Component Value Date   TSH 1.260 05/04/2020   Lab Results  Component Value Date   CHOL 198 05/04/2020   HDL 37 (L) 05/04/2020   LDLCALC 112 (H) 05/04/2020   TRIG 280 (H) 05/04/2020   Lab Results  Component Value Date  WBC 9.8 12/03/2018   HGB 13.8 12/03/2018   HCT 42.9 12/03/2018   MCV 90 12/03/2018   PLT 445.0 (H) 09/03/2017   No results found for: IRON, TIBC, FERRITIN  Attestation Statements:   Reviewed by clinician on day of visit: allergies, medications, problem list, medical history, surgical history, family history, social history, and previous encounter notes.  I, Marianna Payment, am acting as Energy manager for [employer], NP-C   I have reviewed the above documentation for accuracy and completeness, and I agree with the above. -  Julaine Fusi, NP

## 2020-08-02 ENCOUNTER — Ambulatory Visit (INDEPENDENT_AMBULATORY_CARE_PROVIDER_SITE_OTHER): Payer: 59 | Admitting: Psychology

## 2020-08-02 DIAGNOSIS — F411 Generalized anxiety disorder: Secondary | ICD-10-CM | POA: Diagnosis not present

## 2020-08-02 DIAGNOSIS — F321 Major depressive disorder, single episode, moderate: Secondary | ICD-10-CM | POA: Diagnosis not present

## 2020-08-06 ENCOUNTER — Other Ambulatory Visit (INDEPENDENT_AMBULATORY_CARE_PROVIDER_SITE_OTHER): Payer: Self-pay | Admitting: Adult Health

## 2020-08-06 DIAGNOSIS — F3289 Other specified depressive episodes: Secondary | ICD-10-CM

## 2020-08-10 ENCOUNTER — Ambulatory Visit (INDEPENDENT_AMBULATORY_CARE_PROVIDER_SITE_OTHER): Payer: 59 | Admitting: Psychology

## 2020-08-10 ENCOUNTER — Ambulatory Visit (INDEPENDENT_AMBULATORY_CARE_PROVIDER_SITE_OTHER): Payer: 59 | Admitting: Adult Health

## 2020-08-10 ENCOUNTER — Other Ambulatory Visit: Payer: Self-pay

## 2020-08-10 ENCOUNTER — Encounter (INDEPENDENT_AMBULATORY_CARE_PROVIDER_SITE_OTHER): Payer: Self-pay | Admitting: Adult Health

## 2020-08-10 VITALS — BP 118/81 | HR 80 | Temp 98.1°F | Ht 69.0 in | Wt 243.0 lb

## 2020-08-10 DIAGNOSIS — F3289 Other specified depressive episodes: Secondary | ICD-10-CM

## 2020-08-10 DIAGNOSIS — Z9189 Other specified personal risk factors, not elsewhere classified: Secondary | ICD-10-CM

## 2020-08-10 DIAGNOSIS — E782 Mixed hyperlipidemia: Secondary | ICD-10-CM

## 2020-08-10 DIAGNOSIS — F321 Major depressive disorder, single episode, moderate: Secondary | ICD-10-CM

## 2020-08-10 DIAGNOSIS — Z6836 Body mass index (BMI) 36.0-36.9, adult: Secondary | ICD-10-CM

## 2020-08-10 DIAGNOSIS — G4709 Other insomnia: Secondary | ICD-10-CM | POA: Diagnosis not present

## 2020-08-10 DIAGNOSIS — F411 Generalized anxiety disorder: Secondary | ICD-10-CM | POA: Diagnosis not present

## 2020-08-10 DIAGNOSIS — R7303 Prediabetes: Secondary | ICD-10-CM

## 2020-08-10 MED ORDER — ESCITALOPRAM OXALATE 20 MG PO TABS: 20.0000 mg | ORAL_TABLET | Freq: Every day | ORAL | 0 refills | Status: DC

## 2020-08-10 MED ORDER — TRAZODONE HCL 50 MG PO TABS: 50.0000 mg | ORAL_TABLET | Freq: Every day | ORAL | 0 refills | Status: DC

## 2020-08-11 NOTE — Progress Notes (Signed)
Chief Complaint:   OBESITY Miranda Hunter is here to discuss her progress with her obesity treatment plan along with follow-up of her obesity related diagnoses. [name] is on the Category 3 Plan and states she is following her eating plan approximately 50% of the time. Miranda Hunter states she is walking for 60 minutes 7 times per week.  Today's visit was #: 30 Starting weight: 257 lbs Starting date: 12/03/2018 Today's weight: 243 lbs Today's date: 08/10/2020 Total lbs lost to date: 14 lbs Total lbs lost since last in-office visit: 2 lbs  Interim History: Miranda Hunter's grandmother passed away and the family has been focused with assisting her 73 year old grandfather.  She established with a therapist and has a second appointment this week.  The therapist is at [place], [place].  She will be traveling to [place], [place] for her cousin's wedding and she has excellent plans to stay on track - high protein meals with frequent activities/exercise.  Subjective:   1. Prediabetes Thereasa has a diagnosis of prediabetes based on her elevated HgA1c and was informed this puts her at greater risk of developing diabetes. She continues to work on diet and exercise to decrease her risk of diabetes. She denies nausea or hypoglycemia.  A1c and BG both elevated at last check with slight improvement of insulin level. s he is on metformin 500 mg twice daily and tolerating it well.  Lab Results  Component Value Date   HGBA1C 5.7 (H) 05/04/2020   Lab Results  Component Value Date   INSULIN 31.0 (H) 05/04/2020   INSULIN 36.1 (H) 10/26/2019   INSULIN 33.7 (H) 06/08/2019   INSULIN 42.1 (H) 12/03/2018   2. Hyperlipidemia, mixed [name] has hyperlipidemia and has been trying to improve her cholesterol levels with intensive lifestyle modification including a low saturated fat diet, exercise and weight loss. She denies any chest pain, claudication or myalgias.  On 05/04/2020, lipid panel showed TGs and LDL  both above goal with low HDL.  Lab Results  Component Value Date   ALT 38 (H) 05/04/2020   AST 23 05/04/2020   ALKPHOS 112 05/04/2020   BILITOT 0.3 05/04/2020   Lab Results  Component Value Date   CHOL 198 05/04/2020   HDL 37 (L) 05/04/2020   LDLCALC 112 (H) 05/04/2020   TRIG 280 (H) 05/04/2020   3. Other insomnia Trazodone was increased from 25 mg to 50 mg QHS as needed on 05/04/2020.  She still struggles with quality of sleep.  4. Other depression, with emotional eating She reports stable mood.  Denies SI/HI. s he established with [facility]/Ms. Ophelia Charter and has her first follow-up today.  5. At risk for activity intolerance [name] is at risk for activity intolerance due to moderate, persistent asthma and she often uses albuterol at night.  Assessment/Plan:   1. Prediabetes Miranda Hunter will continue to work on weight loss, exercise, and decreasing simple carbohydrates to help decrease the risk of diabetes.  Continue metformin.  No refill needed today.  Check labs at next office visit.  2. Hyperlipidemia, mixed Cardiovascular risk and specific lipid/LDL goals reviewed.  We discussed several lifestyle modifications today and Miranda Hunter will continue to work on diet, exercise and weight loss efforts. Orders and follow up as documented in patient record.  Increase exercise.  Follow Category 3 meal plan.  Check labs at next office visit.  Counseling Intensive lifestyle modifications are the first line treatment for this issue. . Dietary changes: Increase soluble fiber. Decrease simple carbohydrates. . Exercise  changes: Moderate to vigorous-intensity aerobic activity 150 minutes per week if tolerated. . Lipid-lowering medications: see documented in medical record.  3. Other insomnia The problem of recurrent insomnia was discussed. Orders and follow up as documented in patient record. Counseling: Intensive lifestyle modifications are the first line treatment for this issue. We discussed  several lifestyle modifications today and she will continue to work on diet, exercise and weight loss efforts.  Trazodone refilled today.  Practice good sleep hygiene, continue regular exercise.  Counseling  Limit or avoid alcohol, caffeinated beverages, and cigarettes, especially close to bedtime.   Do not eat a large meal or eat spicy foods right before bedtime. This can lead to digestive discomfort that can make it hard for you to sleep.  Keep a sleep diary to help you and your health care provider figure out what could be causing your insomnia.  . Make your bedroom a dark, comfortable place where it is easy to fall asleep. ? Put up shades or blackout curtains to block light from outside. ? Use a white noise machine to block noise. ? Keep the temperature cool. . Limit screen use before bedtime. This includes: ? Watching TV. ? Using your smartphone, tablet, or computer. . Stick to a routine that includes going to bed and waking up at the same times every day and night. This can help you fall asleep faster. Consider making a quiet activity, such as reading, part of your nighttime routine. . Try to avoid taking naps during the day so that you sleep better at night. . Get out of bed if you are still awake after 15 minutes of trying to sleep. Keep the lights down, but try reading or doing a quiet activity. When you feel sleepy, go back to bed.  -Refill traZODone (DESYREL) 50 MG tablet; Take 1 tablet (50 mg total) by mouth at bedtime.  Dispense: 30 tablet; Refill: 0  4. Other depression, with emotional eating Refill escitalopram today, as per below.    -Refill escitalopram (LEXAPRO) 20 MG tablet; Take 1 tablet (20 mg total) by mouth daily.  Dispense: 30 tablet; Refill: 0  5. At risk for activity intolerance Miranda Hunter was given approximately 15 minutes of exercise intolerance counseling today. She is 34 y.o. female and has risk factors exercise intolerance including obesity. We discussed  intensive lifestyle modifications today with an emphasis on specific weight loss instructions and strategies. Miranda Hunter will slowly increase activity as tolerated.  Repetitive spaced learning was employed today to elicit superior memory formation and behavioral change.  6. Class 2 severe obesity with serious comorbidity and body mass index (BMI) of 36.0 to 36.9 in adult, unspecified obesity type (HCC) Gabryela is currently in the action stage of change. As such, her goal is to continue with weight loss efforts. She has agreed to the Category 3 Plan.   Exercise goals: Walking for 60 minutes 7 times per week.  Behavioral modification strategies: increasing lean protein intake, meal planning and cooking strategies, travel eating strategies, celebration eating strategies and planning for success.  Arena has agreed to follow-up with our clinic in 4 weeks, fasting. She was informed of the importance of frequent follow-up visits to maximize her success with intensive lifestyle modifications for her multiple health conditions.   Objective:   Blood pressure 118/81, pulse 80, temperature 98.1 F (36.7 C), temperature source Oral, height 5\' 9"  (1.753 m), weight 243 lb (110.2 kg), SpO2 96 %. Body mass index is 35.88 kg/m.  General: Cooperative, alert, well developed,  in no acute distress. HEENT: Conjunctivae and lids unremarkable. Cardiovascular: Regular rhythm.  Lungs: Normal work of breathing. Neurologic: No focal deficits.   Lab Results  Component Value Date   CREATININE 0.64 05/04/2020   BUN 8 05/04/2020   NA 135 05/04/2020   K 4.5 05/04/2020   CL 101 05/04/2020   CO2 20 05/04/2020   Lab Results  Component Value Date   ALT 38 (H) 05/04/2020   AST 23 05/04/2020   ALKPHOS 112 05/04/2020   BILITOT 0.3 05/04/2020   Lab Results  Component Value Date   HGBA1C 5.7 (H) 05/04/2020   HGBA1C 5.4 10/26/2019   HGBA1C 5.5 06/08/2019   HGBA1C 5.5 12/03/2018   Lab Results  Component Value  Date   INSULIN 31.0 (H) 05/04/2020   INSULIN 36.1 (H) 10/26/2019   INSULIN 33.7 (H) 06/08/2019   INSULIN 42.1 (H) 12/03/2018   Lab Results  Component Value Date   TSH 1.260 05/04/2020   Lab Results  Component Value Date   CHOL 198 05/04/2020   HDL 37 (L) 05/04/2020   LDLCALC 112 (H) 05/04/2020   TRIG 280 (H) 05/04/2020   Lab Results  Component Value Date   WBC 9.8 12/03/2018   HGB 13.8 12/03/2018   HCT 42.9 12/03/2018   MCV 90 12/03/2018   PLT 445.0 (H) 09/03/2017   Attestation Statements:   Reviewed by clinician on day of visit: allergies, medications, problem list, medical history, surgical history, family history, social history, and previous encounter notes.  I, Insurance claims handler, CMA, am acting as Energy manager for William Hamburger, NP.  I have reviewed the above documentation for accuracy and completeness, and I agree with the above. -  Julaine Fusi, NP

## 2020-08-23 ENCOUNTER — Ambulatory Visit (INDEPENDENT_AMBULATORY_CARE_PROVIDER_SITE_OTHER): Payer: 59 | Admitting: Psychology

## 2020-08-23 DIAGNOSIS — F411 Generalized anxiety disorder: Secondary | ICD-10-CM | POA: Diagnosis not present

## 2020-08-23 DIAGNOSIS — F321 Major depressive disorder, single episode, moderate: Secondary | ICD-10-CM | POA: Diagnosis not present

## 2020-09-05 ENCOUNTER — Encounter (INDEPENDENT_AMBULATORY_CARE_PROVIDER_SITE_OTHER): Payer: Self-pay | Admitting: Adult Health

## 2020-09-05 ENCOUNTER — Other Ambulatory Visit (INDEPENDENT_AMBULATORY_CARE_PROVIDER_SITE_OTHER): Payer: Self-pay | Admitting: Adult Health

## 2020-09-05 DIAGNOSIS — G4709 Other insomnia: Secondary | ICD-10-CM

## 2020-09-05 MED ORDER — TRAZODONE HCL 50 MG PO TABS: 50.0000 mg | ORAL_TABLET | Freq: Every day | ORAL | 0 refills | Status: DC

## 2020-09-05 NOTE — Telephone Encounter (Signed)
fyi

## 2020-09-06 ENCOUNTER — Ambulatory Visit: Payer: 59 | Admitting: Psychology

## 2020-09-07 ENCOUNTER — Ambulatory Visit (INDEPENDENT_AMBULATORY_CARE_PROVIDER_SITE_OTHER): Payer: 59 | Admitting: Adult Health

## 2020-09-14 ENCOUNTER — Ambulatory Visit (INDEPENDENT_AMBULATORY_CARE_PROVIDER_SITE_OTHER): Payer: 59 | Admitting: Adult Health

## 2020-09-14 ENCOUNTER — Other Ambulatory Visit: Payer: Self-pay

## 2020-09-14 ENCOUNTER — Encounter (INDEPENDENT_AMBULATORY_CARE_PROVIDER_SITE_OTHER): Payer: Self-pay | Admitting: Adult Health

## 2020-09-14 VITALS — BP 112/77 | HR 95 | Temp 98.2°F | Ht 69.0 in | Wt 242.0 lb

## 2020-09-14 DIAGNOSIS — F3289 Other specified depressive episodes: Secondary | ICD-10-CM | POA: Diagnosis not present

## 2020-09-14 DIAGNOSIS — Z9189 Other specified personal risk factors, not elsewhere classified: Secondary | ICD-10-CM | POA: Diagnosis not present

## 2020-09-14 DIAGNOSIS — E782 Mixed hyperlipidemia: Secondary | ICD-10-CM

## 2020-09-14 DIAGNOSIS — Z6835 Body mass index (BMI) 35.0-35.9, adult: Secondary | ICD-10-CM

## 2020-09-14 DIAGNOSIS — R7303 Prediabetes: Secondary | ICD-10-CM

## 2020-09-14 MED ORDER — ESCITALOPRAM OXALATE 20 MG PO TABS: 20.0000 mg | ORAL_TABLET | Freq: Every day | ORAL | 0 refills | Status: DC

## 2020-09-15 NOTE — Progress Notes (Signed)
Chief Complaint:   OBESITY Miranda Hunter is here to discuss her progress with her obesity treatment plan along with follow-up of her obesity related diagnoses. Zamaria is on the Category 3 Plan and states she is following her eating plan approximately 80% of the time. Hisako states she is walking 60 minutes 7 times per week.  Today's visit was #: 31 Starting weight: 257 lbs Starting date: 12/03/2018 Today's weight: 242 lbs Today's date: 09/14/2020 Total lbs lost to date: 15 Total lbs lost since last in-office visit: 1  Interim History: Antionetta traveled to [place], [place] for a wedding and had a wonderful trip. After she returned home she experienced complications with her IUD - device was removed and she was placed on progesterone only oral birth control pill. Later,she was at the beach with her family and cut the bottom of her left foot.  She has been treating the deep wound with daily cleaning and packing. She denies signs and symptoms of infection. Her tetanus immunization is up-to-date. Her left foot wound is slowly improving, however, it is difficult to exercise at high intensity.  She will travel to [place] this weekend to help her sister shop for her wedding dress.   Subjective:   Prediabetes. Megon has a diagnosis of prediabetes based on her elevated HgA1c and was informed this puts her at greater risk of developing diabetes. She continues to work on diet and exercise to decrease her risk of diabetes. She denies nausea or hypoglycemia. 05/04/2020 blood glucose 101, A1c 5.7 with an insulin level of 31.0 (A1c elevated into prediabetic range and insulin level slightly improved). Mayvis is on metformin 500 mg BID, which she is tolerating well.  Lab Results  Component Value Date   HGBA1C 5.7 (H) 05/04/2020   Lab Results  Component Value Date   INSULIN 31.0 (H) 05/04/2020   INSULIN 36.1 (H) 10/26/2019   INSULIN 33.7 (H) 06/08/2019   INSULIN 42.1 (H) 12/03/2018    Other depression, with emotional eating. Hayleigh is struggling with emotional eating and using food for comfort to the extent that it is negatively impacting her health. She has been working on behavior modification techniques to help reduce her emotional eating and has been somewhat successful. She shows no sign of suicidal or homicidal ideations. Myliyah reports stable mood. She reports regular follow-up with a therapist at [facility]/Dr. Ophelia Charter.  Hyperlipidemia, mixed. Emerald has hyperlipidemia and has been trying to improve her cholesterol levels with intensive lifestyle modification including a low saturated fat diet, exercise and weight loss. She denies any chest pain, claudication or myalgias. Lipid panel on 05/04/2020 showed low HDL, elevated triglycerides, and elevated LDL. Andalyn is not on statin therapy.  Lab Results  Component Value Date   ALT 38 (H) 05/04/2020   AST 23 05/04/2020   ALKPHOS 112 05/04/2020   BILITOT 0.3 05/04/2020   Lab Results  Component Value Date   CHOL 198 05/04/2020   HDL 37 (L) 05/04/2020   LDLCALC 112 (H) 05/04/2020   TRIG 280 (H) 05/04/2020   At risk for activity intolerance. Salvatore is at risk of exercise intolerance due to left food wound.  Assessment/Plan:   Prediabetes. Kiandria will continue to work on weight loss, exercise, and decreasing simple carbohydrates to help decrease the risk of diabetes. She will continue metformin as directed and continue healthy eating. She will walk as tolerated.  Other depression, with emotional eating. Behavior modification techniques were discussed today to help Debbora deal with  her emotional/non-hunger eating behaviors.  Orders and follow up as documented in patient record. She will continue regular follow-up with her therapist as scheduled. Refill was given for escitalopram (LEXAPRO) 20 MG tablet daily #30 with 0 refills.  Hyperlipidemia, mixed. Cardiovascular risk and specific lipid/LDL  goals reviewed.  We discussed several lifestyle modifications today and Beula will continue to work on diet, exercise and weight loss efforts. Orders and follow up as documented in patient record. Labs will be checked at her next office visit. She will walk as tolerated.  Counseling Intensive lifestyle modifications are the first line treatment for this issue.  Dietary changes: Increase soluble fiber. Decrease simple carbohydrates.  Exercise changes: Moderate to vigorous-intensity aerobic activity 150 minutes per week if tolerated.  Lipid-lowering medications: see documented in medical record.  At risk for activity intolerance. Jackquelyn was given approximately 15 minutes of exercise intolerance counseling today. She is 34 y.o. female and has risk factors exercise intolerance including obesity. We discussed intensive lifestyle modifications today with an emphasis on specific weight loss instructions and strategies. Evola will slowly increase activity as tolerated.  Repetitive spaced learning was employed today to elicit superior memory formation and behavioral change.  Class 2 severe obesity with serious comorbidity and body mass index (BMI) of 35.0 to 35.9 in adult, unspecified obesity type (HCC).  Fatma is currently in the action stage of change. As such, her goal is to continue with weight loss efforts. She has agreed to the Category 3 Plan.   Labs will be checked at the time of her next office visit.  Exercise goals: Myldred will continue walking 60 minutes 7 times per week.  Behavioral modification strategies: increasing lean protein intake, increasing water intake, decreasing eating out, no skipping meals and planning for success.  Dmiyah has agreed to follow-up with our clinic fasting in 4 weeks. She was informed of the importance of frequent follow-up visits to maximize her success with intensive lifestyle modifications for her multiple health conditions.   Objective:    Blood pressure 112/77, pulse 95, temperature 98.2 F (36.8 C), height 5\' 9"  (1.753 m), weight 242 lb (109.8 kg), SpO2 97 %. Body mass index is 35.74 kg/m.  General: Cooperative, alert, well developed, in no acute distress. HEENT: Conjunctivae and lids unremarkable. Cardiovascular: Regular rhythm.  Lungs: Normal work of breathing. Neurologic: No focal deficits.   Lab Results  Component Value Date   CREATININE 0.64 05/04/2020   BUN 8 05/04/2020   NA 135 05/04/2020   K 4.5 05/04/2020   CL 101 05/04/2020   CO2 20 05/04/2020   Lab Results  Component Value Date   ALT 38 (H) 05/04/2020   AST 23 05/04/2020   ALKPHOS 112 05/04/2020   BILITOT 0.3 05/04/2020   Lab Results  Component Value Date   HGBA1C 5.7 (H) 05/04/2020   HGBA1C 5.4 10/26/2019   HGBA1C 5.5 06/08/2019   HGBA1C 5.5 12/03/2018   Lab Results  Component Value Date   INSULIN 31.0 (H) 05/04/2020   INSULIN 36.1 (H) 10/26/2019   INSULIN 33.7 (H) 06/08/2019   INSULIN 42.1 (H) 12/03/2018   Lab Results  Component Value Date   TSH 1.260 05/04/2020   Lab Results  Component Value Date   CHOL 198 05/04/2020   HDL 37 (L) 05/04/2020   LDLCALC 112 (H) 05/04/2020   TRIG 280 (H) 05/04/2020   Lab Results  Component Value Date   WBC 9.8 12/03/2018   HGB 13.8 12/03/2018   HCT 42.9 12/03/2018  MCV 90 12/03/2018   PLT 445.0 (H) 09/03/2017   No results found for: IRON, TIBC, FERRITIN  Attestation Statements:   Reviewed by clinician on day of visit: allergies, medications, problem list, medical history, surgical history, family history, social history, and previous encounter notes.  I, Marianna Payment, am acting as Energy manager for [employer], NP-C   I have reviewed the above documentation for accuracy and completeness, and I agree with the above. -  Quamaine Webb d. Loreley Schwall, NP-C

## 2020-09-19 ENCOUNTER — Telehealth (INDEPENDENT_AMBULATORY_CARE_PROVIDER_SITE_OTHER): Payer: 59 | Admitting: Physician Assistant

## 2020-09-19 ENCOUNTER — Other Ambulatory Visit: Payer: Self-pay

## 2020-09-19 ENCOUNTER — Encounter: Payer: Self-pay | Admitting: Physician Assistant

## 2020-09-19 VITALS — Temp 97.7°F | Ht 69.0 in | Wt 240.0 lb

## 2020-09-19 DIAGNOSIS — R059 Cough, unspecified: Secondary | ICD-10-CM | POA: Diagnosis not present

## 2020-09-19 MED ORDER — CHERATUSSIN AC 100-10 MG/5ML PO SYRP
10.0000 mL | ORAL_SOLUTION | Freq: Every evening | ORAL | 0 refills | Status: DC | PRN
Start: 2020-09-19 — End: 2020-10-12

## 2020-09-19 MED ORDER — AZITHROMYCIN 250 MG PO TABS: ORAL_TABLET | ORAL | 0 refills | Status: DC

## 2020-09-19 MED ORDER — CHERATUSSIN AC 100-10 MG/5ML PO SYRP
10.0000 mL | ORAL_SOLUTION | Freq: Every evening | ORAL | 0 refills | Status: DC | PRN
Start: 2020-09-19 — End: 2020-09-19

## 2020-09-19 NOTE — Progress Notes (Signed)
Virtual Visit via Video   I connected with Miranda Hunter on 09/19/20 at 11:00 AM EDT by a video enabled telemedicine application and verified that I am speaking with the correct person using two identifiers. Location patient: Home Location provider: [facility], Office Persons participating in the virtual visit: Simranjit, Thayer PA-C, Corky Mull, LPN   I discussed the limitations of evaluation and management by telemedicine and the availability of in person appointments. The patient expressed understanding and agreed to proceed.  I acted as a Neurosurgeon for [employer], PA-C [employer], LPN   Subjective:   HPI:   Patient is requesting evaluation for possible COVID-19.  Symptom onset: Last Wed 10/20. Pt did COVID test yesterday was Negative  Travel/contacts: No exposure  Vaccination status: complete  Patient endorses the following symptoms: sinus headache, sinus congestion, itchy watery eyes, ear pain, sore throat, productive cough (expectorating green) and wheezing  Patient denies the following symptoms: Fever (no), difficulty swallowing, shortness of breath and chest tightness  Treatments tried: Mucinex, Delsym, albuterol Inhaler  Patient risk factors: Current COVID-19 risk of complications score: 2 Smoking status: Miranda Hunter  reports that she has never smoked. She has never used smokeless tobacco. If female, currently pregnant? []   Yes [x]   No  ROS: See pertinent positives and negatives per HPI.  Patient Active Problem List   Diagnosis Date Noted   Prediabetes 06/06/2020   Transaminitis 06/06/2020   Hyperlipidemia, mixed 06/06/2020   Other insomnia 06/06/2020   Cervical intraepithelial neoplasia grade 1 04/27/2020   Insulin resistance 10/01/2019   Depression 04/28/2019   Class 2 severe obesity with serious comorbidity and body mass index (BMI) of 35.0 to 35.9 in adult [facility]) 04/28/2019   Migraine with aura and without  status migrainosus, not intractable 10/14/2018   Moderate persistent asthma 09/16/2018    Social History   Tobacco Use   Smoking status: Never Smoker   Smokeless tobacco: Never Used  Substance Use Topics   Alcohol use: Yes    Comment: socially    Current Outpatient Medications:    albuterol (VENTOLIN HFA) 108 (90 Base) MCG/ACT inhaler, Inhale 2 puffs into the lungs every 6 (six) hours as needed for wheezing or shortness of breath., Disp: 18 g, Rfl: 2   cetirizine (ZYRTEC) 10 MG tablet, Take 10 mg by mouth daily., Disp: , Rfl:    escitalopram (LEXAPRO) 20 MG tablet, Take 1 tablet (20 mg total) by mouth daily., Disp: 30 tablet, Rfl: 0   Flaxseed, Linseed, (FLAX SEEDS PO), Take 1 tablet by mouth at bedtime., Disp: , Rfl:    fluticasone (FLONASE) 50 MCG/ACT nasal spray, , Disp: , Rfl:    ketorolac (TORADOL) 10 MG tablet, Take 1 tablet (10 mg total) by mouth every 6 (six) hours as needed., Disp: 20 tablet, Rfl: 11   metFORMIN (GLUCOPHAGE) 500 MG tablet, Take 1 tablet (500 mg total) by mouth 2 (two) times daily with a meal., Disp: 60 tablet, Rfl: 0   Multiple Vitamin (MULTIVITAMIN) tablet, Take 1 tablet by mouth daily., Disp: , Rfl:    ondansetron (ZOFRAN-ODT) 4 MG disintegrating tablet, Take 1-2 tablets (4-8 mg total) by mouth every 8 (eight) hours as needed for nausea., Disp: 30 tablet, Rfl: 11   promethazine (PHENERGAN) 25 MG tablet, Take 1 tablet (25 mg total) by mouth every 6 (six) hours as needed for nausea or vomiting., Disp: 30 tablet, Rfl: 6   rizatriptan (MAXALT-MLT) 10 MG disintegrating tablet, Take 1 tablet (10 mg total)  by mouth as needed for migraine. May repeat in 2 hours if needed, Disp: 9 tablet, Rfl: 11   traZODone (DESYREL) 50 MG tablet, Take 1 tablet (50 mg total) by mouth at bedtime., Disp: 30 tablet, Rfl: 0   azithromycin (ZITHROMAX Z-PAK) 250 MG tablet, Take 2 tablets ( total of 500 mg) PO on day 1, then 1 tablet ( total of 250 mg) PO q24 x 4 days., Disp: 6  each, Rfl: 0   guaiFENesin-codeine (CHERATUSSIN AC) 100-10 MG/5ML syrup, Take 10 mLs by mouth at bedtime as needed for cough or congestion., Disp: 120 mL, Rfl: 0  Allergies  Allergen Reactions   Amoxicillin-Pot Clavulanate Diarrhea    Objective:   VITALS: Per patient if applicable, see vitals. GENERAL: Alert, appears well and in no acute distress. HEENT: Atraumatic, conjunctiva clear, no obvious abnormalities on inspection of external nose and ears. NECK: Normal movements of the head and neck. CARDIOPULMONARY: No increased WOB. Speaking in clear sentences. I:E ratio WNL.  MS: Moves all visible extremities without noticeable abnormality. PSYCH: Pleasant and cooperative, well-groomed. Speech normal rate and rhythm. Affect is appropriate. Insight and judgement are appropriate. Attention is focused, linear, and appropriate.  NEURO: CN grossly intact. Oriented as arrived to appointment on time with no prompting. Moves both UE equally.  SKIN: No obvious lesions, wounds, erythema, or cyanosis noted on face or hands.  Assessment and Plan:   [name] was seen today for covid symptoms.  Diagnoses and all orders for this visit:  Cough No red flags on discussion, patient is not in any obvious distress during our visit. Discussed progression of most viral illness, and recommended supportive care at this point in time. I did prescribe cheratussin cough syrup to help her be able to try to get some rest -- sleepy precautions advised. I did however provide pocket rx for oral azithromycin should symptoms not improve as anticipated. Discussed over the counter supportive care options, with recommendations to push fluids and rest. Reviewed return precautions including new/worsening fever, SOB, new/worsening cough or other concerns.  Recommended need to self-quarantine and practice social distancing until symptoms resolve. Discussed current recommendations for COVID testing -- fortunately she has  already been tested. I recommend that patient follow-up if symptoms worsen or persist despite treatment x 7-10 days, sooner if needed.  Other orders -     azithromycin (ZITHROMAX Z-PAK) 250 MG tablet; Take 2 tablets ( total of 500 mg) PO on day 1, then 1 tablet ( total of 250 mg) PO q24 x 4 days. -     guaiFENesin-codeine (CHERATUSSIN AC) 100-10 MG/5ML syrup; Take 10 mLs by mouth at bedtime as needed for cough or congestion.    I discussed the assessment and treatment plan with the patient. The patient was provided an opportunity to ask questions and all were answered. The patient agreed with the plan and demonstrated an understanding of the instructions.   The patient was advised to call back or seek an in-person evaluation if the symptoms worsen or if the condition fails to improve as anticipated.   CMA or LPN served as scribe during this visit. History, Physical, and Plan performed by medical provider. The above documentation has been reviewed and is accurate and complete.   [place], [place] 09/19/2020

## 2020-09-20 ENCOUNTER — Ambulatory Visit (INDEPENDENT_AMBULATORY_CARE_PROVIDER_SITE_OTHER): Payer: 59 | Admitting: Psychology

## 2020-09-20 ENCOUNTER — Encounter: Payer: Self-pay | Admitting: Physician Assistant

## 2020-09-20 DIAGNOSIS — F321 Major depressive disorder, single episode, moderate: Secondary | ICD-10-CM

## 2020-09-20 DIAGNOSIS — F411 Generalized anxiety disorder: Secondary | ICD-10-CM

## 2020-09-21 ENCOUNTER — Other Ambulatory Visit: Payer: Self-pay | Admitting: Physician Assistant

## 2020-09-21 DIAGNOSIS — F909 Attention-deficit hyperactivity disorder, unspecified type: Secondary | ICD-10-CM

## 2020-09-22 ENCOUNTER — Telehealth (INDEPENDENT_AMBULATORY_CARE_PROVIDER_SITE_OTHER): Payer: 59 | Admitting: Psychiatry

## 2020-09-22 ENCOUNTER — Encounter (HOSPITAL_COMMUNITY): Payer: Self-pay | Admitting: Psychiatry

## 2020-09-22 DIAGNOSIS — F411 Generalized anxiety disorder: Secondary | ICD-10-CM | POA: Diagnosis not present

## 2020-09-22 DIAGNOSIS — F900 Attention-deficit hyperactivity disorder, predominantly inattentive type: Secondary | ICD-10-CM

## 2020-09-22 DIAGNOSIS — F5102 Adjustment insomnia: Secondary | ICD-10-CM | POA: Diagnosis not present

## 2020-09-22 MED ORDER — AMPHETAMINE-DEXTROAMPHETAMINE 10 MG PO TABS: 10.0000 mg | ORAL_TABLET | Freq: Every day | ORAL | 0 refills | Status: DC

## 2020-09-22 NOTE — Progress Notes (Signed)
Psychiatric Initial Adult Assessment   Patient Identification: Miranda Hunter MRN:  [id number] Date of Evaluation:  09/22/2020 Referral Source: primary care Chief Complaint:  inattention, Establish care Visit Diagnosis:    ICD-10-CM   1. GAD (generalized anxiety disorder)  F41.1   2. Adjustment insomnia  F51.02   3. Attention deficit hyperactivity disorder (ADHD), predominantly inattentive type  F90.0     I connected with [name] on 09/22/20 at  9:00 AM EDT by a video enabled telemedicine application and verified that I am speaking with the correct person using two identifiers.  Location: Patient: home Provider: home office   I discussed the limitations of evaluation and management by telemedicine and the availability of in person appointments. The patient expressed understanding and agreed to proceed.   History of Present Illness: 34 years old single White female referred by PCP for management of inattention. Patient has been a part of weight maintenance program and has been on Wellbutrin and Strattera at that still did not help the inattention  She has suffered from anxiety excessive worries in the past for which she is taking Lexapro she feels her mood depression anxiety is manageable with the Lexapro She takes trazodone at night was having irregular and poor sleep mind racing at night and difficulty going to sleep. Her current height is 5 foot 9 inches and weight is 240 pounds she says that she has lost 20 pounds in the last few months When she was a young teenager she was weighing 160 pounds she was diagnosed with ADHD when she was in high school and she did not want take medication when she graduated she was at a dose along 30 mg  Patient currently is working from home and was informed that she will continue to work from home that has caused some trouble because it is getting her difficulty focus remain on focus complete tasks she is doing excessive hours to complete the  task gets forgetful inattentive and easily distracted This is affecting her performance and yesterday her boss has mentioned is everything okay to her She feels her anxiety level is coming back because of the fact that she is not able to get her things done or gets distracted She does snore at occasions has gained weight in the last few years.  She wakes up somewhat tired and fatigued at times.  We have discussed possibly ruling out sleep apnea that can be contributing to  her inattention as well Aggravating factors; history of ADHD.  Grandmother death Modifying factors; dog, her job, family  Duration ADHD since young age  Severity affecting job affecting relationship and causing anxiety  Denies past psych admission denies past psychotic symptoms manic symptoms or suicide attempt       Past Psychiatric History: anxiety, inattention  Previous Psychotropic Medications: Yes   Substance Abuse History in the last 12 months:  No.  Consequences of Substance Abuse: NA  Past Medical History:  Past Medical History:  Diagnosis Date  . Anxiety   . Asthma   . History of chicken pox   . IBS (irritable bowel syndrome)   . Migraines     Past Surgical History:  Procedure Laterality Date  . tonsillectomy and adnoidectomy Bilateral 2007    Family Psychiatric History: denies  Family History:  Family History  Problem Relation Age of Onset  . Migraines Mother   . Prostate cancer Father   . Hypertension Father   . Asthma Sister   . Lung cancer Maternal Grandmother  heavy smoker  . Migraines Maternal Grandmother   . Lung cancer Maternal Grandfather        heavy smoker  . Asthma Sister     Social History:   Social History   Socioeconomic History  . Marital status: Single    Spouse name: Not on file  . Number of children: 0  . Years of education: Not on file  . Highest education level: Bachelor's degree (e.g., BA, AB, BS)  Occupational History  . Not on file  Tobacco  Use  . Smoking status: Never Smoker  . Smokeless tobacco: Never Used  Vaping Use  . Vaping Use: Never used  Substance and Sexual Activity  . Alcohol use: Yes    Comment: socially  . Drug use: No  . Sexual activity: Not Currently    Comment: kyleena  Other Topics Concern  . Not on file  Social History Narrative   She is a Psychiatric nurseSam"   Recently got a new puppy- a Advertising account planner named [name]   Caffeine once a week      Social Determinants of Corporate investment banker Strain:   . Difficulty of Paying Living Expenses: Not on file  Food Insecurity:   . Worried About Programme researcher, broadcasting/film/video in the Last Year: Not on file  . Ran Out of Food in the Last Year: Not on file  Transportation Needs:   . Lack of Transportation (Medical): Not on file  . Lack of Transportation (Non-Medical): Not on file  Physical Activity:   . Days of Exercise per Week: Not on file  . Minutes of Exercise per Session: Not on file  Stress:   . Feeling of Stress : Not on file  Social Connections:   . Frequency of Communication with Friends and Family: Not on file  . Frequency of Social Gatherings with Friends and Family: Not on file  . Attends Religious Services: Not on file  . Active Member of Clubs or Organizations: Not on file  . Attends Banker Meetings: Not on file  . Marital Status: Not on file    Additional Social History: grew up with parents. Good childhood. No trauma  Allergies:   Allergies  Allergen Reactions  . Amoxicillin-Pot Clavulanate Diarrhea    Metabolic Disorder Labs: Lab Results  Component Value Date   HGBA1C 5.7 (H) 05/04/2020   No results found for: PROLACTIN Lab Results  Component Value Date   CHOL 198 05/04/2020   TRIG 280 (H) 05/04/2020   HDL 37 (L) 05/04/2020   LDLCALC 112 (H) 05/04/2020   LDLCALC 75 10/26/2019   Lab Results  Component Value Date   TSH 1.260 05/04/2020    Therapeutic Level Labs: No results found for: LITHIUM No results  found for: CBMZ No results found for: VALPROATE  Current Medications: Current Outpatient Medications  Medication Sig Dispense Refill  . albuterol (VENTOLIN HFA) 108 (90 Base) MCG/ACT inhaler Inhale 2 puffs into the lungs every 6 (six) hours as needed for wheezing or shortness of breath. 18 g 2  . amphetamine-dextroamphetamine (ADDERALL) 10 MG tablet Take 1 tablet (10 mg total) by mouth daily. 30 tablet 0  . azithromycin (ZITHROMAX Z-PAK) 250 MG tablet Take 2 tablets ( total of 500 mg) PO on day 1, then 1 tablet ( total of 250 mg) PO q24 x 4 days. 6 each 0  . cetirizine (ZYRTEC) 10 MG tablet Take 10 mg by mouth daily.    Marland Kitchen escitalopram (LEXAPRO)  20 MG tablet Take 1 tablet (20 mg total) by mouth daily. 30 tablet 0  . Flaxseed, Linseed, (FLAX SEEDS PO) Take 1 tablet by mouth at bedtime.    . fluticasone (FLONASE) 50 MCG/ACT nasal spray     . guaiFENesin-codeine (CHERATUSSIN AC) 100-10 MG/5ML syrup Take 10 mLs by mouth at bedtime as needed for cough or congestion. 120 mL 0  . ketorolac (TORADOL) 10 MG tablet Take 1 tablet (10 mg total) by mouth every 6 (six) hours as needed. 20 tablet 11  . metFORMIN (GLUCOPHAGE) 500 MG tablet Take 1 tablet (500 mg total) by mouth 2 (two) times daily with a meal. 60 tablet 0  . Multiple Vitamin (MULTIVITAMIN) tablet Take 1 tablet by mouth daily.    . ondansetron (ZOFRAN-ODT) 4 MG disintegrating tablet Take 1-2 tablets (4-8 mg total) by mouth every 8 (eight) hours as needed for nausea. 30 tablet 11  . promethazine (PHENERGAN) 25 MG tablet Take 1 tablet (25 mg total) by mouth every 6 (six) hours as needed for nausea or vomiting. 30 tablet 6  . rizatriptan (MAXALT-MLT) 10 MG disintegrating tablet Take 1 tablet (10 mg total) by mouth as needed for migraine. May repeat in 2 hours if needed 9 tablet 11  . traZODone (DESYREL) 50 MG tablet Take 1 tablet (50 mg total) by mouth at bedtime. 30 tablet 0   No current facility-administered medications for this visit.       Psychiatric Specialty Exam: Review of Systems  Last menstrual period 09/05/2020.There is no height or weight on file to calculate BMI.  General Appearance: Casual  Eye Contact:  Fair  Speech:  Slow  Volume:  Normal  Mood:  Euthymic  Affect:  Congruent  Thought Process:  Goal Directed  Orientation:  Full (Time, Place, and Person)  Thought Content:  Logical  Suicidal Thoughts:  No  Homicidal Thoughts:  No  Memory:  Immediate;   Fair Recent;   Fair  Judgement:  Fair  Insight:  Fair  Psychomotor Activity:  Normal  Concentration:  Concentration: Fair and Attention Span: variable  Recall:  Good  Fund of Knowledge:Good  Language: Good  Akathisia:  No  Handed:   AIMS (if indicated):  not done  Assets:  Desire for Improvement Housing  ADL's:  Intact  Cognition: WNL  Sleep:  Fair   Screenings: PHQ2-9     [facility] Visit from 12/03/2018 in [facility] Office Visit from 09/03/2017 in [facility]  PHQ-2 Total Score 5 0  PHQ-9 Total Score 13 --      Assessment and Plan: as follows ADHD; she gets distracted disorganized affecting her job.  Has used Strattera and Wellbutrin with no effect.  We will start Adderall 10 mg once a day discussed and reviewed side effects Also recommend to get a sleep study to rule out sleep apnea contributing to any inattention  Work on increasing physical activity and having a regular structured day  Generalized anxiety disorder; she is already on Lexapro it is helping Insomnia; possible related to sleep apnea and also anxiety.  She is on trazodone discussed and reviewed sleep hygiene she will also consider to get a sleep evaluation done or sleep study  I discussed the assessment and treatment plan with the patient. The patient was provided an opportunity to ask questions and all were answered. The patient agreed with the plan and demonstrated an understanding of the instructions.   The patient was advised  to call back or  seek an in-person evaluation if the symptoms worsen or if the condition fails to improve as anticipated.  FU 3-4 weeks or earlier if needed  I provided 35  minutes of non-face-to-face time during this encounter.   Thresa RossNadeem Braelee Herrle, MD 10/28/20219:38 AM

## 2020-09-27 ENCOUNTER — Encounter: Payer: Self-pay | Admitting: Physician Assistant

## 2020-09-27 ENCOUNTER — Other Ambulatory Visit: Payer: Self-pay | Admitting: Physician Assistant

## 2020-09-27 MED ORDER — DOXYCYCLINE HYCLATE 100 MG PO TABS: 100.0000 mg | ORAL_TABLET | Freq: Two times a day (BID) | ORAL | 0 refills | Status: DC

## 2020-10-12 ENCOUNTER — Encounter (INDEPENDENT_AMBULATORY_CARE_PROVIDER_SITE_OTHER): Payer: Self-pay | Admitting: Adult Health

## 2020-10-12 ENCOUNTER — Ambulatory Visit (INDEPENDENT_AMBULATORY_CARE_PROVIDER_SITE_OTHER): Payer: 59 | Admitting: Adult Health

## 2020-10-12 ENCOUNTER — Other Ambulatory Visit: Payer: Self-pay

## 2020-10-12 VITALS — BP 117/82 | HR 87 | Temp 97.9°F | Ht 69.0 in | Wt 241.0 lb

## 2020-10-12 DIAGNOSIS — Z9189 Other specified personal risk factors, not elsewhere classified: Secondary | ICD-10-CM

## 2020-10-12 DIAGNOSIS — G4709 Other insomnia: Secondary | ICD-10-CM

## 2020-10-12 DIAGNOSIS — E782 Mixed hyperlipidemia: Secondary | ICD-10-CM | POA: Diagnosis not present

## 2020-10-12 DIAGNOSIS — R7401 Elevation of levels of liver transaminase levels: Secondary | ICD-10-CM | POA: Diagnosis not present

## 2020-10-12 DIAGNOSIS — R7303 Prediabetes: Secondary | ICD-10-CM | POA: Diagnosis not present

## 2020-10-12 DIAGNOSIS — E559 Vitamin D deficiency, unspecified: Secondary | ICD-10-CM | POA: Diagnosis not present

## 2020-10-12 DIAGNOSIS — F3289 Other specified depressive episodes: Secondary | ICD-10-CM

## 2020-10-12 DIAGNOSIS — Z6835 Body mass index (BMI) 35.0-35.9, adult: Secondary | ICD-10-CM

## 2020-10-12 DIAGNOSIS — F988 Other specified behavioral and emotional disorders with onset usually occurring in childhood and adolescence: Secondary | ICD-10-CM

## 2020-10-12 MED ORDER — TRAZODONE HCL 50 MG PO TABS: 50.0000 mg | ORAL_TABLET | Freq: Every day | ORAL | 0 refills | Status: DC

## 2020-10-12 MED ORDER — ESCITALOPRAM OXALATE 20 MG PO TABS: 20.0000 mg | ORAL_TABLET | Freq: Every day | ORAL | 0 refills | Status: DC

## 2020-10-13 ENCOUNTER — Ambulatory Visit (INDEPENDENT_AMBULATORY_CARE_PROVIDER_SITE_OTHER): Payer: 59 | Admitting: Psychology

## 2020-10-13 DIAGNOSIS — F321 Major depressive disorder, single episode, moderate: Secondary | ICD-10-CM | POA: Diagnosis not present

## 2020-10-13 DIAGNOSIS — F411 Generalized anxiety disorder: Secondary | ICD-10-CM

## 2020-10-13 LAB — LIPID PANEL
Chol/HDL Ratio: 4.6 ratio — ABNORMAL HIGH (ref 0.0–4.4)
Cholesterol, Total: 180 mg/dL (ref 100–199)
HDL: 39 mg/dL — ABNORMAL LOW (ref >39)
LDL Chol Calc (NIH): 99 mg/dL (ref 0–99)
Triglycerides: 246 mg/dL — ABNORMAL HIGH (ref 0–149)
VLDL Cholesterol Cal: 42 mg/dL — ABNORMAL HIGH (ref 5–40)

## 2020-10-13 LAB — COMPREHENSIVE METABOLIC PANEL
ALT: 21 IU/L (ref 0–32)
AST: 19 IU/L (ref 0–40)
Albumin/Globulin Ratio: 2 (ref 1.2–2.2)
Albumin: 4.6 g/dL (ref 3.8–4.8)
Alkaline Phosphatase: 103 IU/L (ref 44–121)
BUN/Creatinine Ratio: 13 (ref 9–23)
BUN: 8 mg/dL (ref 6–20)
Bilirubin Total: 0.3 mg/dL (ref 0.0–1.2)
CO2: 20 mmol/L (ref 20–29)
Calcium: 9.6 mg/dL (ref 8.7–10.2)
Chloride: 106 mmol/L (ref 96–106)
Creatinine, Ser: 0.61 mg/dL (ref 0.57–1.00)
GFR calc Af Amer: 139 mL/min/{1.73_m2} (ref >59)
GFR calc non Af Amer: 120 mL/min/{1.73_m2} (ref >59)
Globulin, Total: 2.3 g/dL (ref 1.5–4.5)
Glucose: 88 mg/dL (ref 65–99)
Potassium: 4.6 mmol/L (ref 3.5–5.2)
Sodium: 139 mmol/L (ref 134–144)
Total Protein: 6.9 g/dL (ref 6.0–8.5)

## 2020-10-13 LAB — HEMOGLOBIN A1C
Est. average glucose Bld gHb Est-mCnc: 114 mg/dL
Hgb A1c MFr Bld: 5.6 % (ref 4.8–5.6)

## 2020-10-13 LAB — INSULIN, RANDOM: INSULIN: 39.4 u[IU]/mL — ABNORMAL HIGH (ref 2.6–24.9)

## 2020-10-13 LAB — VITAMIN D 25 HYDROXY (VIT D DEFICIENCY, FRACTURES): Vit D, 25-Hydroxy: 52.2 ng/mL (ref 30.0–100.0)

## 2020-10-13 NOTE — Progress Notes (Signed)
Chief Complaint:   OBESITY Miranda Hunter is here to discuss her progress with her obesity treatment plan along with follow-up of her obesity related diagnoses. Miranda Hunter is on the Category 3 Plan and states she is following her eating plan approximately 80% of the time. Miranda Hunter states she is walking for 60 minutes 7 times per week.  Today's visit was #: 32 Starting weight: 257 lbs Starting date: 12/03/2018 Today's weight: 241 lbs Today's date: 10/12/2020 Total lbs lost to date: 16 lbs Total lbs lost since last in-office visit: 1 lb  Interim History: Miranda Hunter was started back on amphetamine-dextroamphetamine 10 mg daily on 09/22/2020 by Dr. Buelah Manis.  She reports improved focus/concentration.  She will be in [place] for 1 week for a family wedding and to celebrate Thanksgiving.  She has already thought ahead how to stay on track and remain as active as possible while travelling and celebrating.  Subjective:   1. Other insomnia She reports being able to fall and remain asleep with trazodone 50 mg at bedtime.  She denies morning drowsiness.  2. Prediabetes Miranda Hunter has a diagnosis of prediabetes based on her elevated HgA1c and was informed this puts her at greater risk of developing diabetes. She continues to work on diet and exercise to decrease her risk of diabetes. She denies nausea or hypoglycemia.  She is on metformin 500 mg twice daily and denies medication side effect.  A1c has steadily increased over the last few checks, however, insulin level has decreased.  Lab Results  Component Value Date   HGBA1C 5.6 10/12/2020   Lab Results  Component Value Date   INSULIN 39.4 (H) 10/12/2020   INSULIN 31.0 (H) 05/04/2020   INSULIN 36.1 (H) 10/26/2019   INSULIN 33.7 (H) 06/08/2019   INSULIN 42.1 (H) 12/03/2018   3. Transaminitis She denies RUQ pain.  ALT elevated on last CMP.  4. Hyperlipidemia, mixed Miranda Hunter has hyperlipidemia and has been trying to improve her cholesterol  levels with intensive lifestyle modification including a low saturated fat diet, exercise and weight loss. She denies any chest pain, claudication or myalgias.  On 05/04/2020, lipid panel showed low HDL, elevated triglycerides/LDL.  Not on statin therapy.  She denies tobacco/vape use.    Lab Results  Component Value Date   ALT 21 10/12/2020   AST 19 10/12/2020   ALKPHOS 103 10/12/2020   BILITOT 0.3 10/12/2020   Lab Results  Component Value Date   CHOL 180 10/12/2020   HDL 39 (L) 10/12/2020   LDLCALC 99 10/12/2020   TRIG 246 (H) 10/12/2020   CHOLHDL 4.6 (H) 10/12/2020   5. Vitamin D deficiency Miranda Hunter Vitamin D level was 46.1 on 05/04/2020. She is currently taking no vitamin D supplement. She denies nausea, vomiting or muscle weakness.  She stopped vitamin D due to nausea.  She is on an OTC multivitamin.  6. Attention deficit disorder, unspecified hyperactivity presence Psychiatry/[facility] restarted her on Adderall 10 mg daily.  PDMP reviewed today, no aberrancies noted.   7. Other depression, with emotional eating She reports stable mood.  Denies SI/HI.  8. At risk for heart disease Miranda Hunter is at a higher than average risk for cardiovascular disease due to obesity.   Assessment/Plan:   1. Other insomnia Will refill trazodone today, as per below.  -Refill traZODone (DESYREL) 50 MG tablet; Take 1 tablet (50 mg total) by mouth at bedtime.  Dispense: 30 tablet; Refill: 0  2. Prediabetes Miranda Hunter will continue to work on weight loss, exercise,  and decreasing simple carbohydrates to help decrease the risk of diabetes.  Check labs today.  - Hemoglobin A1c - Insulin, random  3. Transaminitis Check labs today.  - Comprehensive metabolic panel  4. Hyperlipidemia, mixed Cardiovascular risk and specific lipid/LDL goals reviewed.  We discussed several lifestyle modifications today and Miranda Hunter will continue to work on diet, exercise and weight loss efforts. Orders and follow up  as documented in patient record.  Will check labs today.  Counseling Intensive lifestyle modifications are the first line treatment for this issue. . Dietary changes: Increase soluble fiber. Decrease simple carbohydrates. . Exercise changes: Moderate to vigorous-intensity aerobic activity 150 minutes per week if tolerated. . Lipid-lowering medications: see documented in medical record.  - Comprehensive metabolic panel - Lipid panel  5. Vitamin D deficiency Low Vitamin D level contributes to fatigue and are associated with obesity, breast, and colon cancer. She agrees to continue to take prescription Vitamin D @50 ,000 IU every week and will follow-up for routine testing of Vitamin D, at least 2-3 times per year to avoid over-replacement.  Will check vitamin D level today.  - VITAMIN D 25 Hydroxy (Vit-D Deficiency, Fractures)  6. Attention deficit disorder, unspecified hyperactivity presence Continue with Dr. as directed.  7. Other depression, with emotional eating Will refill escitalopram 20 mg today, as per below.  -Refill escitalopram (LEXAPRO) 20 MG tablet; Take 1 tablet (20 mg total) by mouth daily.  Dispense: 30 tablet; Refill: 0  8. At risk for heart disease Miranda Hunter was given approximately 15 minutes of coronary artery disease prevention counseling today. She is 34 y.o. female and has risk factors for heart disease including obesity, HLD, and prediabetes. We discussed intensive lifestyle modifications today with an emphasis on specific weight loss instructions and strategies.   Repetitive spaced learning was employed today to elicit superior memory formation and behavioral change.  9. Class 2 severe obesity with serious comorbidity and body mass index (BMI) of 35.0 to 35.9 in adult, unspecified obesity type (HCC)  Miranda Hunter is currently in the action stage of change. As such, her goal is to continue with weight loss efforts. She has agreed to the Category 3 Plan.    Exercise goals: As is.  Behavioral modification strategies: increasing lean protein intake, meal planning and cooking strategies, keeping healthy foods in the home and planning for success.  Handout:  Thanksgiving.  Miranda Hunter has agreed to follow-up with our clinic in 3 weeks. She was informed of the importance of frequent follow-up visits to maximize her success with intensive lifestyle modifications for her multiple health conditions.   Miranda Hunter was informed we would discuss her lab results at her next visit unless there is a critical issue that needs to be addressed sooner. Miranda Hunter agreed to keep her next visit at the agreed upon time to discuss these results.  Objective:   Blood pressure 117/82, pulse 87, temperature 97.9 F (36.6 C), height 5\' 9"  (1.753 m), weight 241 lb (109.3 kg), SpO2 96 %. Body mass index is 35.59 kg/m.  General: Cooperative, alert, well developed, in no acute distress. HEENT: Conjunctivae and lids unremarkable. Cardiovascular: Regular rhythm.  Lungs: Normal work of breathing. Neurologic: No focal deficits.   Lab Results  Component Value Date   CREATININE 0.61 10/12/2020   BUN 8 10/12/2020   NA 139 10/12/2020   K 4.6 10/12/2020   CL 106 10/12/2020   CO2 20 10/12/2020   Lab Results  Component Value Date   ALT 21 10/12/2020   AST  19 10/12/2020   ALKPHOS 103 10/12/2020   BILITOT 0.3 10/12/2020   Lab Results  Component Value Date   HGBA1C 5.6 10/12/2020   HGBA1C 5.7 (H) 05/04/2020   HGBA1C 5.4 10/26/2019   HGBA1C 5.5 06/08/2019   HGBA1C 5.5 12/03/2018   Lab Results  Component Value Date   INSULIN 39.4 (H) 10/12/2020   INSULIN 31.0 (H) 05/04/2020   INSULIN 36.1 (H) 10/26/2019   INSULIN 33.7 (H) 06/08/2019   INSULIN 42.1 (H) 12/03/2018   Lab Results  Component Value Date   TSH 1.260 05/04/2020   Lab Results  Component Value Date   CHOL 180 10/12/2020   HDL 39 (L) 10/12/2020   LDLCALC 99 10/12/2020   TRIG 246 (H) 10/12/2020    CHOLHDL 4.6 (H) 10/12/2020   Lab Results  Component Value Date   WBC 9.8 12/03/2018   HGB 13.8 12/03/2018   HCT 42.9 12/03/2018   MCV 90 12/03/2018   PLT 445.0 (H) 09/03/2017   Attestation Statements:   Reviewed by clinician on day of visit: allergies, medications, problem list, medical history, surgical history, family history, social history, and previous encounter notes.  I, Insurance claims handler, CMA, am acting as Energy manager for William Hamburger, NP.  I have reviewed the above documentation for accuracy and completeness, and I agree with the above. -  Shelia Magallon d. Jordain Radin, NP-C

## 2020-10-14 DIAGNOSIS — F988 Other specified behavioral and emotional disorders with onset usually occurring in childhood and adolescence: Secondary | ICD-10-CM | POA: Insufficient documentation

## 2020-10-14 DIAGNOSIS — F909 Attention-deficit hyperactivity disorder, unspecified type: Secondary | ICD-10-CM | POA: Insufficient documentation

## 2020-10-24 ENCOUNTER — Encounter (HOSPITAL_COMMUNITY): Payer: Self-pay | Admitting: Psychiatry

## 2020-10-24 ENCOUNTER — Telehealth (INDEPENDENT_AMBULATORY_CARE_PROVIDER_SITE_OTHER): Payer: 59 | Admitting: Psychiatry

## 2020-10-24 DIAGNOSIS — F900 Attention-deficit hyperactivity disorder, predominantly inattentive type: Secondary | ICD-10-CM | POA: Diagnosis not present

## 2020-10-24 DIAGNOSIS — F411 Generalized anxiety disorder: Secondary | ICD-10-CM | POA: Diagnosis not present

## 2020-10-24 DIAGNOSIS — F5102 Adjustment insomnia: Secondary | ICD-10-CM

## 2020-10-24 MED ORDER — AMPHETAMINE-DEXTROAMPHETAMINE 10 MG PO TABS: 10.0000 mg | ORAL_TABLET | Freq: Every day | ORAL | 0 refills | Status: DC

## 2020-10-24 NOTE — Progress Notes (Signed)
[facility] Follow up visit  Patient Identification: Miranda Hunter MRN:  [id number] Date of Evaluation:  10/24/2020 Referral Source: primary care Chief Complaint:  inattention, Establish care Visit Diagnosis:    ICD-10-CM   1. GAD (generalized anxiety disorder)  F41.1   2. Adjustment insomnia  F51.02   3. Attention deficit hyperactivity disorder (ADHD), predominantly inattentive type  F90.0      I connected with [name] on 10/24/20 at 10:30 AM EST by a video enabled telemedicine application and verified that I am speaking with the correct person using two identifiers.  Location: Patient: home Provider: home office   I discussed the limitations of evaluation and management by telemedicine and the availability of in person appointments. The patient expressed understanding and agreed to proceed.   History of Present Illness: 34  years old single White female referred by PCP for management of inattention. Patient has been a part of weight maintenance program and has been on Wellbutrin and Strattera at that still did not help the inattention   Aggravating factors; history of ADHD.  Grandmother death Modifying factors; dog, her job, family  Duration ADHD since young age  Severity affecting job affecting relationship and causing anxiety  Denies past psych admission denies past psychotic symptoms manic symptoms or suicide attempt       Past Psychiatric History: anxiety, inattention  Previous Psychotropic Medications: Yes   Substance Abuse History in the last 12 months:  No.  Consequences of Substance Abuse: NA  Past Medical History:  Past Medical History:  Diagnosis Date   Anxiety    Asthma    History of chicken pox    IBS (irritable bowel syndrome)    Migraines     Past Surgical History:  Procedure Laterality Date   tonsillectomy and adnoidectomy Bilateral 2007    Family Psychiatric History: denies  Family History:  Family History  Problem  Relation Age of Onset   Migraines Mother    Prostate cancer Father    Hypertension Father    Asthma Sister    Lung cancer Maternal Grandmother        heavy smoker   Migraines Maternal Grandmother    Lung cancer Maternal Grandfather        heavy smoker   Asthma Sister     Social History:   Social History   Socioeconomic History   Marital status: Single    Spouse name: Not on file   Number of children: 0   Years of education: Not on file   Highest education level: Bachelor's degree (e.g., BA, AB, BS)  Occupational History   Not on file  Tobacco Use   Smoking status: Never Smoker   Smokeless tobacco: Never Used  Vaping Use   Vaping Use: Never used  Substance and Sexual Activity   Alcohol use: Yes    Comment: socially   Drug use: No   Sexual activity: Not Currently    Comment: kyleena  Other Topics Concern   Not on file  Social History Narrative   She is a Corporate investment banker   "[name]"   Recently got a new puppy- a Advertising account planner named [name]   Caffeine once a week      Social Determinants of Corporate investment banker Strain:    Difficulty of Paying Living Expenses: Not on file  Food Insecurity:    Worried About Programme researcher, broadcasting/film/video in the Last Year: Not on file   [employer] of Food in the Last Year: Not on  file  Transportation Needs:    Freight forwarder (Medical): Not on file   Lack of Transportation (Non-Medical): Not on file  Physical Activity:    Days of Exercise per Week: Not on file   Minutes of Exercise per Session: Not on file  Stress:    Feeling of Stress : Not on file  Social Connections:    Frequency of Communication with Friends and Family: Not on file   Frequency of Social Gatherings with Friends and Family: Not on file   Attends Religious Services: Not on file   Active Member of Clubs or Organizations: Not on file   Attends Banker Meetings: Not on file   Marital Status: Not on file     Allergies:    Allergies  Allergen Reactions   Amoxicillin-Pot Clavulanate Diarrhea    Metabolic Disorder Labs: Lab Results  Component Value Date   HGBA1C 5.6 10/12/2020   No results found for: PROLACTIN Lab Results  Component Value Date   CHOL 180 10/12/2020   TRIG 246 (H) 10/12/2020   HDL 39 (L) 10/12/2020   CHOLHDL 4.6 (H) 10/12/2020   LDLCALC 99 10/12/2020   LDLCALC 112 (H) 05/04/2020   Lab Results  Component Value Date   TSH 1.260 05/04/2020    Therapeutic Level Labs: No results found for: LITHIUM No results found for: CBMZ No results found for: VALPROATE  Current Medications: Current Outpatient Medications  Medication Sig Dispense Refill   albuterol (VENTOLIN HFA) 108 (90 Base) MCG/ACT inhaler Inhale 2 puffs into the lungs every 6 (six) hours as needed for wheezing or shortness of breath. 18 g 2   amphetamine-dextroamphetamine (ADDERALL) 10 MG tablet Take 1 tablet (10 mg total) by mouth daily. 30 tablet 0   azithromycin (ZITHROMAX Z-PAK) 250 MG tablet Take 2 tablets ( total of 500 mg) PO on day 1, then 1 tablet ( total of 250 mg) PO q24 x 4 days. 6 each 0   cetirizine (ZYRTEC) 10 MG tablet Take 10 mg by mouth daily.     escitalopram (LEXAPRO) 20 MG tablet Take 1 tablet (20 mg total) by mouth daily. 30 tablet 0   Flaxseed, Linseed, (FLAX SEEDS PO) Take 1 tablet by mouth at bedtime.     fluticasone (FLONASE) 50 MCG/ACT nasal spray      ketorolac (TORADOL) 10 MG tablet Take 1 tablet (10 mg total) by mouth every 6 (six) hours as needed. 20 tablet 11   metFORMIN (GLUCOPHAGE) 500 MG tablet Take 1 tablet (500 mg total) by mouth 2 (two) times daily with a meal. 60 tablet 0   Multiple Vitamin (MULTIVITAMIN) tablet Take 1 tablet by mouth daily.     ondansetron (ZOFRAN-ODT) 4 MG disintegrating tablet Take 1-2 tablets (4-8 mg total) by mouth every 8 (eight) hours as needed for nausea. 30 tablet 11   promethazine (PHENERGAN) 25 MG tablet Take 1 tablet (25 mg total) by mouth every  6 (six) hours as needed for nausea or vomiting. 30 tablet 6   rizatriptan (MAXALT-MLT) 10 MG disintegrating tablet Take 1 tablet (10 mg total) by mouth as needed for migraine. May repeat in 2 hours if needed 9 tablet 11   traZODone (DESYREL) 50 MG tablet Take 1 tablet (50 mg total) by mouth at bedtime. 30 tablet 0   No current facility-administered medications for this visit.      Psychiatric Specialty Exam: Review of Systems  Cardiovascular: Negative for chest pain.  Neurological: Negative for headaches.  Psychiatric/Behavioral: Negative  for decreased concentration.    There were no vitals taken for this visit.There is no height or weight on file to calculate BMI.  General Appearance: Casual  Eye Contact:  Fair  Speech:  Slow  Volume:  Normal  Mood:  better  Affect:  Congruent  Thought Process:  Goal Directed  Orientation:  Full (Time, Place, and Person)  Thought Content:  Logical  Suicidal Thoughts:  No  Homicidal Thoughts:  No  Memory:  Immediate;   Fair Recent;   Fair  Judgement:  Fair  Insight:  Fair  Psychomotor Activity:  Normal  Concentration:  Concentration: Fair and Attention Span: variable  Recall:  Good  Fund of Knowledge:Good  Language: Good  Akathisia:  No  Handed:   AIMS (if indicated):  not done  Assets:  Desire for Improvement Housing  ADL's:  Intact  Cognition: WNL  Sleep:  Fair   Screenings: PHQ2-9     Office Visit from 12/03/2018 in [facility] Office Visit from 09/03/2017 in [facility]  PHQ-2 Total Score 5 0  PHQ-9 Total Score 13 --      Assessment and Plan: as follows ADHD; improved, continue adderall 10mg  Still should consider sleep studey Work on increasing physical activity and having a regular structured day  Generalized anxiety disorder; she is already on Lexapro it is helping Insomnia; possible related to sleep apnea  But improved, continue trazadone I discussed the assessment and  treatment plan with the patient. The patient was provided an opportunity to ask questions and all were answered. The patient agreed with the plan and demonstrated an understanding of the instructions.   The patient was advised to call back or seek an in-person evaluation if the symptoms worsen or if the condition fails to improve as anticipated.  FU 55m  I provided 15  minutes of non-face-to-face time during this encounter.   3m, MD 11/29/202110:43 AM

## 2020-11-03 ENCOUNTER — Ambulatory Visit (INDEPENDENT_AMBULATORY_CARE_PROVIDER_SITE_OTHER): Payer: 59

## 2020-11-03 ENCOUNTER — Encounter: Payer: Self-pay | Admitting: Physician Assistant

## 2020-11-03 ENCOUNTER — Other Ambulatory Visit: Payer: Self-pay

## 2020-11-03 DIAGNOSIS — Z23 Encounter for immunization: Secondary | ICD-10-CM | POA: Diagnosis not present

## 2020-11-03 NOTE — Progress Notes (Signed)
Patient came into the office today to receive her flu shot. She tolerated the injection well in her left arm. No questions or concerns at this time.  

## 2020-11-05 ENCOUNTER — Other Ambulatory Visit (INDEPENDENT_AMBULATORY_CARE_PROVIDER_SITE_OTHER): Payer: Self-pay | Admitting: Adult Health

## 2020-11-05 DIAGNOSIS — F3289 Other specified depressive episodes: Secondary | ICD-10-CM

## 2020-11-09 ENCOUNTER — Other Ambulatory Visit: Payer: Self-pay

## 2020-11-09 ENCOUNTER — Encounter (INDEPENDENT_AMBULATORY_CARE_PROVIDER_SITE_OTHER): Payer: Self-pay | Admitting: Adult Health

## 2020-11-09 ENCOUNTER — Ambulatory Visit (INDEPENDENT_AMBULATORY_CARE_PROVIDER_SITE_OTHER): Payer: 59 | Admitting: Adult Health

## 2020-11-09 VITALS — BP 115/79 | HR 99 | Temp 97.6°F | Ht 69.0 in | Wt 247.0 lb

## 2020-11-09 DIAGNOSIS — E8881 Metabolic syndrome: Secondary | ICD-10-CM | POA: Diagnosis not present

## 2020-11-09 DIAGNOSIS — Z9189 Other specified personal risk factors, not elsewhere classified: Secondary | ICD-10-CM | POA: Diagnosis not present

## 2020-11-09 DIAGNOSIS — G4709 Other insomnia: Secondary | ICD-10-CM

## 2020-11-09 DIAGNOSIS — F3289 Other specified depressive episodes: Secondary | ICD-10-CM

## 2020-11-09 DIAGNOSIS — E559 Vitamin D deficiency, unspecified: Secondary | ICD-10-CM | POA: Diagnosis not present

## 2020-11-09 DIAGNOSIS — R7401 Elevation of levels of liver transaminase levels: Secondary | ICD-10-CM

## 2020-11-09 DIAGNOSIS — E782 Mixed hyperlipidemia: Secondary | ICD-10-CM

## 2020-11-09 DIAGNOSIS — Z6836 Body mass index (BMI) 36.0-36.9, adult: Secondary | ICD-10-CM

## 2020-11-09 MED ORDER — TRAZODONE HCL 50 MG PO TABS: 50.0000 mg | ORAL_TABLET | Freq: Every day | ORAL | 0 refills | Status: DC

## 2020-11-09 MED ORDER — ESCITALOPRAM OXALATE 20 MG PO TABS: 20.0000 mg | ORAL_TABLET | Freq: Every day | ORAL | 0 refills | Status: DC

## 2020-11-09 NOTE — Progress Notes (Signed)
Chief Complaint:   OBESITY Miranda Hunter is here to discuss her progress with her obesity treatment plan along with follow-up of her obesity related diagnoses. Miranda Hunter is on the Category 3 Plan and states she is following her eating plan approximately 80% of the time. Miranda Hunter states she is walking 30 minutes 2 times per week.  Today's visit was #: 33 Starting weight: 257 lbs Starting date: 12/03/2018 Today's weight: 247 lbs Today's date: 11/09/2020 Total lbs lost to date: 10 Total lbs lost since last in-office visit: 0  Interim History: Miranda Hunter was in [place] for a week for Thanksgiving and then celebrated her cousin's wedding. She ate healthy and walked frequently, however, liquid calories were high - wine/cocktails during celebrations. She plans on a "dry January."  Subjective:   Insulin resistance. Miranda Hunter has a diagnosis of insulin resistance based on her elevated fasting insulin level >5. She continues to work on diet and exercise to decrease her risk of diabetes. 10/12/2020 blood glucose and A1c levels within normal limits with insulin level worsening at 39.4. Miranda Hunter has not been taking metformin consistently. Labs were discussed with the patient today.   Lab Results  Component Value Date   INSULIN 39.4 (H) 10/12/2020   INSULIN 31.0 (H) 05/04/2020   INSULIN 36.1 (H) 10/26/2019   INSULIN 33.7 (H) 06/08/2019   INSULIN 42.1 (H) 12/03/2018   Lab Results  Component Value Date   HGBA1C 5.6 10/12/2020   Other insomnia. Miranda Hunter reports being able to fall and remain asleep with Trazodone 50 mg QHS. She denies morning sedation.  Other depression, with emotional eating. Miranda Hunter is struggling with emotional eating and using food for comfort to the extent that it is negatively impacting her health. She has been working on behavior modification techniques to help reduce her emotional eating and has been somewhat successful. She shows no sign of suicidal or homicidal ideations.  Miranda Hunter established with Behavioral Health, Dr. Gilmore Laroche, on 10/24/2020. She was continued on Adderall 10 mg daily for ADHD (PDMP verified with last refill 10/24/2020) and continued on escitalopram for GAD. She is to follow-up with Dr. Gilmore Laroche in 2 months.  Hyperlipidemia, mixed. Miranda Hunter has hyperlipidemia and has been trying to improve her cholesterol levels with intensive lifestyle modification including a low saturated fat diet, exercise and weight loss. She denies any chest pain, claudication or myalgias. Lipid panel on 10/12/2020 showed low HDL and elevated triglycerides. Total and LDL cholesterol levels were both improved from last check.  Lab Results  Component Value Date   ALT 21 10/12/2020   AST 19 10/12/2020   ALKPHOS 103 10/12/2020   BILITOT 0.3 10/12/2020   Lab Results  Component Value Date   CHOL 180 10/12/2020   HDL 39 (L) 10/12/2020   LDLCALC 99 10/12/2020   TRIG 246 (H) 10/12/2020   CHOLHDL 4.6 (H) 10/12/2020   Vitamin D deficiency. Vitamin D level on 10/12/2020 was 52.2, at goal. Miranda Hunter was unable to tolerate Vitamin D supplementation, i.e., nausea.   Ref. Range 10/12/2020 09:27  Vitamin D, 25-Hydroxy Latest Ref Range: 30.0 - 100.0 ng/mL 52.2   Transaminitis. CMP on 10/12/2020 showed normal LFT's.  At risk for diabetes mellitus. Miranda Hunter is at higher than average risk for developing diabetes due to worsening insulin resistance and obesity.   Assessment/Plan:   Insulin resistance. Miranda Hunter will continue to work on weight loss, exercise, and decreasing simple carbohydrates to help decrease the risk of diabetes. Miranda Hunter agreed to follow-up with [place] as directed to closely monitor  her progress. She will set an alarm to take metformin 500 mg BID, no medication refill today.  Other insomnia. The problem of recurrent insomnia was discussed. Orders and follow up as documented in patient record. Counseling: Intensive lifestyle modifications are the first line treatment for  this issue. We discussed several lifestyle modifications today and she will continue to work on diet, exercise and weight loss efforts. Refill was given for traZODone (DESYREL) 50 MG tablet QHS PRN insomnia #30 with 0 refills.  Counseling  Limit or avoid alcohol, caffeinated beverages, and cigarettes, especially close to bedtime.   Do not eat a large meal or eat spicy foods right before bedtime. This can lead to digestive discomfort that can make it hard for you to sleep.  Keep a sleep diary to help you and your health care provider figure out what could be causing your insomnia.  . Make your bedroom a dark, comfortable place where it is easy to fall asleep. ? Put up shades or blackout curtains to block light from outside. ? Use a white noise machine to block noise. ? Keep the temperature cool. . Limit screen use before bedtime. This includes: ? Watching TV. ? Using your smartphone, tablet, or computer. . Stick to a routine that includes going to bed and waking up at the same times every day and night. This can help you fall asleep faster. Consider making a quiet activity, such as reading, part of your nighttime routine. . Try to avoid taking naps during the day so that you sleep better at night. . Get out of bed if you are still awake after 15 minutes of trying to sleep. Keep the lights down, but try reading or doing a quiet activity. When you feel sleepy, go back to bed.  Other depression, with emotional eating. Behavior modification techniques were discussed today to help Miranda Hunter deal with her emotional/non-hunger eating behaviors.  Orders and follow up as documented in patient record. Refill was given for escitalopram (LEXAPRO) 20 MG tablet daily #90 with 0 refills.  Hyperlipidemia, mixed. Cardiovascular risk and specific lipid/LDL goals reviewed.  We discussed several lifestyle modifications today and Miranda Hunter will continue to work on diet, exercise and weight loss efforts. Orders and  follow up as documented in patient record. She will decrease sugar, carbohydrates, and ETOH intake and will increase her exercise.  Counseling Intensive lifestyle modifications are the first line treatment for this issue. . Dietary changes: Increase soluble fiber. Decrease simple carbohydrates. . Exercise changes: Moderate to vigorous-intensity aerobic activity 150 minutes per week if tolerated. . Lipid-lowering medications: see documented in medical record.  Vitamin D deficiency. Low Vitamin D level contributes to fatigue and are associated with obesity, breast, and colon cancer. She agrees to continue to take OTC multivitamin as directed and will follow-up for routine testing of Vitamin D, at least 2-3 times per year to avoid over-replacement.  Transaminitis. Miranda Hunter will continue to increase exercise and avoid acetaminophen/ETOH use.  At risk for diabetes mellitus. Miranda Hunter was given approximately 30 minutes of diabetes education and counseling today. We discussed intensive lifestyle modifications today with an emphasis on weight loss as well as increasing exercise and decreasing simple carbohydrates in her diet. We also reviewed medication options with an emphasis on risk versus benefit of those discussed.   Repetitive spaced learning was employed today to elicit superior memory formation and behavioral change.  Class 2 severe obesity with serious comorbidity and body mass index (BMI) of 36.0 to 36.9 in adult, unspecified obesity  type (HCC).  Miranda Hunter is currently in the action stage of change. As such, her goal is to continue with weight loss efforts. She has agreed to the Category 3 Plan.   Exercise goals: Miranda Hunter will continue walking 30 minutes 2 times per week.  Behavioral modification strategies: increasing lean protein intake, decreasing simple carbohydrates, decreasing liquid calories, meal planning and cooking strategies and planning for success.  Miranda Hunter has agreed to  follow-up with our clinic in 4 weeks. She was informed of the importance of frequent follow-up visits to maximize her success with intensive lifestyle modifications for her multiple health conditions.   Objective:   Blood pressure 115/79, pulse 99, temperature 97.6 F (36.4 C), height 5\' 9"  (1.753 m), weight 247 lb (112 kg), SpO2 96 %. Body mass index is 36.48 kg/m.  General: Cooperative, alert, well developed, in no acute distress. HEENT: Conjunctivae and lids unremarkable. Cardiovascular: Regular rhythm.  Lungs: Normal work of breathing. Neurologic: No focal deficits.   Lab Results  Component Value Date   CREATININE 0.61 10/12/2020   BUN 8 10/12/2020   NA 139 10/12/2020   K 4.6 10/12/2020   CL 106 10/12/2020   CO2 20 10/12/2020   Lab Results  Component Value Date   ALT 21 10/12/2020   AST 19 10/12/2020   ALKPHOS 103 10/12/2020   BILITOT 0.3 10/12/2020   Lab Results  Component Value Date   HGBA1C 5.6 10/12/2020   HGBA1C 5.7 (H) 05/04/2020   HGBA1C 5.4 10/26/2019   HGBA1C 5.5 06/08/2019   HGBA1C 5.5 12/03/2018   Lab Results  Component Value Date   INSULIN 39.4 (H) 10/12/2020   INSULIN 31.0 (H) 05/04/2020   INSULIN 36.1 (H) 10/26/2019   INSULIN 33.7 (H) 06/08/2019   INSULIN 42.1 (H) 12/03/2018   Lab Results  Component Value Date   TSH 1.260 05/04/2020   Lab Results  Component Value Date   CHOL 180 10/12/2020   HDL 39 (L) 10/12/2020   LDLCALC 99 10/12/2020   TRIG 246 (H) 10/12/2020   CHOLHDL 4.6 (H) 10/12/2020   Lab Results  Component Value Date   WBC 9.8 12/03/2018   HGB 13.8 12/03/2018   HCT 42.9 12/03/2018   MCV 90 12/03/2018   PLT 445.0 (H) 09/03/2017   No results found for: IRON, TIBC, FERRITIN  Attestation Statements:   Reviewed by clinician on day of visit: allergies, medications, problem list, medical history, surgical history, family history, social history, and previous encounter notes.  I, 11/03/2017, am acting as Marianna Payment for  Energy manager, NP-C   I have reviewed the above documentation for accuracy and completeness, and I agree with the above. -  Denessa Cavan d. Jaiyanna Safran, NP-C

## 2020-11-16 ENCOUNTER — Ambulatory Visit (INDEPENDENT_AMBULATORY_CARE_PROVIDER_SITE_OTHER): Payer: 59 | Admitting: Psychology

## 2020-11-16 DIAGNOSIS — F321 Major depressive disorder, single episode, moderate: Secondary | ICD-10-CM | POA: Diagnosis not present

## 2020-11-16 DIAGNOSIS — F411 Generalized anxiety disorder: Secondary | ICD-10-CM | POA: Diagnosis not present

## 2020-12-01 ENCOUNTER — Telehealth (HOSPITAL_COMMUNITY): Payer: Self-pay

## 2020-12-01 MED ORDER — AMPHETAMINE-DEXTROAMPHETAMINE 10 MG PO TABS: 10.0000 mg | ORAL_TABLET | Freq: Every day | ORAL | 0 refills | Status: DC

## 2020-12-01 NOTE — Telephone Encounter (Signed)
Patient needs a refill on Adderall sent to [facility] on [address] in [place] 

## 2020-12-01 NOTE — Telephone Encounter (Signed)
sent 

## 2020-12-06 ENCOUNTER — Encounter (INDEPENDENT_AMBULATORY_CARE_PROVIDER_SITE_OTHER): Payer: Self-pay

## 2020-12-07 ENCOUNTER — Telehealth (INDEPENDENT_AMBULATORY_CARE_PROVIDER_SITE_OTHER): Payer: 59 | Admitting: Adult Health

## 2020-12-07 ENCOUNTER — Telehealth (INDEPENDENT_AMBULATORY_CARE_PROVIDER_SITE_OTHER): Payer: 59 | Admitting: Bariatrics

## 2020-12-07 DIAGNOSIS — Z6836 Body mass index (BMI) 36.0-36.9, adult: Secondary | ICD-10-CM

## 2020-12-07 DIAGNOSIS — E8881 Metabolic syndrome: Secondary | ICD-10-CM | POA: Diagnosis not present

## 2020-12-07 DIAGNOSIS — G4709 Other insomnia: Secondary | ICD-10-CM

## 2020-12-07 MED ORDER — TRAZODONE HCL 50 MG PO TABS: 50.0000 mg | ORAL_TABLET | Freq: Every day | ORAL | 0 refills | Status: DC

## 2020-12-08 ENCOUNTER — Other Ambulatory Visit: Payer: 59

## 2020-12-08 DIAGNOSIS — Z20822 Contact with and (suspected) exposure to covid-19: Secondary | ICD-10-CM

## 2020-12-11 LAB — NOVEL CORONAVIRUS, NAA: SARS-CoV-2, NAA: NOT DETECTED

## 2020-12-12 NOTE — Progress Notes (Signed)
TeleHealth Visit:  Due to the COVID-19 pandemic, this visit was completed with telemedicine (audio/video) technology to reduce patient and provider exposure as well as to preserve personal protective equipment.   Miranda Hunter has verbally consented to this TeleHealth visit. The patient is located at home, the provider is located at the [facility] office. The participants in this visit include the listed provider and patient. The visit was conducted today via MyChart video.  Chief Complaint: OBESITY Miranda Hunter is here to discuss her progress with her obesity treatment plan along with follow-up of her obesity related diagnoses. Miranda Hunter is on the Category 3 Plan and states she is following her eating plan approximately 80% of the time. Miranda Hunter states she is walking for 60 minutes 7 times per week.  Today's visit was #: 34 Starting weight: 257 lbs Starting date: 12/03/2018  Interim History: Miranda Hunter states that her weight remains the same.  She thinks that she has COVID-19.  Subjective:   1. Other insomnia Miranda Hunter is taking trazodone 50 mg at bedtime.  She says it is helpful for sleep.  2. Insulin resistance Miranda Hunter has a diagnosis of insulin resistance based on her elevated fasting insulin level >5. She continues to work on diet and exercise to decrease her risk of diabetes.  She says she has had some fluctuations in appetite.  She denies polyphagia.  Lab Results  Component Value Date   INSULIN 39.4 (H) 10/12/2020   INSULIN 31.0 (H) 05/04/2020   INSULIN 36.1 (H) 10/26/2019   INSULIN 33.7 (H) 06/08/2019   INSULIN 42.1 (H) 12/03/2018   Lab Results  Component Value Date   HGBA1C 5.6 10/12/2020   Assessment/Plan:   1. Other insomnia The problem of recurrent insomnia was discussed. Orders and follow up as documented in patient record. Counseling: Intensive lifestyle modifications are the first line treatment for this issue. We discussed several lifestyle modifications  today and she will continue to work on diet, exercise and weight loss efforts.  Will refill trazodone today, as per below.  Counseling  Limit or avoid alcohol, caffeinated beverages, and cigarettes, especially close to bedtime.   Do not eat a large meal or eat spicy foods right before bedtime. This can lead to digestive discomfort that can make it hard for you to sleep.  Keep a sleep diary to help you and your health care provider figure out what could be causing your insomnia.  . Make your bedroom a dark, comfortable place where it is easy to fall asleep. ? Put up shades or blackout curtains to block light from outside. ? Use a white noise machine to block noise. ? Keep the temperature cool. . Limit screen use before bedtime. This includes: ? Watching TV. ? Using your smartphone, tablet, or computer. . Stick to a routine that includes going to bed and waking up at the same times every day and night. This can help you fall asleep faster. Consider making a quiet activity, such as reading, part of your nighttime routine. . Try to avoid taking naps during the day so that you sleep better at night. . Get out of bed if you are still awake after 15 minutes of trying to sleep. Keep the lights down, but try reading or doing a quiet activity. When you feel sleepy, go back to bed.  -Refill traZODone (DESYREL) 50 MG tablet; Take 1 tablet (50 mg total) by mouth at bedtime.  Dispense: 30 tablet; Refill: 0  2. Insulin resistance Miranda Hunter will continue to  work on weight loss, exercise, and decreasing simple carbohydrates to help decrease the risk of diabetes. Miranda Hunter agreed to follow-up with [place] as directed to closely monitor her progress.   3. Class 2 severe obesity with serious comorbidity and body mass index (BMI) of 36.0 to 36.9 in adult, unspecified obesity type (HCC)  Miranda Hunter is currently in the action stage of change. As such, her goal is to continue with weight loss efforts. She has agreed to  the Category 3 Plan.   She will work on meal planning and increasing her water and protein intake.  Exercise goals: Will continue to walk.  Behavioral modification strategies: increasing lean protein intake, decreasing simple carbohydrates, increasing vegetables, increasing water intake, decreasing eating out, no skipping meals, meal planning and cooking strategies, keeping healthy foods in the home, avoiding temptations and planning for success.  Miranda Hunter has agreed to follow-up with our clinic in 2-3 weeks with William Hamburger, NP. She was informed of the importance of frequent follow-up visits to maximize her success with intensive lifestyle modifications for her multiple health conditions.  Objective:   VITALS: Per patient if applicable, see vitals. GENERAL: Alert and in no acute distress. CARDIOPULMONARY: No increased WOB. Speaking in clear sentences.  PSYCH: Pleasant and cooperative. Speech normal rate and rhythm. Affect is appropriate. Insight and judgement are appropriate. Attention is focused, linear, and appropriate.  NEURO: Oriented as arrived to appointment on time with no prompting.   Lab Results  Component Value Date   CREATININE 0.61 10/12/2020   BUN 8 10/12/2020   NA 139 10/12/2020   K 4.6 10/12/2020   CL 106 10/12/2020   CO2 20 10/12/2020   Lab Results  Component Value Date   ALT 21 10/12/2020   AST 19 10/12/2020   ALKPHOS 103 10/12/2020   BILITOT 0.3 10/12/2020   Lab Results  Component Value Date   HGBA1C 5.6 10/12/2020   HGBA1C 5.7 (H) 05/04/2020   HGBA1C 5.4 10/26/2019   HGBA1C 5.5 06/08/2019   HGBA1C 5.5 12/03/2018   Lab Results  Component Value Date   INSULIN 39.4 (H) 10/12/2020   INSULIN 31.0 (H) 05/04/2020   INSULIN 36.1 (H) 10/26/2019   INSULIN 33.7 (H) 06/08/2019   INSULIN 42.1 (H) 12/03/2018   Lab Results  Component Value Date   TSH 1.260 05/04/2020   Lab Results  Component Value Date   CHOL 180 10/12/2020   HDL 39 (L) 10/12/2020    LDLCALC 99 10/12/2020   TRIG 246 (H) 10/12/2020   CHOLHDL 4.6 (H) 10/12/2020   Lab Results  Component Value Date   WBC 9.8 12/03/2018   HGB 13.8 12/03/2018   HCT 42.9 12/03/2018   MCV 90 12/03/2018   PLT 445.0 (H) 09/03/2017   Attestation Statements:   Reviewed by clinician on day of visit: allergies, medications, problem list, medical history, surgical history, family history, social history, and previous encounter notes.  I, Insurance claims handler, CMA, am acting as Energy manager for [employer], DO  I have reviewed the above documentation for accuracy and completeness, and I agree with the above. Corinna Capra, DO

## 2020-12-14 ENCOUNTER — Encounter (INDEPENDENT_AMBULATORY_CARE_PROVIDER_SITE_OTHER): Payer: Self-pay | Admitting: Bariatrics

## 2020-12-15 ENCOUNTER — Encounter: Payer: Self-pay | Admitting: Physician Assistant

## 2020-12-15 ENCOUNTER — Other Ambulatory Visit: Payer: Self-pay

## 2020-12-15 ENCOUNTER — Ambulatory Visit (INDEPENDENT_AMBULATORY_CARE_PROVIDER_SITE_OTHER): Payer: 59

## 2020-12-15 ENCOUNTER — Ambulatory Visit: Payer: 59 | Admitting: Physician Assistant

## 2020-12-15 VITALS — BP 124/80 | HR 110 | Temp 98.2°F | Ht 69.0 in | Wt 253.2 lb

## 2020-12-15 DIAGNOSIS — M25551 Pain in right hip: Secondary | ICD-10-CM | POA: Diagnosis not present

## 2020-12-15 MED ORDER — KETOROLAC TROMETHAMINE 10 MG PO TABS: 10.0000 mg | ORAL_TABLET | Freq: Four times a day (QID) | ORAL | 1 refills | Status: DC | PRN

## 2020-12-15 MED ORDER — KETOROLAC TROMETHAMINE 60 MG/2ML IM SOLN
60.0000 mg | Freq: Once | INTRAMUSCULAR | Status: AC
  Administered 2020-12-15: 60 mg via INTRAMUSCULAR

## 2020-12-15 MED ORDER — CYCLOBENZAPRINE HCL 10 MG PO TABS: 10.0000 mg | ORAL_TABLET | Freq: Three times a day (TID) | ORAL | 0 refills | Status: DC | PRN

## 2020-12-15 NOTE — Progress Notes (Signed)
Miranda Hunter is a 35 y.o. female here for a new problem.  I acted as a Neurosurgeon for [employer], PA-C Miranda Mull, LPN   History of Present Illness:   Chief Complaint  Patient presents with  . Hip Pain    HPI   Hip pain Pt fell on the ice on Tuesday, 2 days ago. She fell onto her R hip and is having ongoing R hip pain. She is applying ice and taking Advil 800 mg q 4-6 hrs no relief. Had some numbness on the first day but this has resolved. Has trouble bearing weight. Symptoms are not worsening with time. Has significant tenderness to R hip area that is radiating to lower R back. Denies prior   Past Medical History:  Diagnosis Date  . Anxiety   . Asthma   . History of chicken pox   . IBS (irritable bowel syndrome)   . Migraines      Social History   Tobacco Use  . Smoking status: Never Smoker  . Smokeless tobacco: Never Used  Vaping Use  . Vaping Use: Never used  Substance Use Topics  . Alcohol use: Yes    Comment: socially  . Drug use: No    Past Surgical History:  Procedure Laterality Date  . tonsillectomy and adnoidectomy Bilateral 2007    Family History  Problem Relation Age of Onset  . Migraines Mother   . Prostate cancer Father   . Hypertension Father   . Asthma Sister   . Lung cancer Maternal Grandmother        heavy smoker  . Migraines Maternal Grandmother   . Lung cancer Maternal Grandfather        heavy smoker  . Asthma Sister     Allergies  Allergen Reactions  . Amoxicillin-Pot Clavulanate Diarrhea    Current Medications:   Current Outpatient Medications:  .  albuterol (VENTOLIN HFA) 108 (90 Base) MCG/ACT inhaler, Inhale 2 puffs into the lungs every 6 (six) hours as needed for wheezing or shortness of breath., Disp: 18 g, Rfl: 2 .  amphetamine-dextroamphetamine (ADDERALL) 10 MG tablet, Take 1 tablet (10 mg total) by mouth daily., Disp: 30 tablet, Rfl: 0 .  augmented betamethasone dipropionate (DIPROLENE-AF) 0.05 % ointment, Apply  topically., Disp: , Rfl:  .  cetirizine (ZYRTEC) 10 MG tablet, Take 10 mg by mouth daily., Disp: , Rfl:  .  Clindamycin-Benzoyl Per, Refr, gel, Apply 1 application topically daily., Disp: , Rfl:  .  cyclobenzaprine (FLEXERIL) 10 MG tablet, Take 1 tablet (10 mg total) by mouth 3 (three) times daily as needed for muscle spasms., Disp: 30 tablet, Rfl: 0 .  escitalopram (LEXAPRO) 20 MG tablet, Take 1 tablet (20 mg total) by mouth daily., Disp: 90 tablet, Rfl: 0 .  Flaxseed, Linseed, (FLAX SEEDS PO), Take 1 tablet by mouth at bedtime., Disp: , Rfl:  .  fluticasone (FLONASE) 50 MCG/ACT nasal spray, , Disp: , Rfl:  .  medroxyPROGESTERone (DEPO-PROVERA) 150 MG/ML injection, , Disp: , Rfl:  .  metFORMIN (GLUCOPHAGE) 500 MG tablet, Take 1 tablet (500 mg total) by mouth 2 (two) times daily with a meal., Disp: 60 tablet, Rfl: 0 .  Multiple Vitamin (MULTIVITAMIN) tablet, Take 1 tablet by mouth daily., Disp: , Rfl:  .  mupirocin ointment (BACTROBAN) 2 %, SMARTSIG:1 Application Topical 2-3 Times Daily, Disp: , Rfl:  .  ondansetron (ZOFRAN-ODT) 4 MG disintegrating tablet, Take 1-2 tablets (4-8 mg total) by mouth every 8 (eight) hours as needed for  nausea., Disp: 30 tablet, Rfl: 11 .  promethazine (PHENERGAN) 25 MG tablet, Take 1 tablet (25 mg total) by mouth every 6 (six) hours as needed for nausea or vomiting., Disp: 30 tablet, Rfl: 6 .  rizatriptan (MAXALT-MLT) 10 MG disintegrating tablet, Take 1 tablet (10 mg total) by mouth as needed for migraine. May repeat in 2 hours if needed, Disp: 9 tablet, Rfl: 11 .  traZODone (DESYREL) 50 MG tablet, Take 1 tablet (50 mg total) by mouth at bedtime., Disp: 30 tablet, Rfl: 0 .  ketorolac (TORADOL) 10 MG tablet, Take 1 tablet (10 mg total) by mouth every 6 (six) hours as needed., Disp: 20 tablet, Rfl: 1  Current Facility-Administered Medications:  .  ketorolac (TORADOL) injection 60 mg, 60 mg, Intramuscular, Once, [place], [place], [place]   Review of Systems:   ROS   Negative unless otherwise specified per HPI.  Vitals:   Vitals:   12/15/20 1305  BP: 124/80  Pulse: (!) 110  Temp: 98.2 F (36.8 C)  TempSrc: Temporal  SpO2: 96%  Weight: 253 lb 4 oz (114.9 kg)  Height: 5\' 9"  (1.753 m)     Body mass index is 37.4 kg/m.  Physical Exam:   Physical Exam Constitutional:      Appearance: She is well-developed and well-nourished.  HENT:     Head: Normocephalic and atraumatic.  Eyes:     Extraocular Movements: EOM normal.     Conjunctiva/sclera: Conjunctivae normal.  Pulmonary:     Effort: Pulmonary effort is normal.  Musculoskeletal:        General: Normal range of motion.     Cervical back: Normal range of motion and neck supple.     Comments: R hip: TTP to R greater trochanteric area  ROM limited due to pain No visible deformity or obvious discrepancy in leg length Impaired gait due to pain  Skin:    General: Skin is warm and dry.  Neurological:     Mental Status: She is alert and oriented to person, place, and time.  Psychiatric:        Mood and Affect: Mood and affect normal.        Behavior: Behavior normal.        Thought Content: Thought content normal.        Judgment: Judgment normal.     Assessment and Plan:   Miranda Hunter was seen today for hip pain.  Diagnoses and all orders for this visit:  Right hip pain Xray performed and based on initial review, no evidence of acute fracture. Discussed with patient that we will update her with the official radiology read when this is available. Suspect hip contusion. No red flags, but these were reviewed with patient. Toradol injection today, she tolerated well. Oral toradol as needed starting tomorrow. Hold ibuprofen while on this medication. May take flexeril as needed, sleepy precautions advised. Consider PT as soon as next week if she would like. -     DG Hip Unilat W OR W/O Pelvis 2-3 Views Right; Future  Other orders -     cyclobenzaprine (FLEXERIL) 10 MG tablet; Take 1  tablet (10 mg total) by mouth 3 (three) times daily as needed for muscle spasms. -     ketorolac (TORADOL) 10 MG tablet; Take 1 tablet (10 mg total) by mouth every 6 (six) hours as needed.    CMA or LPN served as scribe during this visit. History, Physical, and Plan performed by medical provider. The above documentation has been reviewed  and is accurate and complete.   Inda Coke, PA-C

## 2020-12-15 NOTE — Patient Instructions (Signed)
It was great to see you!  Toradol injection today. May start oral toradol tomorrow. Hold ibuprofen if taking toradol.  Flexeril as needed.  Please message me or call me if you want to see our PT, Lauren, as soon as next week.  Take care,  Jarold Motto PA-C

## 2020-12-20 ENCOUNTER — Encounter: Payer: Self-pay | Admitting: Physician Assistant

## 2020-12-20 ENCOUNTER — Encounter (HOSPITAL_COMMUNITY): Payer: Self-pay | Admitting: Psychiatry

## 2020-12-20 ENCOUNTER — Other Ambulatory Visit: Payer: Self-pay | Admitting: Physician Assistant

## 2020-12-20 ENCOUNTER — Telehealth (INDEPENDENT_AMBULATORY_CARE_PROVIDER_SITE_OTHER): Payer: 59 | Admitting: Psychiatry

## 2020-12-20 DIAGNOSIS — F411 Generalized anxiety disorder: Secondary | ICD-10-CM

## 2020-12-20 DIAGNOSIS — F900 Attention-deficit hyperactivity disorder, predominantly inattentive type: Secondary | ICD-10-CM | POA: Diagnosis not present

## 2020-12-20 DIAGNOSIS — F5102 Adjustment insomnia: Secondary | ICD-10-CM

## 2020-12-20 MED ORDER — ONDANSETRON 4 MG PO TBDP: 4.0000 mg | ORAL_TABLET | Freq: Three times a day (TID) | ORAL | 1 refills | Status: DC | PRN

## 2020-12-20 NOTE — Progress Notes (Signed)
[facility] Follow up visit  Patient Identification: Miranda Hunter MRN:  [id number] Date of Evaluation:  12/20/2020 Referral Source: primary care Chief Complaint:  inattention, Establish care Visit Diagnosis:    ICD-10-CM   1. GAD (generalized anxiety disorder)  F41.1   2. Adjustment insomnia  F51.02   3. Attention deficit hyperactivity disorder (ADHD), predominantly inattentive type  F90.0      Virtual Visit via Video Note  I connected with Miranda Hunter on 12/20/20 at 10:30 AM EST by a video enabled telemedicine application and verified that I am speaking with the correct person using two identifiers.  Location: Patient: home  Provider: home office   I discussed the limitations of evaluation and management by telemedicine and the availability of in person appointments. The patient expressed understanding and agreed to proceed.      I discussed the assessment and treatment plan with the patient. The patient was provided an opportunity to ask questions and all were answered. The patient agreed with the plan and demonstrated an understanding of the instructions.   The patient was advised to call back or seek an in-person evaluation if the symptoms worsen or if the condition fails to improve as anticipated.  I provided 10  minutes of non-face-to-face time during this encounter.   Miranda Ross, MD    History of Present Illness: 35  years old single White female referred by PCP for management of inattention. Patient has been a part of weight maintenance program   She is doing fair on adderall and lexapro Denies depression. Takes drug holidays on adderall and focus and indirectly anxiety has improved Didn't get sleep study referral as sleep has improved and takes trazadone prn  Aggravating factors; history of ADHD.  Grandmother death Modifying factors; dog, her job, family   Duration ADHD since young age  Severity improved  Denies past psych admission denies past  psychotic symptoms manic symptoms or suicide attempt       Past Psychiatric History: anxiety, inattention  Previous Psychotropic Medications: Yes   Substance Abuse History in the last 12 months:  No.  Consequences of Substance Abuse: NA  Past Medical History:  Past Medical History:  Diagnosis Date  . Anxiety   . Asthma   . History of chicken pox   . IBS (irritable bowel syndrome)   . Migraines     Past Surgical History:  Procedure Laterality Date  . tonsillectomy and adnoidectomy Bilateral 2007    Family Psychiatric History: denies  Family History:  Family History  Problem Relation Age of Onset  . Migraines Mother   . Prostate cancer Father   . Hypertension Father   . Asthma Sister   . Lung cancer Maternal Grandmother        heavy smoker  . Migraines Maternal Grandmother   . Lung cancer Maternal Grandfather        heavy smoker  . Asthma Sister     Social History:   Social History   Socioeconomic History  . Marital status: Single    Spouse name: Not on file  . Number of children: 0  . Years of education: Not on file  . Highest education level: Bachelor's degree (e.g., BA, AB, BS)  Occupational History  . Not on file  Tobacco Use  . Smoking status: Never Smoker  . Smokeless tobacco: Never Used  Vaping Use  . Vaping Use: Never used  Substance and Sexual Activity  . Alcohol use: Yes    Comment: socially  . Drug  use: No  . Sexual activity: Not Currently    Comment: kyleena  Other Topics Concern  . Not on file  Social History Narrative   She is a Psychiatric nurseSam"   Recently got a new puppy- a Advertising account planner named [name]   Caffeine once a week      Social Determinants of Corporate investment banker Strain: Not on file  Food Insecurity: Not on file  Transportation Needs: Not on file  Physical Activity: Not on file  Stress: Not on file  Social Connections: Not on file     Allergies:   Allergies  Allergen Reactions  . Amoxicillin-Pot  Clavulanate Diarrhea    Metabolic Disorder Labs: Lab Results  Component Value Date   HGBA1C 5.6 10/12/2020   No results found for: PROLACTIN Lab Results  Component Value Date   CHOL 180 10/12/2020   TRIG 246 (H) 10/12/2020   HDL 39 (L) 10/12/2020   CHOLHDL 4.6 (H) 10/12/2020   LDLCALC 99 10/12/2020   LDLCALC 112 (H) 05/04/2020   Lab Results  Component Value Date   TSH 1.260 05/04/2020    Therapeutic Level Labs: No results found for: LITHIUM No results found for: CBMZ No results found for: VALPROATE  Current Medications: Current Outpatient Medications  Medication Sig Dispense Refill  . albuterol (VENTOLIN HFA) 108 (90 Base) MCG/ACT inhaler Inhale 2 puffs into the lungs every 6 (six) hours as needed for wheezing or shortness of breath. 18 g 2  . amphetamine-dextroamphetamine (ADDERALL) 10 MG tablet Take 1 tablet (10 mg total) by mouth daily. 30 tablet 0  . augmented betamethasone dipropionate (DIPROLENE-AF) 0.05 % ointment Apply topically.    . cetirizine (ZYRTEC) 10 MG tablet Take 10 mg by mouth daily.    . Clindamycin-Benzoyl Per, Refr, gel Apply 1 application topically daily.    . cyclobenzaprine (FLEXERIL) 10 MG tablet Take 1 tablet (10 mg total) by mouth 3 (three) times daily as needed for muscle spasms. 30 tablet 0  . escitalopram (LEXAPRO) 20 MG tablet Take 1 tablet (20 mg total) by mouth daily. 90 tablet 0  . Flaxseed, Linseed, (FLAX SEEDS PO) Take 1 tablet by mouth at bedtime.    . fluticasone (FLONASE) 50 MCG/ACT nasal spray     . ketorolac (TORADOL) 10 MG tablet Take 1 tablet (10 mg total) by mouth every 6 (six) hours as needed. 20 tablet 1  . medroxyPROGESTERone (DEPO-PROVERA) 150 MG/ML injection     . metFORMIN (GLUCOPHAGE) 500 MG tablet Take 1 tablet (500 mg total) by mouth 2 (two) times daily with a meal. 60 tablet 0  . Multiple Vitamin (MULTIVITAMIN) tablet Take 1 tablet by mouth daily.    . mupirocin ointment (BACTROBAN) 2 % SMARTSIG:1 Application Topical  2-3 Times Daily    . ondansetron (ZOFRAN-ODT) 4 MG disintegrating tablet Take 1-2 tablets (4-8 mg total) by mouth every 8 (eight) hours as needed for nausea. 30 tablet 11  . promethazine (PHENERGAN) 25 MG tablet Take 1 tablet (25 mg total) by mouth every 6 (six) hours as needed for nausea or vomiting. 30 tablet 6  . rizatriptan (MAXALT-MLT) 10 MG disintegrating tablet Take 1 tablet (10 mg total) by mouth as needed for migraine. May repeat in 2 hours if needed 9 tablet 11  . traZODone (DESYREL) 50 MG tablet Take 1 tablet (50 mg total) by mouth at bedtime. 30 tablet 0   No current facility-administered medications for this visit.      Psychiatric Specialty Exam:  Review of Systems  Cardiovascular: Negative for chest pain.  Neurological: Negative for headaches.  Psychiatric/Behavioral: Negative for decreased concentration.    There were no vitals taken for this visit.There is no height or weight on file to calculate BMI.  General Appearance: Casual  Eye Contact:  Fair  Speech:  Slow  Volume:  Normal  Mood:improved  Affect:  Congruent  Thought Process:  Goal Directed  Orientation:  Full (Time, Place, and Person)  Thought Content:  Logical  Suicidal Thoughts:  No  Homicidal Thoughts:  No  Memory:  Immediate;   Fair Recent;   Fair  Judgement:  Fair  Insight:  Fair  Psychomotor Activity:  Normal  Concentration:  Concentration: Fair and Attention Span: variable  Recall:  Good  Fund of Knowledge:Good  Language: Good  Akathisia:  No  Handed:   AIMS (if indicated):  not done  Assets:  Desire for Improvement Housing  ADL's:  Intact  Cognition: WNL  Sleep:  Fair   Screenings: PHQ2-9   Flowsheet Row Office Visit from 12/03/2018 in [facility] Office Visit from 09/03/2017 in [facility]  PHQ-2 Total Score 5 0  PHQ-9 Total Score 13 -      Assessment and Plan: as follows ADHD;doig stable continue adderall, take drug holidays Still should  consider sleep studey Work on increasing physical activity and having a regular structured day  Generalized anxiety disorder; fair on lexapro, continue  Insomnia; possible related to sleep apnea doing fair, continue trazdone, should consider sleep study  Fu 79m.   Miranda Ross, MD 1/25/202210:40 AM

## 2020-12-31 ENCOUNTER — Other Ambulatory Visit (INDEPENDENT_AMBULATORY_CARE_PROVIDER_SITE_OTHER): Payer: Self-pay | Admitting: Bariatrics

## 2020-12-31 DIAGNOSIS — G4709 Other insomnia: Secondary | ICD-10-CM

## 2021-01-02 ENCOUNTER — Telehealth (HOSPITAL_COMMUNITY): Payer: Self-pay

## 2021-01-02 MED ORDER — AMPHETAMINE-DEXTROAMPHETAMINE 10 MG PO TABS: 10.0000 mg | ORAL_TABLET | Freq: Every day | ORAL | 0 refills | Status: DC

## 2021-01-02 NOTE — Telephone Encounter (Signed)
Last seen by Dr. Brown. 

## 2021-01-02 NOTE — Telephone Encounter (Signed)
Patient needs a refill on Adderall sent to [facility] [address] in [place] °

## 2021-01-02 NOTE — Telephone Encounter (Signed)
sent 

## 2021-01-03 ENCOUNTER — Ambulatory Visit (INDEPENDENT_AMBULATORY_CARE_PROVIDER_SITE_OTHER): Payer: 59 | Admitting: Family Medicine

## 2021-01-04 ENCOUNTER — Encounter (INDEPENDENT_AMBULATORY_CARE_PROVIDER_SITE_OTHER): Payer: Self-pay | Admitting: Adult Health

## 2021-01-04 ENCOUNTER — Other Ambulatory Visit: Payer: Self-pay

## 2021-01-04 ENCOUNTER — Ambulatory Visit (INDEPENDENT_AMBULATORY_CARE_PROVIDER_SITE_OTHER): Payer: 59 | Admitting: Adult Health

## 2021-01-04 VITALS — BP 110/76 | HR 106 | Temp 97.7°F | Ht 69.0 in | Wt 249.0 lb

## 2021-01-04 DIAGNOSIS — Z9189 Other specified personal risk factors, not elsewhere classified: Secondary | ICD-10-CM

## 2021-01-04 DIAGNOSIS — Z6836 Body mass index (BMI) 36.0-36.9, adult: Secondary | ICD-10-CM

## 2021-01-04 DIAGNOSIS — G4709 Other insomnia: Secondary | ICD-10-CM | POA: Diagnosis not present

## 2021-01-04 DIAGNOSIS — F3289 Other specified depressive episodes: Secondary | ICD-10-CM

## 2021-01-04 MED ORDER — ESCITALOPRAM OXALATE 20 MG PO TABS: 20.0000 mg | ORAL_TABLET | Freq: Every day | ORAL | 0 refills | Status: DC

## 2021-01-04 MED ORDER — TRAZODONE HCL 50 MG PO TABS: 50.0000 mg | ORAL_TABLET | Freq: Every day | ORAL | 0 refills | Status: DC

## 2021-01-05 NOTE — Progress Notes (Signed)
Chief Complaint:   OBESITY Miranda Hunter is here to discuss her progress with her obesity treatment plan along with follow-up of her obesity related diagnoses. Miranda Hunter is on the Category 3 Plan and states she is following her eating plan approximately 50% of the time. Miranda Hunter states she is walking 60 minutes 7 times per week.  Today's visit was #: 35 Starting weight: 257 lbs Starting date: 12/03/2018 Today's weight: 249 lbs Today's date: 01/04/2021 Total lbs lost to date: 8 lbs Total lbs lost since last in-office visit: 0  Interim History: The last 6 weeks pt has experienced: 1. GI acute illness 2. Had to move Grandfather into assisted living- he suffered 2 falls at the new facility 3. She fell during inclement weather and injured her right hip- has PT for treatment,  4. Going through a break-up 5. Parent moved in for the month of January while she was recovering from musculoskeletal injury.  Subjective:   1. Other insomnia Pt denies any caffeine use. She has increased daily walking. She reports excellent quality of sleep with trazodone 50 mg at bedtime.  2. Other depression, with emotional eating Pt has been under a lot of stress from injury/illness and recent health decline of grandfather. She reports stable mood and denies suicidal or homicidal ideations. She is seeing therapist bi-weekly.  3. At risk for activity intolerance Miranda Hunter is at risk for exercise intolerance due to right hip injury and steadily increasing daily walking.  Assessment/Plan:   1. Other insomnia The problem of recurrent insomnia was discussed. Orders and follow up as documented in patient record. Counseling: Intensive lifestyle modifications are the first line treatment for this issue. We discussed several lifestyle modifications today and she will continue to work on diet, exercise and weight loss efforts.   Counseling  Limit or avoid alcohol, caffeinated beverages, and cigarettes, especially close  to bedtime.   Do not eat a large meal or eat spicy foods right before bedtime. This can lead to digestive discomfort that can make it hard for you to sleep.  Keep a sleep diary to help you and your health care provider figure out what could be causing your insomnia.  . Make your bedroom a dark, comfortable place where it is easy to fall asleep. ? Put up shades or blackout curtains to block light from outside. ? Use a white noise machine to block noise. ? Keep the temperature cool. . Limit screen use before bedtime. This includes: ? Watching TV. ? Using your smartphone, tablet, or computer. . Stick to a routine that includes going to bed and waking up at the same times every day and night. This can help you fall asleep faster. Consider making a quiet activity, such as reading, part of your nighttime routine. . Try to avoid taking naps during the day so that you sleep better at night. . Get out of bed if you are still awake after 15 minutes of trying to sleep. Keep the lights down, but try reading or doing a quiet activity. When you feel sleepy, go back to bed.  - traZODone (DESYREL) 50 MG tablet; Take 1 tablet (50 mg total) by mouth at bedtime.  Dispense: 30 tablet; Refill: 0  2. Other depression, with emotional eating Behavior modification techniques were discussed today to help Miranda Hunter deal with her emotional/non-hunger eating behaviors.  Orders and follow up as documented in patient record.   - escitalopram (LEXAPRO) 20 MG tablet; Take 1 tablet (20 mg total) by mouth daily.  Dispense: 90 tablet; Refill: 0  3. At risk for activity intolerance Miranda Hunter was given approximately 15 minutes of exercise intolerance counseling today. She is 35 y.o. female and has risk factors exercise intolerance including obesity. We discussed intensive lifestyle modifications today with an emphasis on specific weight loss instructions and strategies. Miranda Hunter will slowly increase activity as  tolerated.  Repetitive spaced learning was employed today to elicit superior memory formation and behavioral change.  4. Class 2 severe obesity with serious comorbidity and body mass index (BMI) of 36.0 to 36.9 in adult, unspecified obesity type (HCC) Miranda Hunter is currently in the action stage of change. As such, her goal is to continue with weight loss efforts. She has agreed to the Category 3 Plan.   Exercise goals: As is  Behavioral modification strategies: increasing lean protein intake, meal planning and cooking strategies and planning for success.  Miranda Hunter has agreed to follow-up with our clinic in 5 weeks. She was informed of the importance of frequent follow-up visits to maximize her success with intensive lifestyle modifications for her multiple health conditions.   Objective:   Blood pressure 110/76, pulse (!) 106, temperature 97.7 F (36.5 C), height 5\' 9"  (1.753 m), weight 249 lb (112.9 kg), SpO2 96 %. Body mass index is 36.77 kg/m.  General: Cooperative, alert, well developed, in no acute distress. HEENT: Conjunctivae and lids unremarkable. Cardiovascular: Regular rhythm.  Lungs: Normal work of breathing. Neurologic: No focal deficits.   Lab Results  Component Value Date   CREATININE 0.61 10/12/2020   BUN 8 10/12/2020   NA 139 10/12/2020   K 4.6 10/12/2020   CL 106 10/12/2020   CO2 20 10/12/2020   Lab Results  Component Value Date   ALT 21 10/12/2020   AST 19 10/12/2020   ALKPHOS 103 10/12/2020   BILITOT 0.3 10/12/2020   Lab Results  Component Value Date   HGBA1C 5.6 10/12/2020   HGBA1C 5.7 (H) 05/04/2020   HGBA1C 5.4 10/26/2019   HGBA1C 5.5 06/08/2019   HGBA1C 5.5 12/03/2018   Lab Results  Component Value Date   INSULIN 39.4 (H) 10/12/2020   INSULIN 31.0 (H) 05/04/2020   INSULIN 36.1 (H) 10/26/2019   INSULIN 33.7 (H) 06/08/2019   INSULIN 42.1 (H) 12/03/2018   Lab Results  Component Value Date   TSH 1.260 05/04/2020   Lab Results  Component  Value Date   CHOL 180 10/12/2020   HDL 39 (L) 10/12/2020   LDLCALC 99 10/12/2020   TRIG 246 (H) 10/12/2020   CHOLHDL 4.6 (H) 10/12/2020   Lab Results  Component Value Date   WBC 9.8 12/03/2018   HGB 13.8 12/03/2018   HCT 42.9 12/03/2018   MCV 90 12/03/2018   PLT 445.0 (H) 09/03/2017    Attestation Statements:   Reviewed by clinician on day of visit: allergies, medications, problem list, medical history, surgical history, family history, social history, and previous encounter notes.  11/03/2017, am acting as Edmund Hilda for Energy manager, NP.  I have reviewed the above documentation for accuracy and completeness, and I agree with the above. -  Gayatri Teasdale d. Harsh Trulock, NP-C

## 2021-01-11 ENCOUNTER — Encounter: Payer: Self-pay | Admitting: Physician Assistant

## 2021-01-12 ENCOUNTER — Telehealth (INDEPENDENT_AMBULATORY_CARE_PROVIDER_SITE_OTHER): Payer: 59 | Admitting: Family Medicine

## 2021-01-12 DIAGNOSIS — R059 Cough, unspecified: Secondary | ICD-10-CM | POA: Diagnosis not present

## 2021-01-12 DIAGNOSIS — R0981 Nasal congestion: Secondary | ICD-10-CM

## 2021-01-12 DIAGNOSIS — R0982 Postnasal drip: Secondary | ICD-10-CM | POA: Diagnosis not present

## 2021-01-12 MED ORDER — BENZONATATE 100 MG PO CAPS: 100.0000 mg | ORAL_CAPSULE | Freq: Three times a day (TID) | ORAL | 0 refills | Status: DC | PRN

## 2021-01-12 NOTE — Progress Notes (Signed)
Virtual Visit via Video Note  I connected with Miranda Hunter  on 01/12/21 at 10:20 AM EST by a video enabled telemedicine application and verified that I am speaking with the correct person using two identifiers.  Location patient: home, [place] Location provider:work or home office Persons participating in the virtual visit: patient, provider  I discussed the limitations of evaluation and management by telemedicine and the availability of in person appointments. The patient expressed understanding and agreed to proceed.   HPI:  Acute telemedicine visit for sore throat: -Onset: 4 days ago -did covid test initially which was negative -Symptoms include: nasal congestion, drainage, sore throat, cough at night, low grade temps, sinus pressure -Denies: body aches, SOB, CP,  NVD, known sick contacts, inability to eat/drink/get out bed, asthma symptoms -Pertinent past medical history: asthma, obesity -Pertinent medication allergies: amox-clav -COVID-19 vaccine status: had 2 doses  ROS: See pertinent positives and negatives per HPI.  Past Medical History:  Diagnosis Date  . Anxiety   . Asthma   . History of chicken pox   . IBS (irritable bowel syndrome)   . Migraines     Past Surgical History:  Procedure Laterality Date  . tonsillectomy and adnoidectomy Bilateral 2007     Current Outpatient Medications:  .  benzonatate (TESSALON PERLES) 100 MG capsule, Take 1 capsule (100 mg total) by mouth 3 (three) times daily as needed., Disp: 20 capsule, Rfl: 0 .  albuterol (VENTOLIN HFA) 108 (90 Base) MCG/ACT inhaler, Inhale 2 puffs into the lungs every 6 (six) hours as needed for wheezing or shortness of breath., Disp: 18 g, Rfl: 2 .  amphetamine-dextroamphetamine (ADDERALL) 10 MG tablet, Take 1 tablet (10 mg total) by mouth daily., Disp: 30 tablet, Rfl: 0 .  augmented betamethasone dipropionate (DIPROLENE-AF) 0.05 % ointment, Apply topically., Disp: , Rfl:  .  cetirizine (ZYRTEC) 10 MG tablet, Take 10 mg  by mouth daily., Disp: , Rfl:  .  Clindamycin-Benzoyl Per, Refr, gel, Apply 1 application topically daily., Disp: , Rfl:  .  escitalopram (LEXAPRO) 20 MG tablet, Take 1 tablet (20 mg total) by mouth daily., Disp: 90 tablet, Rfl: 0 .  Flaxseed, Linseed, (FLAX SEEDS PO), Take 1 tablet by mouth at bedtime., Disp: , Rfl:  .  fluticasone (FLONASE) 50 MCG/ACT nasal spray, , Disp: , Rfl:  .  ketorolac (TORADOL) 10 MG tablet, Take 1 tablet (10 mg total) by mouth every 6 (six) hours as needed., Disp: 20 tablet, Rfl: 1 .  medroxyPROGESTERone (DEPO-PROVERA) 150 MG/ML injection, , Disp: , Rfl:  .  metFORMIN (GLUCOPHAGE) 500 MG tablet, Take 1 tablet (500 mg total) by mouth 2 (two) times daily with a meal., Disp: 60 tablet, Rfl: 0 .  Multiple Vitamin (MULTIVITAMIN) tablet, Take 1 tablet by mouth daily., Disp: , Rfl:  .  mupirocin ointment (BACTROBAN) 2 %, SMARTSIG:1 Application Topical 2-3 Times Daily, Disp: , Rfl:  .  ondansetron (ZOFRAN-ODT) 4 MG disintegrating tablet, Take 1-2 tablets (4-8 mg total) by mouth every 8 (eight) hours as needed for nausea., Disp: 30 tablet, Rfl: 1 .  promethazine (PHENERGAN) 25 MG tablet, Take 1 tablet (25 mg total) by mouth every 6 (six) hours as needed for nausea or vomiting., Disp: 30 tablet, Rfl: 6 .  rizatriptan (MAXALT-MLT) 10 MG disintegrating tablet, Take 1 tablet (10 mg total) by mouth as needed for migraine. May repeat in 2 hours if needed, Disp: 9 tablet, Rfl: 11 .  traZODone (DESYREL) 50 MG tablet, Take 1 tablet (50 mg total) by  mouth at bedtime., Disp: 30 tablet, Rfl: 0  EXAM:  VITALS per patient if applicable:  GENERAL: alert, oriented, appears well and in no acute distress  HEENT: atraumatic, conjunttiva clear, no obvious abnormalities on inspection of external nose and ears, moist mucous membranes, on video visit exam posterior oropharynx does have mild erythema, no appreciable tonsillar exudate or significant edema, does have postnasal drip, lots of nasal  congestion  NECK: normal movements of the head and neck  LUNGS: on inspection no signs of respiratory distress, breathing rate appears normal, no obvious gross SOB, gasping or wheezing  CV: no obvious cyanosis  MS: moves all visible extremities without noticeable abnormality  PSYCH/NEURO: pleasant and cooperative, no obvious depression or anxiety, speech and thought processing grossly intact  ASSESSMENT AND PLAN:  Discussed the following assessment and plan:  Nasal congestion  PND (post-nasal drip)  Cough  -we discussed possible serious and likely etiologies, options for evaluation and workup, limitations of telemedicine visit vs in person visit, treatment, treatment risks and precautions. Pt prefers to treat via telemedicine empirically rather than in person at this moment.  Query viral upper respiratory illness, possible Covid with false negative early testing, influenza versus other.  Discussed could repeat Covid testing, as we are seeing quite a few false negative test done early in the course of illness with the new variance.  Also discussed influenza testing, however at this point this diagnosis would not likely change treatment.  Opted for nasal saline, Tessalon Rx for cough, short course of nasal decongestant, advised use of her albuterol inhaler as needed per instructions.  Advised if Covid test positive, options for treatment, potential complications, isolation and precautions.  Advised if interested in treatment to schedule follow-up video visit if gets a positive Covid test and still within treatment window. Work/School slipped offered: Declined, works from home Scheduled follow up with PCP offered: Agrees to schedule follow-up at PCP office if needed or through [facility] health video visits over the weekend or in person at the urgent care. Advised to seek prompt in person care if worsening, new symptoms arise, or if is not improving with treatment. Discussed options for inperson care  if PCP office not available. Did let this patient know that I only do telemedicine on Tuesdays and Thursdays for [place]. Advised to schedule follow up visit with PCP or UCC if any further questions or concerns to avoid delays in care.   I discussed the assessment and treatment plan with the patient. The patient was provided an opportunity to ask questions and all were answered. The patient agreed with the plan and demonstrated an understanding of the instructions.     Terressa Koyanagi, DO

## 2021-01-12 NOTE — Patient Instructions (Addendum)
  HOME CARE TIPS:  -[place] COVID19 testing information: [email or web address] OR [phone] Most pharmacies also offer testing and home test kits.  -I sent the medication(s) we discussed to your pharmacy: Meds ordered this encounter  Medications  . benzonatate (TESSALON PERLES) 100 MG capsule    Sig: Take 1 capsule (100 mg total) by mouth 3 (three) times daily as needed.    Dispense:  20 capsule    Refill:  0     -If you have a positive Covid test and you are interested in treatment, please schedule a follow-up video visit through your primary care office or through [facility] health.  -Use your albuterol inhaler per instructions if needed for asthma symptoms.  Seek prompt in-person care if difficulty breathing, chest pain or symptoms not responding to your inhaler.  -can use tylenol or aleve if needed for fevers, sore throat, aches and pains per instructions  -can use nasal saline a few times per day if you have nasal congestion; sometimes  a short course of Afrin nasal spray for 3 days can help with symptoms as well  -stay hydrated, drink plenty of fluids and eat small healthy meals - avoid dairy  -can take 1000 IU ( ) Vit D3 and 100-500 mg of Vit C daily per instructions  -If the Covid test is positive, check out the CDC website for more information on home care, transmission and treatment for COVID19  -follow up with your doctor or seek in person care at an urgent care in 2-3 days unless improving and feeling better  -stay home while sick, except to seek medical care, and if you have COVID19 ideally it would be best to stay home for a full 10 days since the onset of symptoms PLUS one day of no fever and feeling better. Wear a good mask (such as N95 or KN95) if around others to reduce the risk of transmission.  It was nice to meet you today, and I really hope you are feeling better soon. I help [place] out with telemedicine visits on  Tuesdays and Thursdays and am available for visits on those days. If you have any concerns or questions following this visit please schedule a follow up visit with your Primary Care doctor or seek care at a local urgent care clinic to avoid delays in care.    Seek in person care or schedule a follow up video visit promptly if your symptoms worsen, new concerns arise or you are not improving with treatment. Call 911 and/or seek emergency care if your symptoms are severe or life threatening.

## 2021-01-30 ENCOUNTER — Telehealth (HOSPITAL_COMMUNITY): Payer: Self-pay

## 2021-01-30 MED ORDER — AMPHETAMINE-DEXTROAMPHETAMINE 10 MG PO TABS
10.0000 mg | ORAL_TABLET | Freq: Every day | ORAL | 0 refills | Status: DC
Start: 2021-01-30 — End: 2021-03-02

## 2021-01-30 NOTE — Telephone Encounter (Signed)
sent 

## 2021-01-30 NOTE — Telephone Encounter (Signed)
Patient needs a refill on [facility]derall sent to [facility] [address] in [place]

## 2021-02-05 ENCOUNTER — Encounter (INDEPENDENT_AMBULATORY_CARE_PROVIDER_SITE_OTHER): Payer: Self-pay | Admitting: Adult Health

## 2021-02-06 ENCOUNTER — Other Ambulatory Visit (INDEPENDENT_AMBULATORY_CARE_PROVIDER_SITE_OTHER): Payer: Self-pay | Admitting: Adult Health

## 2021-02-06 DIAGNOSIS — G4709 Other insomnia: Secondary | ICD-10-CM

## 2021-02-06 MED ORDER — TRAZODONE HCL 50 MG PO TABS: 50.0000 mg | ORAL_TABLET | Freq: Every day | ORAL | 0 refills | Status: DC

## 2021-02-06 NOTE — Telephone Encounter (Signed)
Last seen by Katy Danford. 

## 2021-02-06 NOTE — Telephone Encounter (Signed)
Refill request

## 2021-02-08 ENCOUNTER — Other Ambulatory Visit: Payer: Self-pay

## 2021-02-08 ENCOUNTER — Encounter (INDEPENDENT_AMBULATORY_CARE_PROVIDER_SITE_OTHER): Payer: Self-pay | Admitting: Adult Health

## 2021-02-08 ENCOUNTER — Ambulatory Visit (INDEPENDENT_AMBULATORY_CARE_PROVIDER_SITE_OTHER): Payer: 59 | Admitting: Adult Health

## 2021-02-08 VITALS — BP 112/78 | HR 61 | Temp 97.5°F | Ht 69.0 in | Wt 250.0 lb

## 2021-02-08 DIAGNOSIS — Z9189 Other specified personal risk factors, not elsewhere classified: Secondary | ICD-10-CM | POA: Diagnosis not present

## 2021-02-08 DIAGNOSIS — Z6837 Body mass index (BMI) 37.0-37.9, adult: Secondary | ICD-10-CM

## 2021-02-08 DIAGNOSIS — F909 Attention-deficit hyperactivity disorder, unspecified type: Secondary | ICD-10-CM | POA: Diagnosis not present

## 2021-02-08 DIAGNOSIS — R7303 Prediabetes: Secondary | ICD-10-CM

## 2021-02-08 DIAGNOSIS — R7401 Elevation of levels of liver transaminase levels: Secondary | ICD-10-CM

## 2021-02-08 DIAGNOSIS — E782 Mixed hyperlipidemia: Secondary | ICD-10-CM

## 2021-02-08 DIAGNOSIS — E6609 Other obesity due to excess calories: Secondary | ICD-10-CM

## 2021-02-08 DIAGNOSIS — E669 Obesity, unspecified: Secondary | ICD-10-CM

## 2021-02-08 DIAGNOSIS — E559 Vitamin D deficiency, unspecified: Secondary | ICD-10-CM | POA: Diagnosis not present

## 2021-02-09 LAB — COMPREHENSIVE METABOLIC PANEL
ALT: 19 IU/L (ref 0–32)
AST: 20 IU/L (ref 0–40)
Albumin/Globulin Ratio: 2.1 (ref 1.2–2.2)
Albumin: 4.6 g/dL (ref 3.8–4.8)
Alkaline Phosphatase: 108 IU/L (ref 44–121)
BUN/Creatinine Ratio: 9 (ref 9–23)
BUN: 5 mg/dL — ABNORMAL LOW (ref 6–20)
Bilirubin Total: 0.3 mg/dL (ref 0.0–1.2)
CO2: 19 mmol/L — ABNORMAL LOW (ref 20–29)
Calcium: 9.7 mg/dL (ref 8.7–10.2)
Chloride: 103 mmol/L (ref 96–106)
Creatinine, Ser: 0.56 mg/dL — ABNORMAL LOW (ref 0.57–1.00)
Globulin, Total: 2.2 g/dL (ref 1.5–4.5)
Glucose: 85 mg/dL (ref 65–99)
Potassium: 4.5 mmol/L (ref 3.5–5.2)
Sodium: 139 mmol/L (ref 134–144)
Total Protein: 6.8 g/dL (ref 6.0–8.5)
eGFR: 124 mL/min/{1.73_m2} (ref >59)

## 2021-02-09 LAB — LIPID PANEL
Chol/HDL Ratio: 4.7 ratio — ABNORMAL HIGH (ref 0.0–4.4)
Cholesterol, Total: 170 mg/dL (ref 100–199)
HDL: 36 mg/dL — ABNORMAL LOW (ref >39)
LDL Chol Calc (NIH): 104 mg/dL — ABNORMAL HIGH (ref 0–99)
Triglycerides: 168 mg/dL — ABNORMAL HIGH (ref 0–149)
VLDL Cholesterol Cal: 30 mg/dL (ref 5–40)

## 2021-02-09 LAB — INSULIN, RANDOM: INSULIN: 38.2 u[IU]/mL — ABNORMAL HIGH (ref 2.6–24.9)

## 2021-02-09 LAB — VITAMIN D 25 HYDROXY (VIT D DEFICIENCY, FRACTURES): Vit D, 25-Hydroxy: 48 ng/mL (ref 30.0–100.0)

## 2021-02-09 LAB — HEMOGLOBIN A1C
Est. average glucose Bld gHb Est-mCnc: 120 mg/dL
Hgb A1c MFr Bld: 5.8 % — ABNORMAL HIGH (ref 4.8–5.6)

## 2021-02-09 NOTE — Progress Notes (Signed)
Chief Complaint:   OBESITY Miranda Hunter is here to discuss her progress with her obesity treatment plan along with follow-up of her obesity related diagnoses. Miranda Hunter is on the Category 3 Plan and states she is following her eating plan approximately 80% of the time. Miranda Hunter states she is walking 30 minutes 7 times per week.  Today's visit was #: 36 Starting weight: 257 lbs Starting date: 12/03/2018 Today's weight: 250 lbs Today's date: 02/08/2021 Total lbs lost to date: 7 lbs Total lbs lost since last in-office visit: 0  Interim History: Miranda Hunter recently traveled to [place] for 5 days. She has been home for 2 weeks. She will not follow Category 3 or journal when traveling. She will try to make smart food/drink choices. When travelling she reports frequent activity- walking, water sports.  Subjective:   1. Hyperlipidemia, mixed [facility] 10/12/2020 Lipid panel resulted a low HDL, elevated triglycerides, and LDL at goal. She is not on statin therapy.  2. Attention deficit hyperactivity disorder (ADHD), unspecified ADHD type Dr. Gilmore Laroche started Miranda Hunter on Adderall 10 mg QD for inattention symptoms.  3. Transaminitis Miranda Hunter denies RUQ pain or jaundice.  4. Vitamin D deficiency Miranda Hunter's Vitamin D level was 52.2 on 10/12/2020. She was taking prescription vitamin D 50,000 IU each week but unable to tolerate due to nausea. She is taking OTC multivitamin.   Ref. Range 10/12/2020 09:27  Vitamin D, 25-Hydroxy Latest Ref Range: 30.0 - 100.0 ng/mL 52.2   5. Pre-diabetes Miranda Hunter is on Metformin 500 mg BID. She is very inconsistent use of prescription.  6. At risk for impaired function of liver Miranda Hunter is at risk for impaired function of liver due to likely diagnosis of fatty liver as evidenced by recent elevated liver enzymes and obesity.   Assessment/Plan:   1. Hyperlipidemia, mixed Cardiovascular risk and specific lipid/LDL goals reviewed.  We discussed several lifestyle  modifications today and Miranda Hunter will continue to work on diet, exercise and weight loss efforts. Orders and follow up as documented in patient record. Check labs today.  Counseling Intensive lifestyle modifications are the first line treatment for this issue. . Dietary changes: Increase soluble fiber. Decrease simple carbohydrates. . Exercise changes: Moderate to vigorous-intensity aerobic activity 150 minutes per week if tolerated. . Lipid-lowering medications: see documented in medical record.  - Lipid panel  2. Attention deficit hyperactivity disorder (ADHD), unspecified ADHD type Continue Adderall and follow up with Dr. Gilmore Laroche.  3. Transaminitis Check labs today.  - Comprehensive metabolic panel  4. Vitamin D deficiency Low Vitamin D level contributes to fatigue and are associated with obesity, breast, and colon cancer. She agrees to continue to take OTC multivitamin and will follow-up for routine testing of Vitamin D, at least 2-3 times per year to avoid over-replacement. Check labs today.  - VITAMIN D 25 Hydroxy (Vit-D Deficiency, Fractures)  5. Pre-diabetes Miranda Hunter will continue to work on weight loss, exercise, and decreasing simple carbohydrates to help decrease the risk of diabetes. Restart Metformin 500 mg BID and set a timer as a reminder to take it. Check labs today. - Hemoglobin A1c - Insulin, random  6. At risk for impaired function of liver Miranda Hunter was given approximately 15 minutes of counseling today regarding prevention of impaired liver function. Miranda Hunter was educated about her risk of developing NASH or even liver failure and advised that the only proven treatment for NAFLD was weight loss of at least 5-10% of body weight.   7. Class 2 obesity due to excess calories with  body mass index (BMI) of 37.0 to 37.9 in adult, unspecified whether serious comorbidity present Miranda Hunter is currently in the action stage of change. As such, her goal is to continue with weight  loss efforts. She has agreed to the Category 3 Plan at home and practicing portion control and making smarter food choices, such as increasing vegetables and decreasing simple carbohydrates when traveling.   Exercise goals: As is  Behavioral modification strategies: increasing lean protein intake, decreasing simple carbohydrates, decreasing liquid calories, decreasing alcohol intake, no skipping meals, meal planning and cooking strategies and planning for success.  Miranda Hunter has agreed to follow-up with our clinic in 4 weeks. She was informed of the importance of frequent follow-up visits to maximize her success with intensive lifestyle modifications for her multiple health conditions.   Objective:   Blood pressure 112/78, pulse 61, temperature (!) 97.5 F (36.4 C), height 5\' 9"  (1.753 m), weight 250 lb (113.4 kg), SpO2 98 %. Body mass index is 36.92 kg/m.  General: Cooperative, alert, well developed, in no acute distress. HEENT: Conjunctivae and lids unremarkable. Cardiovascular: Regular rhythm.  Lungs: Normal work of breathing. Neurologic: No focal deficits.   Lab Results  Component Value Date   CREATININE 0.56 (L) 02/08/2021   BUN 5 (L) 02/08/2021   NA 139 02/08/2021   K 4.5 02/08/2021   CL 103 02/08/2021   CO2 19 (L) 02/08/2021   Lab Results  Component Value Date   ALT 19 02/08/2021   AST 20 02/08/2021   ALKPHOS 108 02/08/2021   BILITOT 0.3 02/08/2021   Lab Results  Component Value Date   HGBA1C 5.8 (H) 02/08/2021   HGBA1C 5.6 10/12/2020   HGBA1C 5.7 (H) 05/04/2020   HGBA1C 5.4 10/26/2019   HGBA1C 5.5 06/08/2019   Lab Results  Component Value Date   INSULIN WILL FOLLOW 02/08/2021   INSULIN 39.4 (H) 10/12/2020   INSULIN 31.0 (H) 05/04/2020   INSULIN 36.1 (H) 10/26/2019   INSULIN 33.7 (H) 06/08/2019   Lab Results  Component Value Date   TSH 1.260 05/04/2020   Lab Results  Component Value Date   CHOL 170 02/08/2021   HDL 36 (L) 02/08/2021   LDLCALC 104 (H)  02/08/2021   TRIG 168 (H) 02/08/2021   CHOLHDL 4.7 (H) 02/08/2021   Lab Results  Component Value Date   WBC 9.8 12/03/2018   HGB 13.8 12/03/2018   HCT 42.9 12/03/2018   MCV 90 12/03/2018   PLT 445.0 (H) 09/03/2017    Attestation Statements:   Reviewed by clinician on day of visit: allergies, medications, problem list, medical history, surgical history, family history, social history, and previous encounter notes.  11/03/2017, am acting as Edmund Hilda for Energy manager, NP.  I have reviewed the above documentation for accuracy and completeness, and I agree with the above. -  Jared Cahn d. Honore Wipperfurth, NP-C

## 2021-03-02 ENCOUNTER — Telehealth (HOSPITAL_COMMUNITY): Payer: Self-pay

## 2021-03-02 MED ORDER — AMPHETAMINE-DEXTROAMPHETAMINE 10 MG PO TABS: 10.0000 mg | ORAL_TABLET | Freq: Every day | ORAL | 0 refills | Status: DC

## 2021-03-02 NOTE — Telephone Encounter (Signed)
Ok sent!

## 2021-03-02 NOTE — Telephone Encounter (Signed)
Patient needs a refill [facility] to [facility], [address] in [place]

## 2021-03-09 ENCOUNTER — Telehealth (INDEPENDENT_AMBULATORY_CARE_PROVIDER_SITE_OTHER): Payer: 59 | Admitting: Adult Health

## 2021-03-09 ENCOUNTER — Other Ambulatory Visit: Payer: Self-pay

## 2021-03-09 ENCOUNTER — Encounter (INDEPENDENT_AMBULATORY_CARE_PROVIDER_SITE_OTHER): Payer: Self-pay

## 2021-03-09 DIAGNOSIS — Z6836 Body mass index (BMI) 36.0-36.9, adult: Secondary | ICD-10-CM | POA: Diagnosis not present

## 2021-03-09 DIAGNOSIS — G4709 Other insomnia: Secondary | ICD-10-CM | POA: Diagnosis not present

## 2021-03-09 MED ORDER — TRAZODONE HCL 50 MG PO TABS
50.0000 mg | ORAL_TABLET | Freq: Every day | ORAL | 0 refills | Status: DC
Start: 2021-03-09 — End: 2021-04-13

## 2021-03-13 NOTE — Progress Notes (Signed)
TeleHealth Visit:  Due to the COVID-19 pandemic, this visit was completed with telemedicine (audio/video) technology to reduce patient and provider exposure as well as to preserve personal protective equipment.   [name] has verbally consented to this TeleHealth visit. The patient is located at home, the provider is located at the [facility] office. The participants in this visit include the listed provider and patient. The visit was conducted today via video.    Chief Complaint: OBESITY Miranda Hunter is here to discuss her progress with her obesity treatment plan along with follow-up of her obesity related diagnoses. Miranda Hunter is on the Category 3 Plan and practicing portion control and making smarter food choices, such as increasing vegetables and decreasing simple carbohydrates and states she is following her eating plan approximately 90% of the time. Miranda Hunter states she is walking 60 minutes 7 times per week.  Today's visit was #: 37 Starting weight: 257 lbs Starting date: 12/03/2018  Interim History: Chad woke with diarrhea this morning. She denies abdominal pain or N/V. She reports consuming a lot of bell peppers, cucumbers, and carrots yesterday. She estimates to have eaten 4-6 cups of vegetables yesterday- more than normal for her.  She feels that has maintained her weight since last OV.  Subjective:   1. Other insomnia Miranda Hunter reports restful/quality sleep with trazodone 50 mg QHS. She denies morning sedation after use.  Assessment/Plan:   1. Other insomnia The problem of recurrent insomnia was discussed. Orders and follow up as documented in patient record. Counseling: Intensive lifestyle modifications are the first line treatment for this issue. We discussed several lifestyle modifications today and she will continue to work on diet, exercise and weight loss efforts.   Counseling  Limit or avoid alcohol, caffeinated beverages, and cigarettes, especially  close to bedtime.   Do not eat a large meal or eat spicy foods right before bedtime. This can lead to digestive discomfort that can make it hard for you to sleep.  Keep a sleep diary to help you and your health care provider figure out what could be causing your insomnia.  . Make your bedroom a dark, comfortable place where it is easy to fall asleep. ? Put up shades or blackout curtains to block light from outside. ? Use a white noise machine to block noise. ? Keep the temperature cool. . Limit screen use before bedtime. This includes: ? Watching TV. ? Using your smartphone, tablet, or computer. . Stick to a routine that includes going to bed and waking up at the same times every day and night. This can help you fall asleep faster. Consider making a quiet activity, such as reading, part of your nighttime routine. . Try to avoid taking naps during the day so that you sleep better at night. . Get out of bed if you are still awake after 15 minutes of trying to sleep. Keep the lights down, but try reading or doing a quiet activity. When you feel sleepy, go back to bed.  - traZODone (DESYREL) 50 MG tablet; Take 1 tablet (50 mg total) by mouth at bedtime.  Dispense: 30 tablet; Refill: 0  2. Class 2 severe obesity with serious comorbidity and body mass index (BMI) of 36.0 to 36.9 in adult, unspecified obesity type (HCC) Miranda Hunter is currently in the action stage of change. As such, her goal is to continue with weight loss efforts. She has agreed to the Category 3 Plan.   Exercise goals: As is  Behavioral modification strategies:  increasing lean protein intake, meal planning and cooking strategies and planning for success.  Miranda Hunter has agreed to follow-up with our clinic in 4 weeks. She was informed of the importance of frequent follow-up visits to maximize her success with intensive lifestyle modifications for her multiple health conditions.  Objective:   VITALS: Per patient if applicable, see  vitals. GENERAL: Alert and in no acute distress. CARDIOPULMONARY: No increased WOB. Speaking in clear sentences.  PSYCH: Pleasant and cooperative. Speech normal rate and rhythm. Affect is appropriate. Insight and judgement are appropriate. Attention is focused, linear, and appropriate.  NEURO: Oriented as arrived to appointment on time with no prompting.   Lab Results  Component Value Date   CREATININE 0.56 (L) 02/08/2021   BUN 5 (L) 02/08/2021   NA 139 02/08/2021   K 4.5 02/08/2021   CL 103 02/08/2021   CO2 19 (L) 02/08/2021   Lab Results  Component Value Date   ALT 19 02/08/2021   AST 20 02/08/2021   ALKPHOS 108 02/08/2021   BILITOT 0.3 02/08/2021   Lab Results  Component Value Date   HGBA1C 5.8 (H) 02/08/2021   HGBA1C 5.6 10/12/2020   HGBA1C 5.7 (H) 05/04/2020   HGBA1C 5.4 10/26/2019   HGBA1C 5.5 06/08/2019   Lab Results  Component Value Date   INSULIN 38.2 (H) 02/08/2021   INSULIN 39.4 (H) 10/12/2020   INSULIN 31.0 (H) 05/04/2020   INSULIN 36.1 (H) 10/26/2019   INSULIN 33.7 (H) 06/08/2019   Lab Results  Component Value Date   TSH 1.260 05/04/2020   Lab Results  Component Value Date   CHOL 170 02/08/2021   HDL 36 (L) 02/08/2021   LDLCALC 104 (H) 02/08/2021   TRIG 168 (H) 02/08/2021   CHOLHDL 4.7 (H) 02/08/2021   Lab Results  Component Value Date   WBC 9.8 12/03/2018   HGB 13.8 12/03/2018   HCT 42.9 12/03/2018   MCV 90 12/03/2018   PLT 445.0 (H) 09/03/2017   No results found for: IRON, TIBC, FERRITIN  Attestation Statements:   Reviewed by clinician on day of visit: allergies, medications, problem list, medical history, surgical history, family history, social history, and previous encounter notes.  Time spent on visit including pre-visit chart review and post-visit charting and care was 30 minutes.   Edmund Hilda, am acting as Energy manager for William Hamburger, NP.  I have reviewed the above documentation for accuracy and completeness, and I  agree with the above. - Kyree Fedorko d. Rosamaria Donn, NP-C

## 2021-03-15 ENCOUNTER — Encounter (HOSPITAL_COMMUNITY): Payer: Self-pay | Admitting: Psychiatry

## 2021-03-15 ENCOUNTER — Telehealth (INDEPENDENT_AMBULATORY_CARE_PROVIDER_SITE_OTHER): Payer: 59 | Admitting: Psychiatry

## 2021-03-15 DIAGNOSIS — F5102 Adjustment insomnia: Secondary | ICD-10-CM

## 2021-03-15 DIAGNOSIS — F411 Generalized anxiety disorder: Secondary | ICD-10-CM

## 2021-03-15 DIAGNOSIS — F900 Attention-deficit hyperactivity disorder, predominantly inattentive type: Secondary | ICD-10-CM | POA: Diagnosis not present

## 2021-03-15 NOTE — Progress Notes (Signed)
[facility] Follow up visit  Patient Identification: Miranda Hunter MRN:  [id number] Date of Evaluation:  03/15/2021 Referral Source: primary care Chief Complaint:  inattention, Establish care Visit Diagnosis:    ICD-10-CM   1. GAD (generalized anxiety disorder)  F41.1   2. Attention deficit hyperactivity disorder (ADHD), predominantly inattentive type  F90.0   3. Adjustment insomnia  F51.02     Virtual Visit via Video Note  I connected with Miranda Hunter on 03/15/21 at  2:00 PM EDT by a video enabled telemedicine application and verified that I am speaking with the correct person using two identifiers.  Location: Patient: home Provider: home office   I discussed the limitations of evaluation and management by telemedicine and the availability of in person appointments. The patient expressed understanding and agreed to proceed.     I discussed the assessment and treatment plan with the patient. The patient was provided an opportunity to ask questions and all were answered. The patient agreed with the plan and demonstrated an understanding of the instructions.   The patient was advised to call back or seek an in-person evaluation if the symptoms worsen or if the condition fails to improve as anticipated.  I provided 10  minutes of non-face-to-face time during this encounter including review and documentation.   Miranda Ross, MD      History of Present Illness: 35  years old single White female referred by PCP for management of inattention. Patient has been a part of weight maintenance program   Dong fair on adderall and lexapro  Stress level manageable, takes drug holidays      Aggravating factors; history of ADHD.  Grandmother death Modifying factors; dog, her job, family   Duration ADHD since young age  Severity improved  Denies past psych admission denies past psychotic symptoms manic symptoms or suicide attempt       Past Psychiatric History: anxiety,  inattention  Previous Psychotropic Medications: Yes   Substance Abuse History in the last 12 months:  No.  Consequences of Substance Abuse: NA  Past Medical History:  Past Medical History:  Diagnosis Date  . Anxiety   . Asthma   . History of chicken pox   . IBS (irritable bowel syndrome)   . Migraines     Past Surgical History:  Procedure Laterality Date  . tonsillectomy and adnoidectomy Bilateral 2007    Family Psychiatric History: denies  Family History:  Family History  Problem Relation Age of Onset  . Migraines Mother   . Prostate cancer Father   . Hypertension Father   . Asthma Sister   . Lung cancer Maternal Grandmother        heavy smoker  . Migraines Maternal Grandmother   . Lung cancer Maternal Grandfather        heavy smoker  . Asthma Sister     Social History:   Social History   Socioeconomic History  . Marital status: Single    Spouse name: Not on file  . Number of children: 0  . Years of education: Not on file  . Highest education level: Bachelor's degree (e.g., BA, AB, BS)  Occupational History  . Not on file  Tobacco Use  . Smoking status: Never Smoker  . Smokeless tobacco: Never Used  Vaping Use  . Vaping Use: Never used  Substance and Sexual Activity  . Alcohol use: Yes    Comment: socially  . Drug use: No  . Sexual activity: Not Currently    Comment: Miranda Hunter  Other Topics Concern  . Not on file  Social History Narrative   She is a Psychiatric nurseSam"   Recently got a new puppy- a Advertising account planner named [name]   Caffeine once a week      Social Determinants of Corporate investment banker Strain: Not on file  Food Insecurity: Not on file  Transportation Needs: Not on file  Physical Activity: Not on file  Stress: Not on file  Social Connections: Not on file     Allergies:   Allergies  Allergen Reactions  . Amoxicillin-Pot Clavulanate Diarrhea    Metabolic Disorder Labs: Lab Results  Component Value Date   HGBA1C  5.8 (H) 02/08/2021   No results found for: PROLACTIN Lab Results  Component Value Date   CHOL 170 02/08/2021   TRIG 168 (H) 02/08/2021   HDL 36 (L) 02/08/2021   CHOLHDL 4.7 (H) 02/08/2021   LDLCALC 104 (H) 02/08/2021   LDLCALC 99 10/12/2020   Lab Results  Component Value Date   TSH 1.260 05/04/2020    Therapeutic Level Labs: No results found for: LITHIUM No results found for: CBMZ No results found for: VALPROATE  Current Medications: Current Outpatient Medications  Medication Sig Dispense Refill  . albuterol (VENTOLIN HFA) 108 (90 Base) MCG/ACT inhaler Inhale 2 puffs into the lungs every 6 (six) hours as needed for wheezing or shortness of breath. 18 g 2  . amphetamine-dextroamphetamine (ADDERALL) 10 MG tablet Take 1 tablet (10 mg total) by mouth daily. 30 tablet 0  . augmented betamethasone dipropionate (DIPROLENE-AF) 0.05 % ointment Apply topically.    . benzonatate (TESSALON PERLES) 100 MG capsule Take 1 capsule (100 mg total) by mouth 3 (three) times daily as needed. 20 capsule 0  . cetirizine (ZYRTEC) 10 MG tablet Take 10 mg by mouth daily.    . Clindamycin-Benzoyl Per, Refr, gel Apply 1 application topically daily.    Marland Kitchen escitalopram (LEXAPRO) 20 MG tablet Take 1 tablet (20 mg total) by mouth daily. 90 tablet 0  . Flaxseed, Linseed, (FLAX SEEDS PO) Take 1 tablet by mouth at bedtime.    . fluticasone (FLONASE) 50 MCG/ACT nasal spray     . ketorolac (TORADOL) 10 MG tablet Take 1 tablet (10 mg total) by mouth every 6 (six) hours as needed. 20 tablet 1  . medroxyPROGESTERone (DEPO-PROVERA) 150 MG/ML injection     . metFORMIN (GLUCOPHAGE) 500 MG tablet Take 1 tablet (500 mg total) by mouth 2 (two) times daily with a meal. 60 tablet 0  . Multiple Vitamin (MULTIVITAMIN) tablet Take 1 tablet by mouth daily.    . mupirocin ointment (BACTROBAN) 2 % SMARTSIG:1 Application Topical 2-3 Times Daily    . ondansetron (ZOFRAN-ODT) 4 MG disintegrating tablet Take 1-2 tablets (4-8 mg total)  by mouth every 8 (eight) hours as needed for nausea. 30 tablet 1  . promethazine (PHENERGAN) 25 MG tablet Take 1 tablet (25 mg total) by mouth every 6 (six) hours as needed for nausea or vomiting. 30 tablet 6  . rizatriptan (MAXALT-MLT) 10 MG disintegrating tablet Take 1 tablet (10 mg total) by mouth as needed for migraine. May repeat in 2 hours if needed 9 tablet 11  . traZODone (DESYREL) 50 MG tablet Take 1 tablet (50 mg total) by mouth at bedtime. 30 tablet 0   No current facility-administered medications for this visit.      Psychiatric Specialty Exam: Review of Systems  Cardiovascular: Negative for chest pain.  Neurological: Negative for headaches.  Psychiatric/Behavioral: Negative for decreased concentration.    There were no vitals taken for this visit.There is no height or weight on file to calculate BMI.  General Appearance: Casual  Eye Contact:  Fair  Speech:  Slow  Volume:  Normal  Mood:fair  Affect:  Congruent  Thought Process:  Goal Directed  Orientation:  Full (Time, Place, and Person)  Thought Content:  Logical  Suicidal Thoughts:  No  Homicidal Thoughts:  No  Memory:  Immediate;   Fair Recent;   Fair  Judgement:  Fair  Insight:  Fair  Psychomotor Activity:  Normal  Concentration:  Concentration: Fair and Attention Span: variable  Recall:  Good  Fund of Knowledge:Good  Language: Good  Akathisia:  No  Handed:   AIMS (if indicated):  not done  Assets:  Desire for Improvement Housing  ADL's:  Intact  Cognition: WNL  Sleep:  Fair   Screenings: PHQ2-9   Flowsheet Row Office Visit from 12/03/2018 in [facility] Office Visit from 09/03/2017 in [facility]  PHQ-2 Total Score 5 0  PHQ-9 Total Score 13 --    Flowsheet Row Video Visit from [facility]15/2021 [facility] [place]  C-SSRS RISK CATEGORY No Risk      Assessment and Plan: as follows ADHD; stable, continue adderall with drug  holidays Work on increasing physical activity and having a regular structured day  Generalized anxiety disorder; fair on lexapro, will continue Insomnia; possible related to sleep apnea doing fair, continue trazdone, have talked about sleep study before  Fu 67m.   Miranda Ross, MD 4/20/20222:06 PM

## 2021-03-28 ENCOUNTER — Telehealth (HOSPITAL_COMMUNITY): Payer: Self-pay

## 2021-03-28 NOTE — Telephone Encounter (Signed)
Patient needs a refill on Adderall by the end of this week. Patient understands Dr. Gilmore Laroche is out of office today. [facility] [address] in [place]

## 2021-03-30 ENCOUNTER — Telehealth (HOSPITAL_COMMUNITY): Payer: Self-pay | Admitting: *Deleted

## 2021-03-30 MED ORDER — AMPHETAMINE-DEXTROAMPHETAMINE 10 MG PO TABS: 10.0000 mg | ORAL_TABLET | Freq: Every day | ORAL | 0 refills | Status: DC

## 2021-03-30 NOTE — Telephone Encounter (Signed)
Patient called and requested script for Adderall.  Her next appt iis 07/19/21.Marland Kitchen

## 2021-03-30 NOTE — Telephone Encounter (Signed)
Ok sent!

## 2021-04-13 ENCOUNTER — Other Ambulatory Visit: Payer: Self-pay

## 2021-04-13 ENCOUNTER — Encounter (INDEPENDENT_AMBULATORY_CARE_PROVIDER_SITE_OTHER): Payer: Self-pay | Admitting: Adult Health

## 2021-04-13 ENCOUNTER — Ambulatory Visit (INDEPENDENT_AMBULATORY_CARE_PROVIDER_SITE_OTHER): Payer: 59 | Admitting: Adult Health

## 2021-04-13 VITALS — BP 112/75 | HR 96 | Temp 98.2°F | Ht 69.0 in | Wt 255.0 lb

## 2021-04-13 DIAGNOSIS — E8881 Metabolic syndrome: Secondary | ICD-10-CM

## 2021-04-13 DIAGNOSIS — Z6837 Body mass index (BMI) 37.0-37.9, adult: Secondary | ICD-10-CM | POA: Diagnosis not present

## 2021-04-13 DIAGNOSIS — Z9189 Other specified personal risk factors, not elsewhere classified: Secondary | ICD-10-CM | POA: Diagnosis not present

## 2021-04-13 DIAGNOSIS — G4709 Other insomnia: Secondary | ICD-10-CM | POA: Diagnosis not present

## 2021-04-13 DIAGNOSIS — E6609 Other obesity due to excess calories: Secondary | ICD-10-CM

## 2021-04-13 DIAGNOSIS — E88819 Insulin resistance, unspecified: Secondary | ICD-10-CM

## 2021-04-13 MED ORDER — METFORMIN HCL 500 MG PO TABS: 500.0000 mg | ORAL_TABLET | Freq: Two times a day (BID) | ORAL | 0 refills | Status: DC

## 2021-04-13 MED ORDER — TRAZODONE HCL 50 MG PO TABS: 50.0000 mg | ORAL_TABLET | Freq: Every day | ORAL | 0 refills | Status: DC

## 2021-04-17 NOTE — Progress Notes (Signed)
Chief Complaint:   OBESITY Miranda Hunter is here to discuss her progress with her obesity treatment plan along with follow-up of her obesity related diagnoses. Miranda Hunter is on the Category 3 Plan and states she is following her eating plan approximately 50% of the time. Miranda Hunter states she is walking 60 minutes 7 times per week.  Today's visit was #: 38 Starting weight: 257 lbs Starting date: 12/03/2018 Today's weight: 255 lbs Today's date: 5/2192022 Total lbs lost to date: 2 Total lbs lost since last in-office visit: 0  Interim History: Miranda Hunter's sister was recently married- she has been traveling and attending events for months for these nuptials. She will be home for the next several weeks- ready to re-focus on healthy efforts. She has been using foods from "[employer]".  Subjective:   1. Insulin resistance 02/08/2021 BG 85- WNL, A1c elevated at 5.8.  Insulin level elevated at 28.2. Miranda Hunter is on Metformin 500 mg BID- taking with lunch and dinner.  2. Other insomnia PDMP verified- no aberrancies noted. Miranda Hunter reports good quality/quantity of sleep with trazodone 50 mg QHS.  3. At risk for diabetes mellitus Miranda Hunter is at higher than average risk for developing diabetes due to obesity, insulin resistance, and pre-diabetes.  Assessment/Plan:   1. Insulin resistance Miranda Hunter will continue to work on weight loss, exercise, and decreasing simple carbohydrates to help decrease the risk of diabetes. Miranda Hunter agreed to follow-up with [place] as directed to closely monitor her progress. -Check labs at next OV.  - metFORMIN (GLUCOPHAGE) 500 MG tablet; Take 1 tablet (500 mg total) by mouth 2 (two) times daily with a meal.  Dispense: 60 tablet; Refill: 0  2. Other insomnia The problem of recurrent insomnia was discussed. Orders and follow up as documented in patient record. Counseling: Intensive lifestyle modifications are the first line treatment for this issue. We discussed several  lifestyle modifications today and she will continue to work on diet, exercise and weight loss efforts.   Counseling  Limit or avoid alcohol, caffeinated beverages, and cigarettes, especially close to bedtime.   Do not eat a large meal or eat spicy foods right before bedtime. This can lead to digestive discomfort that can make it hard for you to sleep.  Keep a sleep diary to help you and your health care provider figure out what could be causing your insomnia.  . Make your bedroom a dark, comfortable place where it is easy to fall asleep. ? Put up shades or blackout curtains to block light from outside. ? Use a white noise machine to block noise. ? Keep the temperature cool. . Limit screen use before bedtime. This includes: ? Watching TV. ? Using your smartphone, tablet, or computer. . Stick to a routine that includes going to bed and waking up at the same times every day and night. This can help you fall asleep faster. Consider making a quiet activity, such as reading, part of your nighttime routine. . Try to avoid taking naps during the day so that you sleep better at night. . Get out of bed if you are still awake after 15 minutes of trying to sleep. Keep the lights down, but try reading or doing a quiet activity. When you feel sleepy, go back to bed.  - traZODone (DESYREL) 50 MG tablet; Take 1 tablet (50 mg total) by mouth at bedtime.  Dispense: 30 tablet; Refill: 0  3. At risk for diabetes mellitus Miranda Hunter was given approximately 15 minutes of diabetes education and counseling today.  We discussed intensive lifestyle modifications today with an emphasis on weight loss as well as increasing exercise and decreasing simple carbohydrates in her diet. We also reviewed medication options with an emphasis on risk versus benefit of those discussed.   Repetitive spaced learning was employed today to elicit superior memory formation and behavioral change.  4. Class 2 obesity due to excess  calories with body mass index (BMI) of 37.0 to 37.9 in adult, unspecified whether serious comorbidity present Miranda Hunter is currently in the action stage of change. As such, her goal is to continue with weight loss efforts. She has agreed to the Category 3 Plan and keeping a food journal and adhering to recommended goals of 350-500 calories and 35 grams protein with lunch.   Check fasting labs at next OV.  Exercise goals: As is  Behavioral modification strategies: increasing lean protein intake, decreasing simple carbohydrates, meal planning and cooking strategies and planning for success.  Miranda Hunter has agreed to follow-up with our clinic in 4 weeks- fasting. She was informed of the importance of frequent follow-up visits to maximize her success with intensive lifestyle modifications for her multiple health conditions.   Objective:   Blood pressure 112/75, pulse 96, temperature 98.2 F (36.8 C), height 5\' 9"  (1.753 m), weight 255 lb (115.7 kg), SpO2 97 %. Body mass index is 37.66 kg/m.  General: Cooperative, alert, well developed, in no acute distress. HEENT: Conjunctivae and lids unremarkable. Cardiovascular: Regular rhythm.  Lungs: Normal work of breathing. Neurologic: No focal deficits.   Lab Results  Component Value Date   CREATININE 0.56 (L) 02/08/2021   BUN 5 (L) 02/08/2021   NA 139 02/08/2021   K 4.5 02/08/2021   CL 103 02/08/2021   CO2 19 (L) 02/08/2021   Lab Results  Component Value Date   ALT 19 02/08/2021   AST 20 02/08/2021   ALKPHOS 108 02/08/2021   BILITOT 0.3 02/08/2021   Lab Results  Component Value Date   HGBA1C 5.8 (H) 02/08/2021   HGBA1C 5.6 10/12/2020   HGBA1C 5.7 (H) 05/04/2020   HGBA1C 5.4 10/26/2019   HGBA1C 5.5 06/08/2019   Lab Results  Component Value Date   INSULIN 38.2 (H) 02/08/2021   INSULIN 39.4 (H) 10/12/2020   INSULIN 31.0 (H) 05/04/2020   INSULIN 36.1 (H) 10/26/2019   INSULIN 33.7 (H) 06/08/2019   Lab Results  Component Value  Date   TSH 1.260 05/04/2020   Lab Results  Component Value Date   CHOL 170 02/08/2021   HDL 36 (L) 02/08/2021   LDLCALC 104 (H) 02/08/2021   TRIG 168 (H) 02/08/2021   CHOLHDL 4.7 (H) 02/08/2021   Lab Results  Component Value Date   WBC 9.8 12/03/2018   HGB 13.8 12/03/2018   HCT 42.9 12/03/2018   MCV 90 12/03/2018   PLT 445.0 (H) 09/03/2017   No results found for: IRON, TIBC, FERRITIN   Attestation Statements:   Reviewed by clinician on day of visit: allergies, medications, problem list, medical history, surgical history, family history, social history, and previous encounter notes.  11/03/2017, CMA, am acting as transcriptionist for Edmund Hilda, NP.  I have reviewed the above documentation for accuracy and completeness, and I agree with the above. -  Carmie Lanpher d. Gaylene Moylan, NP-C

## 2021-05-01 ENCOUNTER — Telehealth (HOSPITAL_COMMUNITY): Payer: Self-pay

## 2021-05-01 MED ORDER — AMPHETAMINE-DEXTROAMPHETAMINE 10 MG PO TABS: 10.0000 mg | ORAL_TABLET | Freq: Every day | ORAL | 0 refills | Status: DC

## 2021-05-01 NOTE — Telephone Encounter (Signed)
Patient needs a refill on Adderall sent to [facility] [address] in [place] °

## 2021-05-01 NOTE — Telephone Encounter (Signed)
sent 

## 2021-05-08 ENCOUNTER — Encounter (INDEPENDENT_AMBULATORY_CARE_PROVIDER_SITE_OTHER): Payer: Self-pay | Admitting: Adult Health

## 2021-05-09 ENCOUNTER — Other Ambulatory Visit (INDEPENDENT_AMBULATORY_CARE_PROVIDER_SITE_OTHER): Payer: Self-pay | Admitting: Adult Health

## 2021-05-09 DIAGNOSIS — G4709 Other insomnia: Secondary | ICD-10-CM

## 2021-05-09 DIAGNOSIS — E8881 Metabolic syndrome: Secondary | ICD-10-CM

## 2021-05-11 ENCOUNTER — Other Ambulatory Visit: Payer: Self-pay

## 2021-05-11 ENCOUNTER — Encounter (INDEPENDENT_AMBULATORY_CARE_PROVIDER_SITE_OTHER): Payer: Self-pay | Admitting: Adult Health

## 2021-05-11 ENCOUNTER — Telehealth (INDEPENDENT_AMBULATORY_CARE_PROVIDER_SITE_OTHER): Payer: 59 | Admitting: Adult Health

## 2021-05-11 DIAGNOSIS — G4709 Other insomnia: Secondary | ICD-10-CM | POA: Diagnosis not present

## 2021-05-11 DIAGNOSIS — E8881 Metabolic syndrome: Secondary | ICD-10-CM | POA: Diagnosis not present

## 2021-05-11 DIAGNOSIS — E6609 Other obesity due to excess calories: Secondary | ICD-10-CM | POA: Diagnosis not present

## 2021-05-11 DIAGNOSIS — Z6837 Body mass index (BMI) 37.0-37.9, adult: Secondary | ICD-10-CM

## 2021-05-11 MED ORDER — TRAZODONE HCL 50 MG PO TABS: 50.0000 mg | ORAL_TABLET | Freq: Every day | ORAL | 0 refills | Status: DC

## 2021-05-15 NOTE — Progress Notes (Signed)
TeleHealth Visit:  Due to the COVID-19 pandemic, this visit was completed with telemedicine (audio/video) technology to reduce patient and provider exposure as well as to preserve personal protective equipment.   Miranda Hunter has verbally consented to this TeleHealth visit. The patient is located at home, the provider is located at the [facility] office. The participants in this visit include the listed provider and patient. The visit was conducted today via video.   Chief Complaint: OBESITY [name] is here to discuss her progress with her obesity treatment plan along with follow-up of her obesity related diagnoses. [name] is on the Category 3 Plan and keeping a food journal and adhering to recommended goals of 350-500 calories and 35 g protein with lunch and states she is following her eating plan approximately 60% of the time. [name] states she is walking the dog 2 times daily 60 minutes 7 times per week.  Today's visit was #: 39 Starting weight: 257 lbs Starting date: 12/03/2018  Interim History: Wanell has been acutely ill since Memorial Day- productive cough (green-yellow mucus), pharyngitis, nasal drainage. She was started on doxycycline BID 7 days ago- 10 day course. She was exposed to COVID on 05/08/2021- home antigen test negative. Her grandfather was placed in [facility].  Subjective:   1. Other insomnia [name] reports being able to fall asleep and remain asleep with trazodone 50 mg QHS. She denies morning sedation after taking trazodone the evening prior.  2. Insulin resistance [name] is on Metformin 500 mg BID. She will experience diarrhea if she consumes higher carb/sugar food.  Lab Results  Component Value Date   INSULIN 38.2 (H) 02/08/2021   INSULIN 39.4 (H) 10/12/2020   INSULIN 31.0 (H) 05/04/2020   INSULIN 36.1 (H) 10/26/2019   INSULIN 33.7 (H) 06/08/2019   Lab Results  Component Value Date   HGBA1C 5.8 (H) 02/08/2021   Assessment/Plan:    1. Other insomnia The problem of recurrent insomnia was discussed. Orders and follow up as documented in patient record. Counseling: Intensive lifestyle modifications are the first line treatment for this issue. We discussed several lifestyle modifications today and she will continue to work on diet, exercise and weight loss efforts.   Counseling Limit or avoid alcohol, caffeinated beverages, and cigarettes, especially close to bedtime.  Do not eat a large meal or eat spicy foods right before bedtime. This can lead to digestive discomfort that can make it hard for you to sleep. Keep a sleep diary to help you and your health care provider figure out what could be causing your insomnia.  Make your bedroom a dark, comfortable place where it is easy to fall asleep. Put up shades or blackout curtains to block light from outside. Use a white noise machine to block noise. Keep the temperature cool. Limit screen use before bedtime. This includes: Watching TV. Using your smartphone, tablet, or computer. Stick to a routine that includes going to bed and waking up at the same times every day and night. This can help you fall asleep faster. Consider making a quiet activity, such as reading, part of your nighttime routine. Try to avoid taking naps during the day so that you sleep better at night. Get out of bed if you are still awake after 15 minutes of trying to sleep. Keep the lights down, but try reading or doing a quiet activity. When you feel sleepy, go back to bed.  - traZODone (DESYREL) 50 MG tablet; Take 1 tablet (50 mg total) by  mouth at bedtime.  Dispense: 30 tablet; Refill: 0  2. Insulin resistance [name] will continue to work on weight loss, exercise, and decreasing simple carbohydrates to help decrease the risk of diabetes. [name] agreed to follow-up with [place] as directed to closely monitor her progress.  -Check fasting labs at next OV. Continue Metformin as directed.  3. Class 2  obesity due to excess calories with body mass index (BMI) of 37.0 to 37.9 in adult, unspecified whether serious comorbidity present  [name] is currently in the action stage of change. As such, her goal is to continue with weight loss efforts. She has agreed to the Category 3 Plan.   MyChart message sent with grams of protein per meal. Check fasting labs at next OV.  Exercise goals:  As is  Behavioral modification strategies: increasing lean protein intake, decreasing simple carbohydrates, meal planning and cooking strategies, keeping healthy foods in the home, and planning for success.  [name] has agreed to follow-up with our clinic in 4 weeks. She was informed of the importance of frequent follow-up visits to maximize her success with intensive lifestyle modifications for her multiple health conditions.  Objective:   VITALS: Per patient if applicable, see vitals. GENERAL: Alert and in no acute distress. CARDIOPULMONARY: No increased WOB. Speaking in clear sentences.  PSYCH: Pleasant and cooperative. Speech normal rate and rhythm. Affect is appropriate. Insight and judgement are appropriate. Attention is focused, linear, and appropriate.  NEURO: Oriented as arrived to appointment on time with no prompting.   Lab Results  Component Value Date   CREATININE 0.56 (L) 02/08/2021   BUN 5 (L) 02/08/2021   NA 139 02/08/2021   K 4.5 02/08/2021   CL 103 02/08/2021   CO2 19 (L) 02/08/2021   Lab Results  Component Value Date   ALT 19 02/08/2021   AST 20 02/08/2021   ALKPHOS 108 02/08/2021   BILITOT 0.3 02/08/2021   Lab Results  Component Value Date   HGBA1C 5.8 (H) 02/08/2021   HGBA1C 5.6 10/12/2020   HGBA1C 5.7 (H) 05/04/2020   HGBA1C 5.4 10/26/2019   HGBA1C 5.5 06/08/2019   Lab Results  Component Value Date   INSULIN 38.2 (H) 02/08/2021   INSULIN 39.4 (H) 10/12/2020   INSULIN 31.0 (H) 05/04/2020   INSULIN 36.1 (H) 10/26/2019   INSULIN 33.7 (H) 06/08/2019   Lab Results   Component Value Date   TSH 1.260 05/04/2020   Lab Results  Component Value Date   CHOL 170 02/08/2021   HDL 36 (L) 02/08/2021   LDLCALC 104 (H) 02/08/2021   TRIG 168 (H) 02/08/2021   CHOLHDL 4.7 (H) 02/08/2021   Lab Results  Component Value Date   WBC 9.8 12/03/2018   HGB 13.8 12/03/2018   HCT 42.9 12/03/2018   MCV 90 12/03/2018   PLT 445.0 (H) 09/03/2017   No results found for: IRON, TIBC, FERRITIN  Attestation Statements:   Reviewed by clinician on day of visit: allergies, medications, problem list, medical history, surgical history, family history, social history, and previous encounter notes.  Edmund Hilda, CMA, am acting as transcriptionist for William Hamburger, NP.  I have reviewed the above documentation for accuracy and completeness, and I agree with the above. - Danen Lapaglia d. Dalon Reichart, NP-C

## 2021-05-24 ENCOUNTER — Telehealth (HOSPITAL_COMMUNITY): Payer: Self-pay

## 2021-05-24 MED ORDER — AMPHETAMINE-DEXTROAMPHETAMINE 10 MG PO TABS: 10.0000 mg | ORAL_TABLET | Freq: Every day | ORAL | 0 refills | Status: DC

## 2021-05-24 NOTE — Telephone Encounter (Signed)
Patient called requesting a refill on Adderall[facility] [address] [facility] [place]

## 2021-06-07 ENCOUNTER — Other Ambulatory Visit: Payer: Self-pay

## 2021-06-07 ENCOUNTER — Encounter (INDEPENDENT_AMBULATORY_CARE_PROVIDER_SITE_OTHER): Payer: Self-pay | Admitting: Adult Health

## 2021-06-07 ENCOUNTER — Ambulatory Visit (INDEPENDENT_AMBULATORY_CARE_PROVIDER_SITE_OTHER): Payer: 59 | Admitting: Adult Health

## 2021-06-07 VITALS — BP 103/71 | HR 110 | Temp 98.2°F | Ht 69.0 in | Wt 260.0 lb

## 2021-06-07 DIAGNOSIS — E88819 Insulin resistance, unspecified: Secondary | ICD-10-CM

## 2021-06-07 DIAGNOSIS — G4709 Other insomnia: Secondary | ICD-10-CM

## 2021-06-07 DIAGNOSIS — E8881 Metabolic syndrome: Secondary | ICD-10-CM | POA: Diagnosis not present

## 2021-06-07 DIAGNOSIS — E669 Obesity, unspecified: Secondary | ICD-10-CM

## 2021-06-07 DIAGNOSIS — Z9189 Other specified personal risk factors, not elsewhere classified: Secondary | ICD-10-CM

## 2021-06-07 DIAGNOSIS — G43109 Migraine with aura, not intractable, without status migrainosus: Secondary | ICD-10-CM

## 2021-06-07 MED ORDER — TRAZODONE HCL 50 MG PO TABS: 50.0000 mg | ORAL_TABLET | Freq: Every day | ORAL | 0 refills | Status: DC

## 2021-06-07 MED ORDER — TOPIRAMATE 25 MG PO TABS: 25.0000 mg | ORAL_TABLET | Freq: Every day | ORAL | 0 refills | Status: DC

## 2021-06-07 MED ORDER — METFORMIN HCL 500 MG PO TABS: 500.0000 mg | ORAL_TABLET | Freq: Two times a day (BID) | ORAL | 0 refills | Status: DC

## 2021-06-13 NOTE — Progress Notes (Signed)
Chief Complaint:   OBESITY Miranda Hunter is here to discuss her progress with her obesity treatment plan along with follow-up of her obesity related diagnoses. Miranda Hunter is on the Category 3 Plan and states she is following her eating plan approximately 75-80% of the time. Miranda Hunter states she is walking and resistance training 30 minutes 2 times per week.  Today's visit was #: 40 Starting weight: 257 lbs Starting date: 12/03/2018 Today's weight: 260 lbs Today's date: 06/07/2021 Total lbs lost to date: 0 Total lbs lost since last in-office visit: 0  Interim History: Miranda Hunter's grandfather is still in hospice- renal failure. Her mother, father, and sister staying with her during this difficult time. She recently traveled to [place] to celebrate her mother's birthday- frequent walking and celebration eating.  Subjective:   1. Insulin resistance 02/08/2021 BG 85, A1c 5.8 and insulin level 38.2. She is on Metformin 500 mg at lunch and dinner and tolerating it well, however, she can experience diarrhea if she consumes high CHO/fat foods.  2. Other insomnia Alaia reports ability to fall/remain asleep with nightly Trazodone 50 mg QD.   3. Migraine with aura and without status migrainosus, not intractable Miranda Hunter was previously on topiramate and tolerated it well. She denies history of nephrolithiasis. She reports increase in migraines with aura frequency over the last several weeks.   4. At risk for diarrhea Miranda Hunter is at higher risk of diarrhea due to Metformin for insulin resistance.  Assessment/Plan:   1. Insulin resistance Miranda Hunter will continue to work on weight loss, exercise, and decreasing simple carbohydrates to help decrease the risk of diabetes. Miranda Hunter agreed to follow-up with [place] as directed to closely monitor her progress. -Check fasting labs at next OV.  Refill- metFORMIN (GLUCOPHAGE) 500 MG tablet; Take 1 tablet (500 mg total) by mouth 2 (two) times daily with a meal.   Dispense: 60 tablet; Refill: 0  2. Other insomnia The problem of recurrent insomnia was discussed. Orders and follow up as documented in patient record. Counseling: Intensive lifestyle modifications are the first line treatment for this issue. We discussed several lifestyle modifications today and she will continue to work on diet, exercise and weight loss efforts.   Counseling Limit or avoid alcohol, caffeinated beverages, and cigarettes, especially close to bedtime.  Do not eat a large meal or eat spicy foods right before bedtime. This can lead to digestive discomfort that can make it hard for you to sleep. Keep a sleep diary to help you and your health care provider figure out what could be causing your insomnia.  Make your bedroom a dark, comfortable place where it is easy to fall asleep. Put up shades or blackout curtains to block light from outside. Use a white noise machine to block noise. Keep the temperature cool. Limit screen use before bedtime. This includes: Watching TV. Using your smartphone, tablet, or computer. Stick to a routine that includes going to bed and waking up at the same times every day and night. This can help you fall asleep faster. Consider making a quiet activity, such as reading, part of your nighttime routine. Try to avoid taking naps during the day so that you sleep better at night. Get out of bed if you are still awake after 15 minutes of trying to sleep. Keep the lights down, but try reading or doing a quiet activity. When you feel sleepy, go back to bed.  Refill- traZODone (DESYREL) 50 MG tablet; Take 1 tablet (50 mg total) by mouth at  bedtime.  Dispense: 30 tablet; Refill: 0  3. Migraine with aura and without status migrainosus, not intractable Follow up with neurology/Dr. Lucia Gaskins. Re-start topiramate 25 mg QHS.  Re-start- topiramate (TOPAMAX) 25 MG tablet; Take 1 tablet (25 mg total) by mouth at bedtime.  Dispense: 30 tablet; Refill: 0  4. At risk for  diarrhea Miranda Hunter was given approximately 15 minutes of diarrhea prevention counseling today. She is 35 y.o. female and has risk factors for diarrhea including medications and changes in diet. We discussed intensive lifestyle modifications today with an emphasis on specific weight loss instructions including dietary strategies.   Repetitive spaced learning was employed today to elicit superior memory formation and behavioral change.   5. Obesity with current BMI 38.5  Miranda Hunter is currently in the action stage of change. As such, her goal is to continue with weight loss efforts. She has agreed to the Category 3 Plan.   Check fasting labs at next OV.  Exercise goals:  As is  Behavioral modification strategies: increasing lean protein intake, decreasing simple carbohydrates, meal planning and cooking strategies, keeping healthy foods in the home, and planning for success.  Miranda Hunter has agreed to follow-up with our clinic in 4 weeks, fasting. She was informed of the importance of frequent follow-up visits to maximize her success with intensive lifestyle modifications for her multiple health conditions.   Objective:   Blood pressure 103/71, pulse (!) 110, temperature 98.2 F (36.8 C), height 5\' 9"  (1.753 m), weight 260 lb (117.9 kg), SpO2 98 %. Body mass index is 38.4 kg/m.  General: Cooperative, alert, well developed, in no acute distress. HEENT: Conjunctivae and lids unremarkable. Cardiovascular: Regular rhythm.  Lungs: Normal work of breathing. Neurologic: No focal deficits.   Lab Results  Component Value Date   CREATININE 0.56 (L) 02/08/2021   BUN 5 (L) 02/08/2021   NA 139 02/08/2021   K 4.5 02/08/2021   CL 103 02/08/2021   CO2 19 (L) 02/08/2021   Lab Results  Component Value Date   ALT 19 02/08/2021   AST 20 02/08/2021   ALKPHOS 108 02/08/2021   BILITOT 0.3 02/08/2021   Lab Results  Component Value Date   HGBA1C 5.8 (H) 02/08/2021   HGBA1C 5.6 10/12/2020   HGBA1C 5.7  (H) 05/04/2020   HGBA1C 5.4 10/26/2019   HGBA1C 5.5 06/08/2019   Lab Results  Component Value Date   INSULIN 38.2 (H) 02/08/2021   INSULIN 39.4 (H) 10/12/2020   INSULIN 31.0 (H) 05/04/2020   INSULIN 36.1 (H) 10/26/2019   INSULIN 33.7 (H) 06/08/2019   Lab Results  Component Value Date   TSH 1.260 05/04/2020   Lab Results  Component Value Date   CHOL 170 02/08/2021   HDL 36 (L) 02/08/2021   LDLCALC 104 (H) 02/08/2021   TRIG 168 (H) 02/08/2021   CHOLHDL 4.7 (H) 02/08/2021   Lab Results  Component Value Date   VD25OH 48.0 02/08/2021   VD25OH 52.2 10/12/2020   VD25OH 46.1 05/04/2020   Lab Results  Component Value Date   WBC 9.8 12/03/2018   HGB 13.8 12/03/2018   HCT 42.9 12/03/2018   MCV 90 12/03/2018   PLT 445.0 (H) 09/03/2017   No results found for: IRON, TIBC, FERRITIN  Attestation Statements:   Reviewed by clinician on day of visit: allergies, medications, problem list, medical history, surgical history, family history, social history, and previous encounter notes.  11/03/2017, CMA, am acting as transcriptionist for Edmund Hilda, NP.  I have reviewed the  above documentation for accuracy and completeness, and I agree with the above. -  Yacoub Diltz d. Garnie Borchardt, NP-C

## 2021-06-16 ENCOUNTER — Other Ambulatory Visit (INDEPENDENT_AMBULATORY_CARE_PROVIDER_SITE_OTHER): Payer: Self-pay | Admitting: Adult Health

## 2021-06-16 DIAGNOSIS — F3289 Other specified depressive episodes: Secondary | ICD-10-CM

## 2021-06-19 NOTE — Telephone Encounter (Signed)
LAST APPOINTMENT DATE: 06/07/2021 NEXT APPOINTMENT DATE: 07/04/2021   [id number] [place], [place] - [address] [place] [address] Phone: [phone] Fax: [phone]  Patient is requesting a refill of the following medications: Requested Prescriptions   Pending Prescriptions Disp Refills   escitalopram (LEXAPRO) 20 MG tablet [Pharmacy Med Name: ESCITALOPRAM 20 MG TABLET] 30 tablet     Sig: TAKE 1 TABLET BY MOUTH EVERY DAY    Date last filled: 01/04/21 #90 Previously [facility] by [facility]  Lab Results  Component Value Date   HGBA1C 5.8 (H) 02/08/2021   HGBA1C 5.6 10/12/2020   HGBA1C 5.7 (H) 05/04/2020   Lab Results  Component Value Date   LDLCALC 104 (H) 02/08/2021   CREATININE 0.56 (L) 02/08/2021   Lab Results  Component Value Date   VD25OH 48.0 02/08/2021   VD25OH 52.2 10/12/2020   VD25OH 46.1 05/04/2020    BP Readings from Last 3 Encounters:  06/07/21 103/71  04/13/21 112/75  02/08/21 112/78

## 2021-06-22 ENCOUNTER — Other Ambulatory Visit: Payer: Self-pay | Admitting: Psychiatry

## 2021-06-22 ENCOUNTER — Telehealth (HOSPITAL_COMMUNITY): Payer: Self-pay

## 2021-06-22 MED ORDER — AMPHETAMINE-DEXTROAMPHETAMINE 10 MG PO TABS: 10.0000 mg | ORAL_TABLET | Freq: Every day | ORAL | 0 refills | Status: DC

## 2021-06-22 NOTE — Telephone Encounter (Signed)
Ordered for a month. Will defer additional refills to Dr. Lynnae Sandhoff.   I have utilized the [place] Controlled Substances Reporting System (PMP AWARxE) to confirm adherence regarding the patient's medication. My review reveals appropriate prescription fills.

## 2021-06-22 NOTE — Telephone Encounter (Signed)
Medication refill request - Call from pt requesting a refill of her prescribed Adderall [facility] mg [facility] [address] in [place].  Last provided 05/24/21 and patient returns next on 07/19/21.

## 2021-06-28 ENCOUNTER — Encounter (INDEPENDENT_AMBULATORY_CARE_PROVIDER_SITE_OTHER): Payer: Self-pay | Admitting: Adult Health

## 2021-06-28 NOTE — Telephone Encounter (Signed)
Pt last seen by Katy Danford, FNP.  

## 2021-06-29 NOTE — Telephone Encounter (Signed)
Can you please reschedule this pt please.

## 2021-07-04 ENCOUNTER — Ambulatory Visit (INDEPENDENT_AMBULATORY_CARE_PROVIDER_SITE_OTHER): Payer: 59 | Admitting: Adult Health

## 2021-07-06 ENCOUNTER — Other Ambulatory Visit (INDEPENDENT_AMBULATORY_CARE_PROVIDER_SITE_OTHER): Payer: Self-pay | Admitting: Adult Health

## 2021-07-06 DIAGNOSIS — G43109 Migraine with aura, not intractable, without status migrainosus: Secondary | ICD-10-CM

## 2021-07-06 DIAGNOSIS — E8881 Metabolic syndrome: Secondary | ICD-10-CM

## 2021-07-06 NOTE — Telephone Encounter (Signed)
LAST APPOINTMENT DATE:  cancelled 07/04/21 Cancelled 07/19/21 Last OV-06/07/21 NEXT APPOINTMENT DATE: 07/24/21   [id number] [place], [place] - [address] [place] [address] Phone: [phone] Fax: [phone]  Patient is requesting a refill of the following medications: Pending Prescriptions:                       Disp   Refills   topiramate (TOPAMAX) 25 MG tablet [Pharmac*30 tab*0       Sig: TAKE 1 TABLET BY MOUTH EVERYDAY AT BEDTIME   Date last filled: 06/07/21 Previously prescribed by [facility]  Lab Results      Component                Value               Date                      HGBA1C                   5.8 (H)             02/08/2021                HGBA1C                   5.6                 10/12/2020                HGBA1C                   5.7 (H)             05/04/2020           Lab Results      Component                Value               Date                      LDLCALC                  104 (H)             02/08/2021                CREATININE               0.56 (L)            02/08/2021           Lab Results      Component                Value               Date                      VD25OH                   48.0                02/08/2021                VD25OH                   52.2  10/12/2020                VD25OH                   46.1                05/04/2020            BP Readings from Last 3 Encounters: 06/07/21 : 103/71 04/13/21 : 112/75 02/08/21 : 112/78

## 2021-07-06 NOTE — Telephone Encounter (Signed)
LAST APPOINTMENT DATE: 06/07/21 NEXT APPOINTMENT DATE: 07/24/21   [id number] [place], [place] - [address] [place] [address] Phone: [phone] Fax: [phone]  Patient is requesting a refill of the following medications: Pending Prescriptions:                       Disp   Refills   metFORMIN (GLUCOPHAGE) 500 MG tablet [Phar*60 tab*0       Sig: TAKE 1 TABLET BY MOUTH 2 TIMES DAILY WITH A MEAL.   Date last filled: 06/07/21 Previously prescribed by [facility]  Lab Results      Component                Value               Date                      HGBA1C                   5.8 (H)             02/08/2021                HGBA1C                   5.6                 10/12/2020                HGBA1C                   5.7 (H)             05/04/2020           Lab Results      Component                Value               Date                      LDLCALC                  104 (H)             02/08/2021                CREATININE               0.56 (L)            02/08/2021           Lab Results      Component                Value               Date                      VD25OH                   48.0                02/08/2021                VD25OH                   52.2  10/12/2020                VD25OH                   46.1                05/04/2020            BP Readings from Last 3 Encounters: 06/07/21 : 103/71 04/13/21 : 112/75 02/08/21 : 112/78

## 2021-07-11 ENCOUNTER — Encounter (INDEPENDENT_AMBULATORY_CARE_PROVIDER_SITE_OTHER): Payer: Self-pay | Admitting: Adult Health

## 2021-07-11 DIAGNOSIS — G4709 Other insomnia: Secondary | ICD-10-CM

## 2021-07-11 IMAGING — DX DG HIP (WITH OR WITHOUT PELVIS) 2-3V*R*
3 series · 3 of 3 positions shown · non-contrast
Comparison: None.

CLINICAL DATA: Recent fall 3 days ago with right hip pain, initial
encounter

EXAM:
DG HIP (WITH OR WITHOUT PELVIS) 3V RIGHT

[pelvis ap]
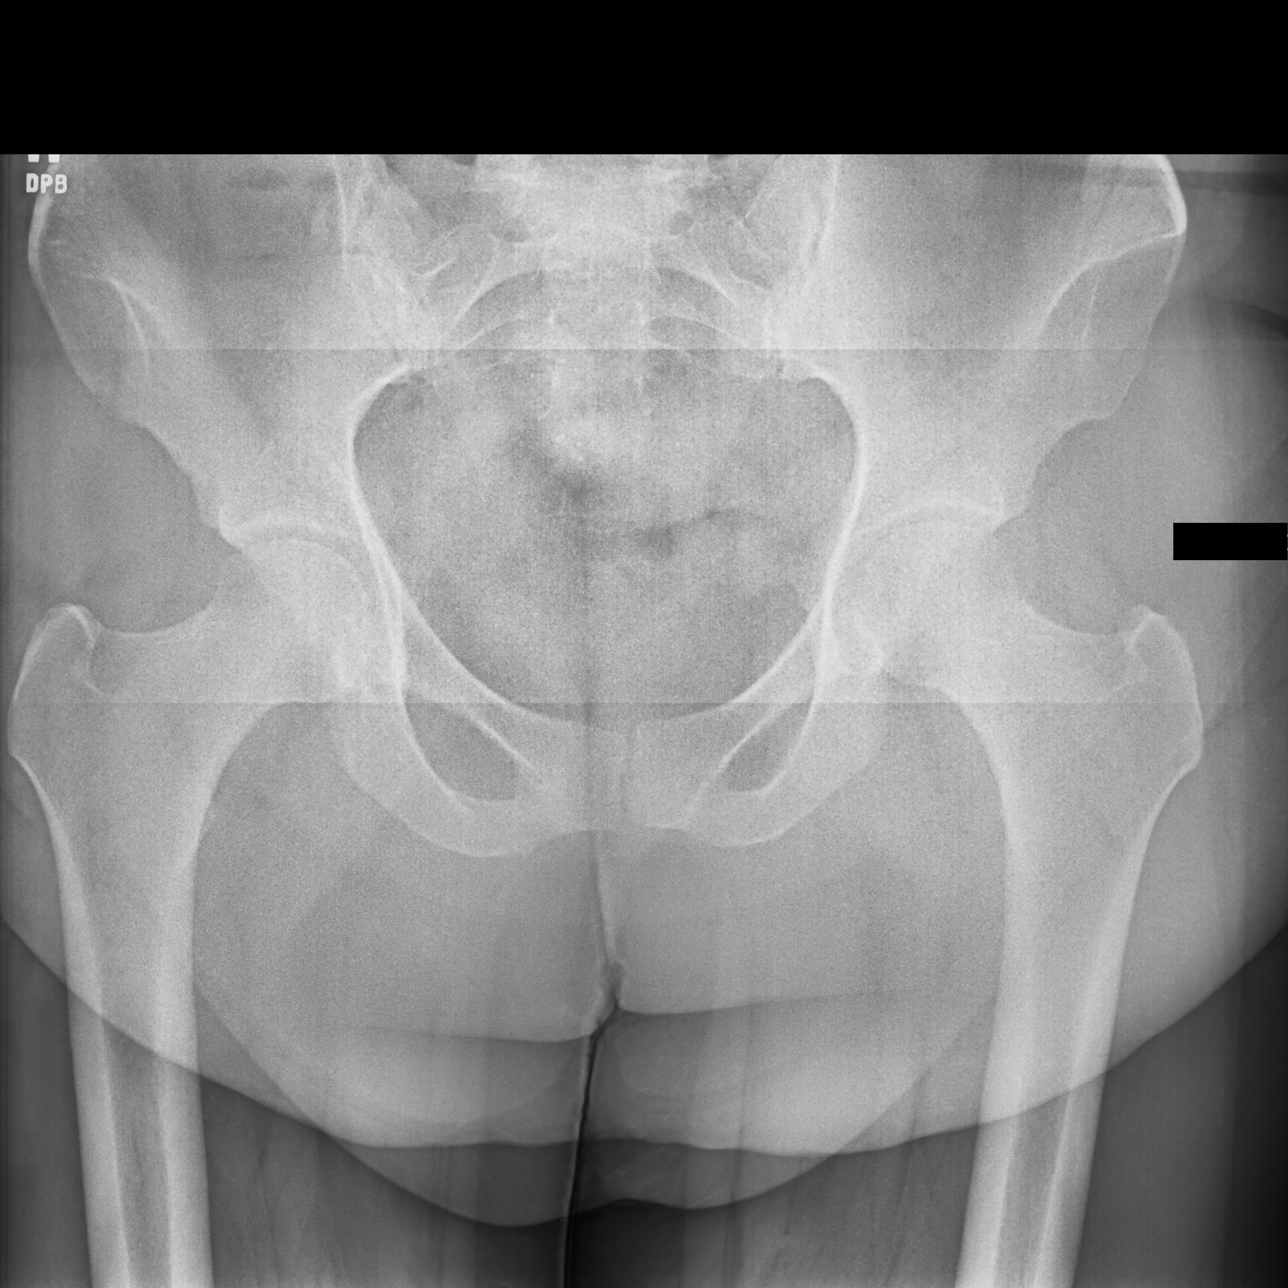

[hip joint ap]
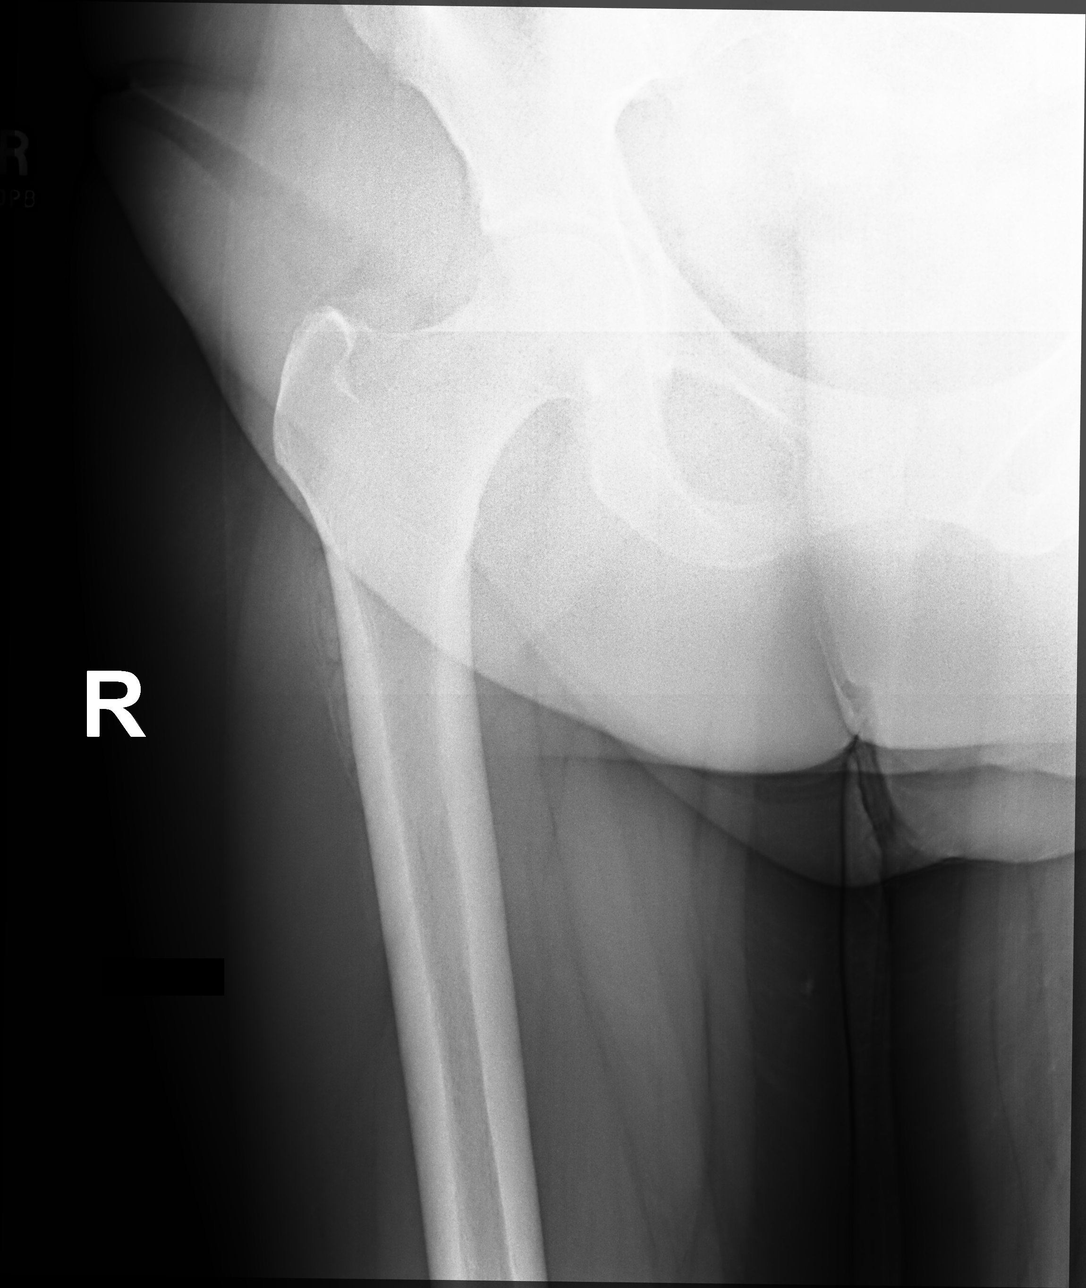

[hip joint (frog view)]
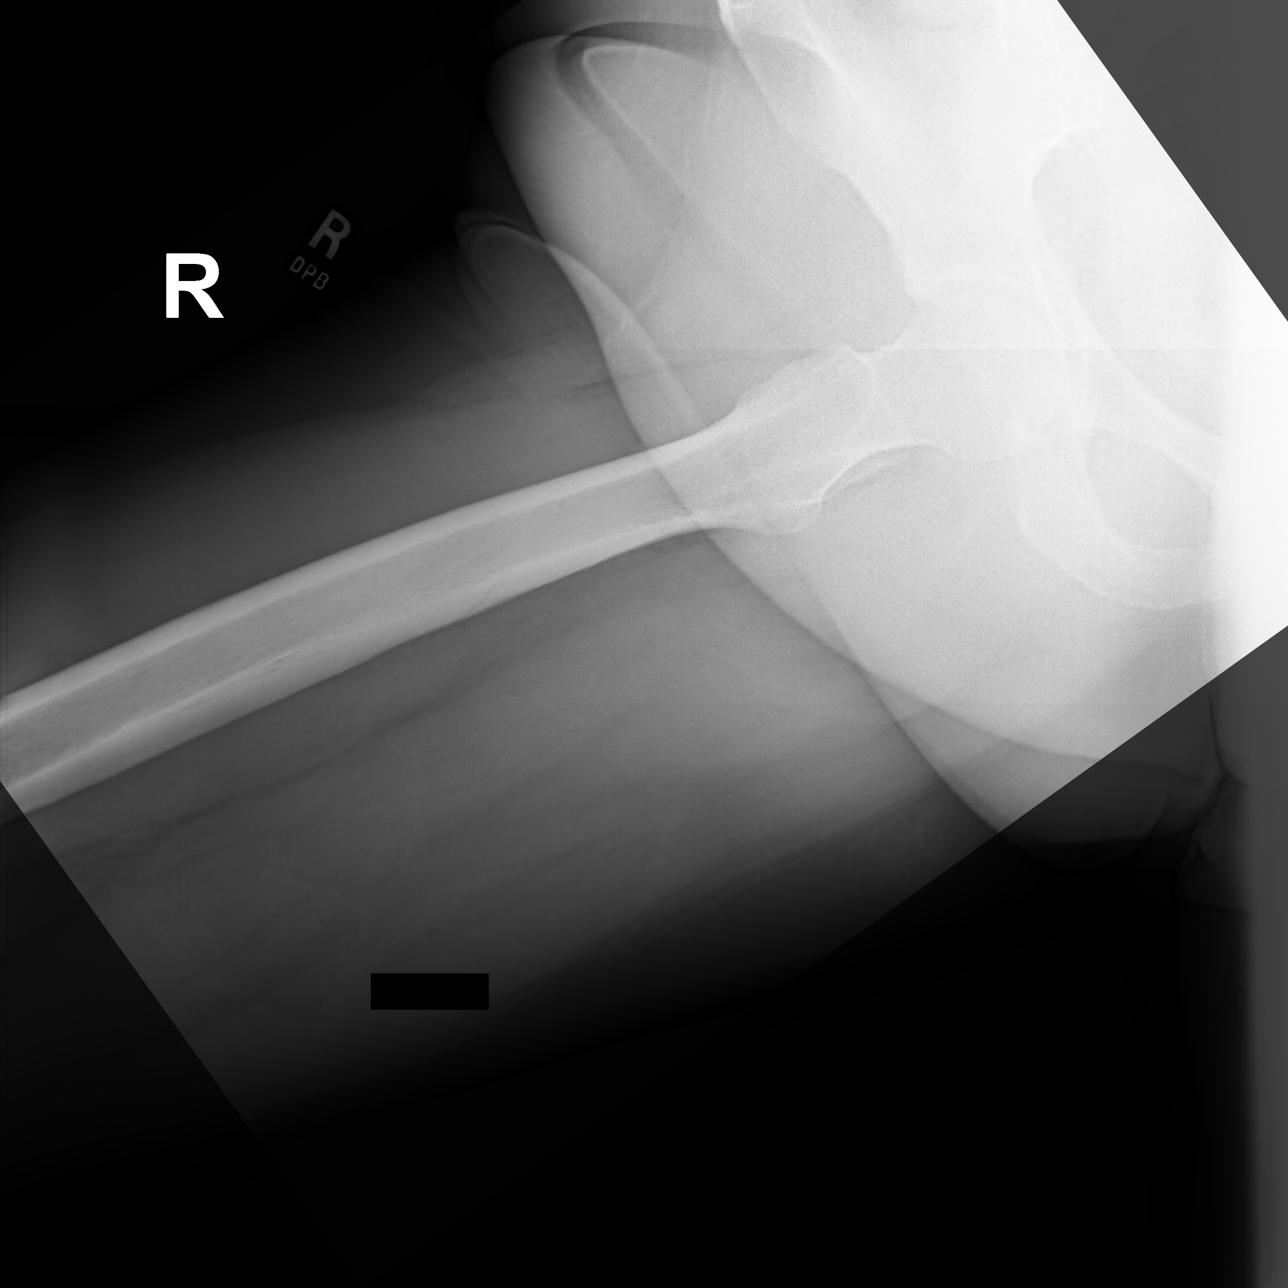

[3 of 3 positions shown; findings below may reference images not displayed]

FINDINGS: Pelvic ring is intact. No acute fracture or dislocation is noted. No
soft tissue abnormality is seen.
IMPRESSION: No acute abnormality noted.

## 2021-07-11 NOTE — Telephone Encounter (Signed)
Last OV with Katy 

## 2021-07-12 MED ORDER — TRAZODONE HCL 50 MG PO TABS: 50.0000 mg | ORAL_TABLET | Freq: Every day | ORAL | 0 refills | Status: DC

## 2021-07-12 NOTE — Telephone Encounter (Signed)
LAST APPOINTMENT DATE: 06/07/21 NEXT APPOINTMENT DATE: 07/24/21   [id number] [place], [place] - [address] [place] [address] Phone: [phone] Fax: [phone]  Patient is requesting a refill of the following medications: No prescriptions requested or ordered in this encounter   Date last filled: 06/07/21 Previously prescribed by [facility]  Lab Results      Component                Value               Date                      HGBA1C                   5.8 (H)             02/08/2021                HGBA1C                   5.6                 10/12/2020                HGBA1C                   5.7 (H)             05/04/2020           Lab Results      Component                Value               Date                      LDLCALC                  104 (H)             02/08/2021                CREATININE               0.56 (L)            02/08/2021           Lab Results      Component                Value               Date                      VD25OH                   48.0                02/08/2021                VD25OH                   52.2                10/12/2020                VD25OH                   46.1  05/04/2020            BP Readings from Last 3 Encounters: 06/07/21 : 103/71 04/13/21 : 112/75 02/08/21 : 112/78

## 2021-07-14 ENCOUNTER — Other Ambulatory Visit (INDEPENDENT_AMBULATORY_CARE_PROVIDER_SITE_OTHER): Payer: Self-pay | Admitting: Adult Health

## 2021-07-14 DIAGNOSIS — E8881 Metabolic syndrome: Secondary | ICD-10-CM

## 2021-07-14 DIAGNOSIS — G43109 Migraine with aura, not intractable, without status migrainosus: Secondary | ICD-10-CM

## 2021-07-17 ENCOUNTER — Other Ambulatory Visit (INDEPENDENT_AMBULATORY_CARE_PROVIDER_SITE_OTHER): Payer: Self-pay | Admitting: Adult Health

## 2021-07-17 DIAGNOSIS — F3289 Other specified depressive episodes: Secondary | ICD-10-CM

## 2021-07-17 NOTE — Telephone Encounter (Signed)
LAST APPOINTMENT DATE: 06/07/21 NEXT APPOINTMENT DATE: 07/24/21   [id number] [place], [place] - [address] [place] [address] Phone: [phone] Fax: [phone]  Patient is requesting a refill of the following medications: Pending Prescriptions:                       Disp   Refills   escitalopram (LEXAPRO) 20 MG tablet [Pharm*30 tab*0       Sig: TAKE 1 TABLET BY MOUTH EVERY DAY   Date last filled: 06/19/21 Previously prescribed by [facility]  Lab Results      Component                Value               Date                      HGBA1C                   5.8 (H)             02/08/2021                HGBA1C                   5.6                 10/12/2020                HGBA1C                   5.7 (H)             05/04/2020           Lab Results      Component                Value               Date                      LDLCALC                  104 (H)             02/08/2021                CREATININE               0.56 (L)            02/08/2021           Lab Results      Component                Value               Date                      VD25OH                   48.0                02/08/2021                VD25OH                   52.2                10/12/2020  VD25OH                   46.1                05/04/2020            BP Readings from Last 3 Encounters: 06/07/21 : 103/71 04/13/21 : 112/75 02/08/21 : 112/78

## 2021-07-19 ENCOUNTER — Ambulatory Visit (INDEPENDENT_AMBULATORY_CARE_PROVIDER_SITE_OTHER): Payer: 59 | Admitting: Adult Health

## 2021-07-19 ENCOUNTER — Telehealth (HOSPITAL_COMMUNITY): Payer: 59 | Admitting: Psychiatry

## 2021-07-19 ENCOUNTER — Other Ambulatory Visit: Payer: Self-pay | Admitting: Psychiatry

## 2021-07-19 ENCOUNTER — Telehealth (HOSPITAL_COMMUNITY): Payer: Self-pay

## 2021-07-19 NOTE — Telephone Encounter (Signed)
Decline at this time. She has filled 60 tabs since 7/3. She should have enough refill until her next appointment.

## 2021-07-19 NOTE — Telephone Encounter (Signed)
Patient called to request a refill on Adderall. [facility] [address] [facility] [place]

## 2021-07-20 ENCOUNTER — Other Ambulatory Visit: Payer: Self-pay | Admitting: Psychiatry

## 2021-07-20 MED ORDER — AMPHETAMINE-DEXTROAMPHETAMINE 10 MG PO TABS: 10.0000 mg | ORAL_TABLET | Freq: Every day | ORAL | 0 refills | Status: DC

## 2021-07-20 NOTE — Telephone Encounter (Signed)
Lvm informing pt that on call provider denied refill request. Nothing further at this time

## 2021-07-20 NOTE — Telephone Encounter (Signed)
I called the pharmacy and she picked # 30 tablet on July 30th.  She never received 60 tablets. She is due to pick up her medication in few days.  For your information

## 2021-07-20 NOTE — Telephone Encounter (Signed)
Patient called back asking why was refill denied. Patient states she would have had a refill but was asked to reschedule her appointment since Dr. Gilmore Laroche is out of the office this week. Patient states she only has two pills left and will be out by next appt, which is next Wednesday 08/31. I asked Shawn about this and was informed to send this message. Thanks

## 2021-07-20 NOTE — Addendum Note (Signed)
Addended by: Kathryne Sharper T on: 07/20/2021 12:20 PM   Modules accepted: Orders

## 2021-07-20 NOTE — Telephone Encounter (Signed)
Called pharmacy. Patient has picked up this medication on 07/01 and 7/30. I called patient and told her these dates and that medication is being denied a refill because she should still have enough until her appt. Patient stated her understanding. Nothing further is needed at this time. Thanks

## 2021-07-20 NOTE — Telephone Encounter (Signed)
Could you verify with the pharmacy. If the following info is correct. According to the databse, she filled 60 tabs since 7/3. She should have enough refill until her next appointment.

## 2021-07-20 NOTE — Telephone Encounter (Signed)
Hi Reina,  I reviewed the chart.  Patient was given last prescription on July 28 #30 and she is due for a refill.  I called her prescription and patient can pick up meds on her due date.

## 2021-07-24 ENCOUNTER — Encounter (INDEPENDENT_AMBULATORY_CARE_PROVIDER_SITE_OTHER): Payer: Self-pay | Admitting: Adult Health

## 2021-07-24 ENCOUNTER — Other Ambulatory Visit: Payer: Self-pay

## 2021-07-24 ENCOUNTER — Ambulatory Visit (INDEPENDENT_AMBULATORY_CARE_PROVIDER_SITE_OTHER): Payer: 59 | Admitting: Adult Health

## 2021-07-24 VITALS — BP 122/88 | HR 98 | Temp 97.7°F | Ht 69.0 in | Wt 260.0 lb

## 2021-07-24 DIAGNOSIS — E669 Obesity, unspecified: Secondary | ICD-10-CM | POA: Diagnosis not present

## 2021-07-24 DIAGNOSIS — G4709 Other insomnia: Secondary | ICD-10-CM

## 2021-07-24 DIAGNOSIS — R7303 Prediabetes: Secondary | ICD-10-CM | POA: Diagnosis not present

## 2021-07-24 NOTE — Progress Notes (Signed)
Chief Complaint:   OBESITY Miranda Hunter is here to discuss her progress with her obesity treatment plan along with follow-up of her obesity related diagnoses. Miranda Hunter is on the Category 3 Plan and states she is following her eating plan approximately 80% of the time. Miranda Hunter states she is walking for 60 minutes 7 times per week.  Today's visit was #: 41 Starting weight: 257 lbs Starting date: 12/03/2018 Today's weight: 260 lbs Today's date: 07/24/2021 Total lbs lost to date: 0 Total lbs lost since last in-office visit: 0  Interim History: Miranda Hunter says that her paternal grandfather passed away on 07/15/2021.  He was 92.  The memorial will be held the first week of October - would have been her grandparent's 79th wedding anniversary.  The plan is to spread her grandparent's ashes in the ocean.  Subjective:   1. Other insomnia PDMP reviewed - no aberrancies noted. She is on trazodone 50 mg at bedtime. Miranda Hunter is on Adderall 10 mg daily for ADHD. She denies Adderall worsening pre-existing insomnia.  2. Prediabetes Miranda Hunter's mother had thyroid cancer in 2008, which was treated with surgical excision of her thyroid. Now her mother is medically treat with levothyroxine. She is on metformin 500 mg twice daily for elevated A1c.  Lab Results  Component Value Date   HGBA1C 5.8 (H) 02/08/2021   Lab Results  Component Value Date   INSULIN 38.2 (H) 02/08/2021   INSULIN 39.4 (H) 10/12/2020   INSULIN 31.0 (H) 05/04/2020   INSULIN 36.1 (H) 10/26/2019   INSULIN 33.7 (H) 06/08/2019   Assessment/Plan:   1. Other insomnia The problem of recurrent insomnia was discussed. Orders and follow up as documented in patient record. Counseling: Intensive lifestyle modifications are the first line treatment for this issue. We discussed several lifestyle modifications today and she will continue to work on diet, exercise and weight loss efforts. Practice good sleep hygiene and continue  trazodone.  Counseling Limit or avoid alcohol, caffeinated beverages, and cigarettes, especially close to bedtime.  Do not eat a large meal or eat spicy foods right before bedtime. This can lead to digestive discomfort that can make it hard for you to sleep. Keep a sleep diary to help you and your health care provider figure out what could be causing your insomnia.  Make your bedroom a dark, comfortable place where it is easy to fall asleep. Put up shades or blackout curtains to block light from outside. Use a white noise machine to block noise. Keep the temperature cool. Limit screen use before bedtime. This includes: Watching TV. Using your smartphone, tablet, or computer. Stick to a routine that includes going to bed and waking up at the same times every day and night. This can help you fall asleep faster. Consider making a quiet activity, such as reading, part of your nighttime routine. Try to avoid taking naps during the day so that you sleep better at night. Get out of bed if you are still awake after 15 minutes of trying to sleep. Keep the lights down, but try reading or doing a quiet activity. When you feel sleepy, go back to bed.  2. Prediabetes Miranda Hunter will continue to work on weight loss, exercise, and decreasing simple carbohydrates to help decrease the risk of diabetes. Continue metformin 500 mg twice daily.  No need for refill.  3. Obesity with current BMI 38.5  Miranda Hunter is currently in the action stage of change. As such, her goal is to continue with weight loss efforts.  She has agreed to the Category 3 Plan.   Exercise goals:  As is.  Behavioral modification strategies: increasing lean protein intake, decreasing simple carbohydrates, keeping healthy foods in the home, ways to avoid boredom eating, and planning for success.  Check fasting labs at next office visit.  Use the time at lunch for herself to relax/reset. Listen to a book. Go outside with the dog. Yoga  practice via YouTube. Stand Up Paddle at lunch- when working remotely from [employer].  Miranda Hunter has agreed to follow-up with our clinic in 4 weeks, fasting. She was informed of the importance of frequent follow-up visits to maximize her success with intensive lifestyle modifications for her multiple health conditions.   Objective:   Blood pressure 122/88, pulse 98, temperature 97.7 F (36.5 C), height 5\' 9"  (1.753 m), weight 260 lb (117.9 kg), SpO2 97 %. Body mass index is 38.4 kg/m.  General: Cooperative, alert, well developed, in no acute distress. HEENT: Conjunctivae and lids unremarkable. Cardiovascular: Regular rhythm.  Lungs: Normal work of breathing. Neurologic: No focal deficits.   Lab Results  Component Value Date   CREATININE 0.56 (L) 02/08/2021   BUN 5 (L) 02/08/2021   NA 139 02/08/2021   K 4.5 02/08/2021   CL 103 02/08/2021   CO2 19 (L) 02/08/2021   Lab Results  Component Value Date   ALT 19 02/08/2021   AST 20 02/08/2021   ALKPHOS 108 02/08/2021   BILITOT 0.3 02/08/2021   Lab Results  Component Value Date   HGBA1C 5.8 (H) 02/08/2021   HGBA1C 5.6 10/12/2020   HGBA1C 5.7 (H) 05/04/2020   HGBA1C 5.4 10/26/2019   HGBA1C 5.5 06/08/2019   Lab Results  Component Value Date   INSULIN 38.2 (H) 02/08/2021   INSULIN 39.4 (H) 10/12/2020   INSULIN 31.0 (H) 05/04/2020   INSULIN 36.1 (H) 10/26/2019   INSULIN 33.7 (H) 06/08/2019   Lab Results  Component Value Date   TSH 1.260 05/04/2020   Lab Results  Component Value Date   CHOL 170 02/08/2021   HDL 36 (L) 02/08/2021   LDLCALC 104 (H) 02/08/2021   TRIG 168 (H) 02/08/2021   CHOLHDL 4.7 (H) 02/08/2021   Lab Results  Component Value Date   VD25OH 48.0 02/08/2021   VD25OH 52.2 10/12/2020   VD25OH 46.1 05/04/2020   Lab Results  Component Value Date   WBC 9.8 12/03/2018   HGB 13.8 12/03/2018   HCT 42.9 12/03/2018   MCV 90 12/03/2018   PLT 445.0 (H) 09/03/2017   Attestation Statements:   Reviewed by  clinician on day of visit: allergies, medications, problem list, medical history, surgical history, family history, social history, and previous encounter notes.  Time spent on visit including pre-visit chart review and post-visit care and charting was 28 minutes.   I, 11/03/2017, CMA, am acting as Insurance claims handler for Energy manager, NP.  I have reviewed the above documentation for accuracy and completeness, and I agree with the above. - Caitlan Chauca d. Conleigh Heinlein, NP-C

## 2021-07-26 ENCOUNTER — Encounter (HOSPITAL_COMMUNITY): Payer: Self-pay | Admitting: Psychiatry

## 2021-07-26 ENCOUNTER — Telehealth (INDEPENDENT_AMBULATORY_CARE_PROVIDER_SITE_OTHER): Payer: 59 | Admitting: Psychiatry

## 2021-07-26 DIAGNOSIS — F411 Generalized anxiety disorder: Secondary | ICD-10-CM

## 2021-07-26 DIAGNOSIS — F900 Attention-deficit hyperactivity disorder, predominantly inattentive type: Secondary | ICD-10-CM | POA: Diagnosis not present

## 2021-07-26 NOTE — Progress Notes (Signed)
[facility] Follow up visit  Patient Identification: Miranda Hunter MRN:  [id number] Date of Evaluation:  07/26/2021  Chief complaint: follow up adhd Referral Source: primary care Visit Diagnosis:    ICD-10-CM   1. GAD (generalized anxiety disorder)  F41.1     2. Attention deficit hyperactivity disorder (ADHD), predominantly inattentive type  F90.0      Virtual Visit via Video Note  I connected with Miranda Hunter on 07/26/21 at 10:15 AM EDT by a video enabled telemedicine application and verified that I am speaking with the correct person using two identifiers.  Location: Patient: home Provider: home office   I discussed the limitations of evaluation and management by telemedicine and the availability of in person appointments. The patient expressed understanding and agreed to proceed.     I discussed the assessment and treatment plan with the patient. The patient was provided an opportunity to ask questions and all were answered. The patient agreed with the plan and demonstrated an understanding of the instructions.   The patient was advised to call back or seek an in-person evaluation if the symptoms worsen or if the condition fails to improve as anticipated.  I provided 10 minutes of non-face-to-face time during this encounter.     History of Present Illness: 35  years old single White female referred by PCP for management of inattention. Patient has been a part of weight maintenance program   Grandfather died was in nursing home, handling it fair Visiting parents at their beach house and working from home Adderall helps with inattention, no side effects  Stress level manageable, takes drug holidays  Aggravating factors; history of ADHD.  Grandparents death Modifying factors; dog her job, family   Duration ADHD since young age  Severity improved       Past Psychiatric History: anxiety, inattention  Previous Psychotropic Medications: Yes   Substance Abuse  History in the last 12 months:  No.  Consequences of Substance Abuse: NA  Past Medical History:  Past Medical History:  Diagnosis Date   Anxiety    Asthma    History of chicken pox    IBS (irritable bowel syndrome)    Migraines     Past Surgical History:  Procedure Laterality Date   tonsillectomy and adnoidectomy Bilateral 2007    Family Psychiatric History: denies  Family History:  Family History  Problem Relation Age of Onset   Migraines Mother    Prostate cancer Father    Hypertension Father    Asthma Sister    Lung cancer Maternal Grandmother        heavy smoker   Migraines Maternal Grandmother    Lung cancer Maternal Grandfather        heavy smoker   Asthma Sister     Social History:   Social History   Socioeconomic History   Marital status: Single    Spouse name: Not on file   Number of children: 0   Years of education: Not on file   Highest education level: Bachelor's degree (e.g., BA, AB, BS)  Occupational History   Not on file  Tobacco Use   Smoking status: Never   Smokeless tobacco: Never  Vaping Use   Vaping Use: Never used  Substance and Sexual Activity   Alcohol use: Yes    Comment: socially   Drug use: No   Sexual activity: Not Currently    Comment: kyleena  Other Topics Concern   Not on file  Social History Narrative   She is  a recruiter   "Miranda Hunter"   Recently got a new puppy- a golden retriever named [name]   Caffeine once a week      Social Determinants of Corporate investment banker Strain: Not on file  Food Insecurity: Not on file  Transportation Needs: Not on file  Physical Activity: Not on file  Stress: Not on file  Social Connections: Not on file     Allergies:   Allergies  Allergen Reactions   Amoxicillin-Pot Clavulanate Diarrhea    Metabolic Disorder Labs: Lab Results  Component Value Date   HGBA1C 5.8 (H) 02/08/2021   No results found for: PROLACTIN Lab Results  Component Value Date   CHOL 170 02/08/2021    TRIG 168 (H) 02/08/2021   HDL 36 (L) 02/08/2021   CHOLHDL 4.7 (H) 02/08/2021   LDLCALC 104 (H) 02/08/2021   LDLCALC 99 10/12/2020   Lab Results  Component Value Date   TSH 1.260 05/04/2020    Therapeutic Level Labs: No results found for: LITHIUM No results found for: CBMZ No results found for: VALPROATE  Current Medications: Current Outpatient Medications  Medication Sig Dispense Refill   albuterol (VENTOLIN HFA) 108 (90 Base) MCG/ACT inhaler Inhale 2 puffs into the lungs every 6 (six) hours as needed for wheezing or shortness of breath. 18 g 2   amphetamine-dextroamphetamine (ADDERALL) 10 MG tablet Take 1 tablet (10 mg total) by mouth daily. Thirty days bridge supply given by Covering MD. 30 tablet 0   augmented betamethasone dipropionate (DIPROLENE-AF) 0.05 % ointment Apply topically.     cetirizine (ZYRTEC) 10 MG tablet Take 10 mg by mouth daily.     Clindamycin-Benzoyl Per, Refr, gel Apply 1 application topically daily.     escitalopram (LEXAPRO) 20 MG tablet TAKE 1 TABLET BY MOUTH EVERY DAY 30 tablet 0   Flaxseed, Linseed, (FLAX SEEDS PO) Take 1 tablet by mouth at bedtime.     fluticasone (FLONASE) 50 MCG/ACT nasal spray      ketorolac (TORADOL) 10 MG tablet Take 1 tablet (10 mg total) by mouth every 6 (six) hours as needed. 20 tablet 1   medroxyPROGESTERone (DEPO-PROVERA) 150 MG/ML injection      metFORMIN (GLUCOPHAGE) 500 MG tablet TAKE 1 TABLET BY MOUTH 2 TIMES DAILY WITH A MEAL. 60 tablet 0   Multiple Vitamin (MULTIVITAMIN) tablet Take 1 tablet by mouth daily.     mupirocin ointment (BACTROBAN) 2 % SMARTSIG:1 Application Topical 2-3 Times Daily     ondansetron (ZOFRAN-ODT) 4 MG disintegrating tablet Take 1-2 tablets (4-8 mg total) by mouth every 8 (eight) hours as needed for nausea. 30 tablet 1   promethazine (PHENERGAN) 25 MG tablet Take 1 tablet (25 mg total) by mouth every 6 (six) hours as needed for nausea or vomiting. 30 tablet 6   rizatriptan (MAXALT-MLT) 10 MG  disintegrating tablet Take 1 tablet (10 mg total) by mouth as needed for migraine. May repeat in 2 hours if needed 9 tablet 11   topiramate (TOPAMAX) 25 MG tablet TAKE 1 TABLET BY MOUTH EVERYDAY AT BEDTIME 30 tablet 0   traZODone (DESYREL) 50 MG tablet Take 1 tablet (50 mg total) by mouth at bedtime. 30 tablet 0   No current facility-administered medications for this visit.      Psychiatric Specialty Exam: Review of Systems  Cardiovascular:  Negative for chest pain.  Neurological:  Negative for headaches.  Psychiatric/Behavioral:  Negative for decreased concentration.    There were no vitals taken for this visit.There is no  height or weight on file to calculate BMI.  General Appearance: Casual  Eye Contact:  Fair  Speech:  Slow  Volume:  Normal  Mood:fair  Affect:  Congruent  Thought Process:  Goal Directed  Orientation:  Full (Time, Place, and Person)  Thought Content:  Logical  Suicidal Thoughts:  No  Homicidal Thoughts:  No  Memory:  Immediate;   Fair Recent;   Fair  Judgement:  Fair  Insight:  Fair  Psychomotor Activity:  Normal  Concentration:  Concentration: Fair and Attention Span: variable  Recall:  Good  Fund of Knowledge:Good  Language: Good  Akathisia:  No  Handed:   AIMS (if indicated):  not done  Assets:  Desire for Improvement Housing  ADL's:  Intact  Cognition: WNL  Sleep:  Fair   Screenings: PHQ2-9    Flowsheet Row Office Visit from 12/03/2018 in [facility] Office Visit from 09/03/2017 in [facility]  PHQ-2 Total Score 5 0  PHQ-9 Total Score 13 --      Flowsheet Row Video Visit from 07/26/2021 in [facility] [place] Video Visit from 03/15/2021 in [facility] AT [place]  C-SSRS RISK CATEGORY No Risk No Risk       Assessment and Plan: as follows Prior documentation reviewed   ADHD; remains stable, continue adderall with drug  holidays  Generalized anxiety disorder; handling it fair, on lexapro from pcp Continue Fu 82m.   [name], MD 8/31/202210:17 AM

## 2021-08-09 ENCOUNTER — Other Ambulatory Visit (INDEPENDENT_AMBULATORY_CARE_PROVIDER_SITE_OTHER): Payer: Self-pay | Admitting: Family Medicine

## 2021-08-09 DIAGNOSIS — G4709 Other insomnia: Secondary | ICD-10-CM

## 2021-08-09 NOTE — Telephone Encounter (Signed)
LAST APPOINTMENT DATE: 07/24/21 NEXT APPOINTMENT DATE: 08/24/21   [id number] [place], [place] - [address] [place] [address] Phone: [phone] Fax: [phone]  Patient is requesting a refill of the following medications: Pending Prescriptions:                       Disp   Refills   traZODone (DESYREL) 50 MG tablet [Pharmacy*30 tab*0       Sig: TAKE 1 TABLET BY MOUTH EVERYDAY AT BEDTIME   Date last filled: 07/12/21 Previously prescribed by [facility]  Lab Results      Component                Value               Date                      HGBA1C                   5.8 (H)             02/08/2021                HGBA1C                   5.6                 10/12/2020                HGBA1C                   5.7 (H)             05/04/2020           Lab Results      Component                Value               Date                      LDLCALC                  104 (H)             02/08/2021                CREATININE               0.56 (L)            02/08/2021           Lab Results      Component                Value               Date                      VD25OH                   48.0                02/08/2021                VD25OH                   52.2  10/12/2020                VD25OH                   46.1                05/04/2020            BP Readings from Last 3 Encounters: 07/24/21 : 122/88 06/07/21 : 103/71 04/13/21 : 112/75

## 2021-08-09 NOTE — Telephone Encounter (Signed)
Last seen by Katy Danford, NP 

## 2021-08-14 ENCOUNTER — Other Ambulatory Visit (INDEPENDENT_AMBULATORY_CARE_PROVIDER_SITE_OTHER): Payer: Self-pay | Admitting: Adult Health

## 2021-08-14 ENCOUNTER — Encounter (INDEPENDENT_AMBULATORY_CARE_PROVIDER_SITE_OTHER): Payer: Self-pay

## 2021-08-14 DIAGNOSIS — F3289 Other specified depressive episodes: Secondary | ICD-10-CM

## 2021-08-14 NOTE — Telephone Encounter (Signed)
Message sent to pt.

## 2021-08-16 ENCOUNTER — Other Ambulatory Visit: Payer: Self-pay | Admitting: Physician Assistant

## 2021-08-16 ENCOUNTER — Other Ambulatory Visit (INDEPENDENT_AMBULATORY_CARE_PROVIDER_SITE_OTHER): Payer: Self-pay | Admitting: Adult Health

## 2021-08-16 DIAGNOSIS — F3289 Other specified depressive episodes: Secondary | ICD-10-CM

## 2021-08-16 NOTE — Telephone Encounter (Signed)
Mychart message sent to patient.

## 2021-08-16 NOTE — Telephone Encounter (Signed)
LAST APPOINTMENT DATE: 07/24/21 NEXT APPOINTMENT DATE: 08/24/21   [id number] [place], [place] - [address] [place] [address] Phone: [phone] Fax: [phone]  Patient is requesting a refill of the following medications: Requested Prescriptions   Pending Prescriptions Disp Refills   escitalopram (LEXAPRO) 20 MG tablet [Pharmacy Med Name: ESCITALOPRAM 20 MG TABLET] 30 tablet 0    Sig: TAKE 1 TABLET BY MOUTH EVERY DAY    Date last filled: 07/17/21 Previously prescribed by [facility]  Lab Results  Component Value Date   HGBA1C 5.8 (H) 02/08/2021   HGBA1C 5.6 10/12/2020   HGBA1C 5.7 (H) 05/04/2020   Lab Results  Component Value Date   LDLCALC 104 (H) 02/08/2021   CREATININE 0.56 (L) 02/08/2021   Lab Results  Component Value Date   VD25OH 48.0 02/08/2021   VD25OH 52.2 10/12/2020   VD25OH 46.1 05/04/2020    BP Readings from Last 3 Encounters:  07/24/21 122/88  06/07/21 103/71  04/13/21 112/75

## 2021-08-17 ENCOUNTER — Telehealth (HOSPITAL_COMMUNITY): Payer: Self-pay

## 2021-08-17 MED ORDER — AMPHETAMINE-DEXTROAMPHETAMINE 10 MG PO TABS: 10.0000 mg | ORAL_TABLET | Freq: Every day | ORAL | 0 refills | Status: DC

## 2021-08-17 NOTE — Telephone Encounter (Signed)
Medication refill - [facility]phone call with [facility] in [place] after pt left a message requesting a new Adderall 10 mg ordre be sent into her pharmacy.  Pharmacy technician verified patient last picked up order on 07/23/21 and would be due a refill on 08/22/21 and has no current orders to fill.  Patient last evaluated on 07/26/21 and returns next on 11/10/21.

## 2021-08-24 ENCOUNTER — Ambulatory Visit (INDEPENDENT_AMBULATORY_CARE_PROVIDER_SITE_OTHER): Payer: 59 | Admitting: Adult Health

## 2021-08-24 ENCOUNTER — Other Ambulatory Visit: Payer: Self-pay

## 2021-08-24 ENCOUNTER — Encounter (INDEPENDENT_AMBULATORY_CARE_PROVIDER_SITE_OTHER): Payer: Self-pay | Admitting: Adult Health

## 2021-08-24 VITALS — BP 103/71 | HR 95 | Temp 98.0°F | Ht 69.0 in | Wt 259.0 lb

## 2021-08-24 DIAGNOSIS — E782 Mixed hyperlipidemia: Secondary | ICD-10-CM | POA: Diagnosis not present

## 2021-08-24 DIAGNOSIS — G4709 Other insomnia: Secondary | ICD-10-CM | POA: Diagnosis not present

## 2021-08-24 DIAGNOSIS — R7303 Prediabetes: Secondary | ICD-10-CM | POA: Diagnosis not present

## 2021-08-24 DIAGNOSIS — R7401 Elevation of levels of liver transaminase levels: Secondary | ICD-10-CM | POA: Diagnosis not present

## 2021-08-24 DIAGNOSIS — Z9189 Other specified personal risk factors, not elsewhere classified: Secondary | ICD-10-CM

## 2021-08-24 DIAGNOSIS — E559 Vitamin D deficiency, unspecified: Secondary | ICD-10-CM | POA: Diagnosis not present

## 2021-08-24 DIAGNOSIS — E669 Obesity, unspecified: Secondary | ICD-10-CM

## 2021-08-24 MED ORDER — TRAZODONE HCL 50 MG PO TABS: ORAL_TABLET | ORAL | 0 refills | Status: DC

## 2021-08-24 NOTE — Progress Notes (Signed)
Chief Complaint:   OBESITY Miranda Hunter is here to discuss her progress with her obesity treatment plan along with follow-up of her obesity related diagnoses. Miranda Hunter is on the Category 3 Plan and states she is following her eating plan approximately 70% of the time. [name] states she is walking for 60 minutes 7 times per week.  Today's visit was #: 42 Starting weight: 257 lbs Starting date: 12/03/2018 Today's weight: 259 lbs Today's date: 08/24/2021 Total lbs lost to date: 0 Total lbs lost since last in-office visit: 1 lb  Interim History: Miranda Hunter says that the funeral for her grandfather will be next weekend. She has been working through her grief. She reports a strong emotional support system.  Subjective:   1. Prediabetes On metformin 500 mg BID - denies GI upset.  2. Other insomnia She is able to fall and remain asleep with trazodone. She denies am hangover felling.  3. Hyperlipidemia, mixed [name] is not on statin therapy.  4. Transaminitis She denies RUQ pain.  5. Vitamin D deficiency On 02/08/2021, vitamin D level - 48.0 - stable.  6. At risk for impaired function of liver Miranda Hunter is at risk for impaired function of liver due to transaminitis and obeisty.    Assessment/Plan:   1. Prediabetes Check labs today. Darah will continue to work on weight loss, exercise, and decreasing simple carbohydrates to help decrease the risk of diabetes.   - Hemoglobin A1c - Insulin, random - Vitamin B12  2. Other insomnia Refill trazodone 50 mg at bedtime as needed for insomnia, as per below. The problem of recurrent insomnia was discussed. Orders and follow up as documented in patient record. Counseling: Intensive lifestyle modifications are the first line treatment for this issue. We discussed several lifestyle modifications today and she will continue to work on diet, exercise and weight loss efforts.   Counseling Limit or avoid alcohol, caffeinated beverages,  and cigarettes, especially close to bedtime.  Do not eat a large meal or eat spicy foods right before bedtime. This can lead to digestive discomfort that can make it hard for you to sleep. Keep a sleep diary to help you and your health care provider figure out what could be causing your insomnia.  Make your bedroom a dark, comfortable place where it is easy to fall asleep. Put up shades or blackout curtains to block light from outside. Use a white noise machine to block noise. Keep the temperature cool. Limit screen use before bedtime. This includes: Watching TV. Using your smartphone, tablet, or computer. Stick to a routine that includes going to bed and waking up at the same times every day and night. This can help you fall asleep faster. Consider making a quiet activity, such as reading, part of your nighttime routine. Try to avoid taking naps during the day so that you sleep better at night. Get out of bed if you are still awake after 15 minutes of trying to sleep. Keep the lights down, but try reading or doing a quiet activity. When you feel sleepy, go back to bed.  - Refill traZODone (DESYREL) 50 MG tablet; TAKE 1 TABLET BY MOUTH EVERYDAY AT BEDTIME  Dispense: 90 tablet; Refill: 0  3. Hyperlipidemia, mixed Check labs today. Cardiovascular risk and specific lipid/LDL goals reviewed.  We discussed several lifestyle modifications today and Paytan will continue to work on diet, exercise and weight loss efforts. Orders and follow up as documented in patient record.   Counseling Intensive lifestyle modifications are the  first line treatment for this issue. Dietary changes: Increase soluble fiber. Decrease simple carbohydrates. Exercise changes: Moderate to vigorous-intensity aerobic activity 150 minutes per week if tolerated. Lipid-lowering medications: see documented in medical record.  - Lipid panel  4. Transaminitis Will check labs today.  - Comprehensive metabolic panel  5.  Vitamin D deficiency Check vitamin D level today.  - VITAMIN D 25 Hydroxy (Vit-D Deficiency, Fractures)  6. At risk for impaired function of liver Miranda Hunter was given approximately 15 minutes of counseling today regarding prevention of impaired liver function. Miranda Hunter was educated about her risk of developing NASH or even liver failure and advised that the only proven treatment for NAFLD was weight loss of at least 5-10% of body weight.    7. Obesity with current BMI 38.3  [name] is currently in the action stage of change. As such, her goal is to continue with weight loss efforts. She has agreed to the Category 3 Plan.   Exercise goals:  As is.  Behavioral modification strategies: increasing lean protein intake, decreasing simple carbohydrates, meal planning and cooking strategies, keeping healthy foods in the home, and planning for success.  Miranda Hunter has agreed to follow-up with our clinic in 4 weeks. She was informed of the importance of frequent follow-up visits to maximize her success with intensive lifestyle modifications for her multiple health conditions.   Miranda Hunter was informed we would discuss her lab results at her next visit unless there is a critical issue that needs to be addressed sooner. Miranda Hunter agreed to keep her next visit at the agreed upon time to discuss these results.  Objective:   Blood pressure 103/71, pulse 95, temperature 98 F (36.7 C), height 5\' 9"  (1.753 m), weight 259 lb (117.5 kg), SpO2 98 %. Body mass index is 38.25 kg/m.  General: Cooperative, alert, well developed, in no acute distress. HEENT: Conjunctivae and lids unremarkable. Cardiovascular: Regular rhythm.  Lungs: Normal work of breathing. Neurologic: No focal deficits.   Lab Results  Component Value Date   CREATININE 0.56 (L) 02/08/2021   BUN 5 (L) 02/08/2021   NA 139 02/08/2021   K 4.5 02/08/2021   CL 103 02/08/2021   CO2 19 (L) 02/08/2021   Lab Results  Component Value Date   ALT  19 02/08/2021   AST 20 02/08/2021   ALKPHOS 108 02/08/2021   BILITOT 0.3 02/08/2021   Lab Results  Component Value Date   HGBA1C 5.8 (H) 02/08/2021   HGBA1C 5.6 10/12/2020   HGBA1C 5.7 (H) 05/04/2020   HGBA1C 5.4 10/26/2019   HGBA1C 5.5 06/08/2019   Lab Results  Component Value Date   INSULIN 38.2 (H) 02/08/2021   INSULIN 39.4 (H) 10/12/2020   INSULIN 31.0 (H) 05/04/2020   INSULIN 36.1 (H) 10/26/2019   INSULIN 33.7 (H) 06/08/2019   Lab Results  Component Value Date   TSH 1.260 05/04/2020   Lab Results  Component Value Date   CHOL 170 02/08/2021   HDL 36 (L) 02/08/2021   LDLCALC 104 (H) 02/08/2021   TRIG 168 (H) 02/08/2021   CHOLHDL 4.7 (H) 02/08/2021   Lab Results  Component Value Date   VD25OH 48.0 02/08/2021   VD25OH 52.2 10/12/2020   VD25OH 46.1 05/04/2020   Lab Results  Component Value Date   WBC 9.8 12/03/2018   HGB 13.8 12/03/2018   HCT 42.9 12/03/2018   MCV 90 12/03/2018   PLT 445.0 (H) 09/03/2017   Attestation Statements:   Reviewed by clinician on day of visit: allergies,  medications, problem list, medical history, surgical history, family history, social history, and previous encounter notes.  I, Insurance claims handler, CMA, am acting as Energy manager for William Hamburger, NP.  I have reviewed the above documentation for accuracy and completeness, and I agree with the above. -  Domenica Weightman d. Harjas Biggins, NP-C

## 2021-08-25 LAB — COMPREHENSIVE METABOLIC PANEL
ALT: 19 IU/L (ref 0–32)
AST: 16 IU/L (ref 0–40)
Albumin/Globulin Ratio: 2.4 — ABNORMAL HIGH (ref 1.2–2.2)
Albumin: 4.7 g/dL (ref 3.8–4.8)
Alkaline Phosphatase: 139 IU/L — ABNORMAL HIGH (ref 44–121)
BUN/Creatinine Ratio: 10 (ref 9–23)
BUN: 8 mg/dL (ref 6–20)
Bilirubin Total: 0.3 mg/dL (ref 0.0–1.2)
CO2: 17 mmol/L — ABNORMAL LOW (ref 20–29)
Calcium: 10.1 mg/dL (ref 8.7–10.2)
Chloride: 102 mmol/L (ref 96–106)
Creatinine, Ser: 0.78 mg/dL (ref 0.57–1.00)
Globulin, Total: 2 g/dL (ref 1.5–4.5)
Glucose: 93 mg/dL (ref 70–99)
Potassium: 4.7 mmol/L (ref 3.5–5.2)
Sodium: 137 mmol/L (ref 134–144)
Total Protein: 6.7 g/dL (ref 6.0–8.5)
eGFR: 103 mL/min/{1.73_m2} (ref >59)

## 2021-08-25 LAB — VITAMIN B12: Vitamin B-12: 298 pg/mL (ref 232–1245)

## 2021-08-25 LAB — LIPID PANEL
Chol/HDL Ratio: 5 ratio — ABNORMAL HIGH (ref 0.0–4.4)
Cholesterol, Total: 196 mg/dL (ref 100–199)
HDL: 39 mg/dL — ABNORMAL LOW (ref >39)
LDL Chol Calc (NIH): 129 mg/dL — ABNORMAL HIGH (ref 0–99)
Triglycerides: 157 mg/dL — ABNORMAL HIGH (ref 0–149)
VLDL Cholesterol Cal: 28 mg/dL (ref 5–40)

## 2021-08-25 LAB — VITAMIN D 25 HYDROXY (VIT D DEFICIENCY, FRACTURES): Vit D, 25-Hydroxy: 45.3 ng/mL (ref 30.0–100.0)

## 2021-08-25 LAB — INSULIN, RANDOM: INSULIN: 33.6 u[IU]/mL — ABNORMAL HIGH (ref 2.6–24.9)

## 2021-08-25 LAB — HEMOGLOBIN A1C
Est. average glucose Bld gHb Est-mCnc: 126 mg/dL
Hgb A1c MFr Bld: 6 % — ABNORMAL HIGH (ref 4.8–5.6)

## 2021-09-11 ENCOUNTER — Other Ambulatory Visit (INDEPENDENT_AMBULATORY_CARE_PROVIDER_SITE_OTHER): Payer: Self-pay | Admitting: Adult Health

## 2021-09-11 DIAGNOSIS — F3289 Other specified depressive episodes: Secondary | ICD-10-CM

## 2021-09-11 NOTE — Telephone Encounter (Signed)
LAST APPOINTMENT DATE: 08/24/21 NEXT APPOINTMENT DATE: 09/21/21   [id number] [place], [place] - [address] [place] [address] Phone: [phone] Fax: [phone]  Patient is requesting a refill of the following medications: Pending Prescriptions:                       Disp   Refills   escitalopram (LEXAPRO) 20 MG tablet [Pharm*90 tab*1       Sig: TAKE 1 TABLET BY MOUTH EVERY DAY   Date last filled: 08/16/21 Previously prescribed by [facility]  Lab Results      Component                Value               Date                      HGBA1C                   6.0 (H)             08/24/2021                HGBA1C                   5.8 (H)             02/08/2021                HGBA1C                   5.6                 10/12/2020           Lab Results      Component                Value               Date                      LDLCALC                  129 (H)             08/24/2021                CREATININE               0.78                08/24/2021           Lab Results      Component                Value               Date                      VD25OH                   45.3                08/24/2021                VD25OH                   48.0  02/08/2021                VD25OH                   52.2                10/12/2020            BP Readings from Last 3 Encounters: 08/24/21 : 103/71 07/24/21 : 122/88 06/07/21 : 103/71

## 2021-09-14 ENCOUNTER — Telehealth (HOSPITAL_COMMUNITY): Payer: Self-pay | Admitting: Psychiatry

## 2021-09-14 MED ORDER — AMPHETAMINE-DEXTROAMPHETAMINE 10 MG PO TABS: 10.0000 mg | ORAL_TABLET | Freq: Every day | ORAL | 0 refills | Status: DC

## 2021-09-14 NOTE — Telephone Encounter (Signed)
Pt needs refill on adderall [facility] [address]

## 2021-09-21 ENCOUNTER — Encounter (INDEPENDENT_AMBULATORY_CARE_PROVIDER_SITE_OTHER): Payer: Self-pay | Admitting: Adult Health

## 2021-09-21 ENCOUNTER — Other Ambulatory Visit: Payer: Self-pay

## 2021-09-21 ENCOUNTER — Ambulatory Visit (INDEPENDENT_AMBULATORY_CARE_PROVIDER_SITE_OTHER): Payer: 59 | Admitting: Adult Health

## 2021-09-21 VITALS — BP 117/77 | HR 114 | Temp 98.4°F | Ht 69.0 in | Wt 263.0 lb

## 2021-09-21 DIAGNOSIS — E669 Obesity, unspecified: Secondary | ICD-10-CM

## 2021-09-21 DIAGNOSIS — G4709 Other insomnia: Secondary | ICD-10-CM | POA: Diagnosis not present

## 2021-09-21 DIAGNOSIS — E559 Vitamin D deficiency, unspecified: Secondary | ICD-10-CM | POA: Diagnosis not present

## 2021-09-21 DIAGNOSIS — E782 Mixed hyperlipidemia: Secondary | ICD-10-CM

## 2021-09-21 DIAGNOSIS — Z9189 Other specified personal risk factors, not elsewhere classified: Secondary | ICD-10-CM

## 2021-09-21 DIAGNOSIS — E8881 Metabolic syndrome: Secondary | ICD-10-CM

## 2021-09-21 MED ORDER — METFORMIN HCL 500 MG PO TABS: 1000.0000 mg | ORAL_TABLET | Freq: Two times a day (BID) | ORAL | 0 refills | Status: DC

## 2021-09-21 MED ORDER — METFORMIN HCL 1000 MG PO TABS: 1000.0000 mg | ORAL_TABLET | Freq: Two times a day (BID) | ORAL | 0 refills | Status: DC

## 2021-09-21 MED ORDER — TRAZODONE HCL 50 MG PO TABS: ORAL_TABLET | ORAL | 0 refills | Status: DC

## 2021-09-21 NOTE — Progress Notes (Signed)
Chief Complaint:   OBESITY Miranda Hunter is here to discuss her progress with her obesity treatment plan along with follow-up of her obesity related diagnoses. Miranda Hunter is on the Category 3 Plan and states she is following her eating plan approximately 80% of the time. Miranda Hunter states she is walking for 60 minutes 7 times per week.  Today's visit was #: 43 Starting weight: 257 lbs Starting date: 12/03/2018 Today's weight: 263 lbs Today's date: 09/21/2021 Total lbs lost to date: 0 Total lbs lost since last in-office visit: 0  Interim History: Miranda Hunter has been on Depo Provera for 1 year - weight steadily increasing since then.   She is frustrated with the lack of results despite >70% compliance on Category 3 and walking 60 minutes everyday. She has f/u with her OB/GYN this Dec- will discuss Depo removal.  Subjective:   1. Insulin resistance Discussed labs with patient today.  On 08/24/2021, A1c worsened insulin level, slightly improved GFR >60. She has been on metformin 500 mg BID - denies GI upset. Her mother had thyroid cancer - unsure of specific type. She denies personal history of pancreatitis.   She has experienced nausea with many medications.  2. Other insomnia Discussed labs with patient today.  PDMP reviewed - no aberrancies noted. She is on trazodone 50 mg at bedtime.  3. Hyperlipidemia, mixed Discussed labs with patient today.  On 08/24/2021, lipid panel - worsened LDL with improved HDL.  4. Vitamin D deficiency Discussed labs with patient today.  Vitamin D level on 08/24/2021 - 45.3 - just below goal of 50. Unable to tolerate ergocalciferol - she is on an OTC multivitamin.  5. At risk for diarrhea Miranda Hunter is at higher risk of diarrhea due to increasing metformin dose for worsening prediabetes.  Assessment/Plan:   1. Insulin resistance Refill and increase metformin to 1000 mg BID with meals.  - Refill and increase metFORMIN (GLUCOPHAGE) 1000 MG tablet; Take  1 tablet (1,000 mg total) by mouth 2 (two) times daily with a meal.  Dispense: 60 tablet; Refill: 0  2. Other insomnia Refill trazodone 50 mg at bedtime, as per below.  - Refill traZODone (DESYREL) 50 MG tablet; TAKE 1 TABLET BY MOUTH EVERYDAY AT BEDTIME  Dispense: 90 tablet; Refill: 0  3. Hyperlipidemia, mixed Continue to decrease saturated fat and increase regular walking.  4. Vitamin D deficiency Continue OTC multivitamin.  5. At risk for diarrhea Miranda Hunter was given approximately 15 minutes of diarrhea prevention counseling today. She is 35 y.o. female and has risk factors for diarrhea including medications and changes in diet. We discussed intensive lifestyle modifications today with an emphasis on specific weight loss instructions including dietary strategies.   Repetitive spaced learning was employed today to elicit superior memory formation and behavioral change.   6. Obesity with current BMI 38.9  Miranda Hunter is currently in the action stage of change. As such, her goal is to continue with weight loss efforts. She has agreed to the Category 3 Plan.   Check IC at next office visit.  Exercise goals:  As is.  Behavioral modification strategies: increasing lean protein intake, decreasing simple carbohydrates, meal planning and cooking strategies, keeping healthy foods in the home, and planning for success.  Aki has agreed to follow-up with our clinic in 5 weeks, fasting for IC. She was informed of the importance of frequent follow-up visits to maximize her success with intensive lifestyle modifications for her multiple health conditions.   Objective:   Blood pressure 117/77,  pulse (!) 114, temperature 98.4 F (36.9 C), height 5\' 9"  (1.753 m), weight 263 lb (119.3 kg), SpO2 97 %. Body mass index is 38.84 kg/m.  General: Cooperative, alert, well developed, in no acute distress. HEENT: Conjunctivae and lids unremarkable. Cardiovascular: Regular rhythm.  Lungs: Normal work  of breathing. Neurologic: No focal deficits.   Lab Results  Component Value Date   CREATININE 0.78 08/24/2021   BUN 8 08/24/2021   NA 137 08/24/2021   K 4.7 08/24/2021   CL 102 08/24/2021   CO2 17 (L) 08/24/2021   Lab Results  Component Value Date   ALT 19 08/24/2021   AST 16 08/24/2021   ALKPHOS 139 (H) 08/24/2021   BILITOT 0.3 08/24/2021   Lab Results  Component Value Date   HGBA1C 6.0 (H) 08/24/2021   HGBA1C 5.8 (H) 02/08/2021   HGBA1C 5.6 10/12/2020   HGBA1C 5.7 (H) 05/04/2020   HGBA1C 5.4 10/26/2019   Lab Results  Component Value Date   INSULIN 33.6 (H) 08/24/2021   INSULIN 38.2 (H) 02/08/2021   INSULIN 39.4 (H) 10/12/2020   INSULIN 31.0 (H) 05/04/2020   INSULIN 36.1 (H) 10/26/2019   Lab Results  Component Value Date   TSH 1.260 05/04/2020   Lab Results  Component Value Date   CHOL 196 08/24/2021   HDL 39 (L) 08/24/2021   LDLCALC 129 (H) 08/24/2021   TRIG 157 (H) 08/24/2021   CHOLHDL 5.0 (H) 08/24/2021   Lab Results  Component Value Date   VD25OH 45.3 08/24/2021   VD25OH 48.0 02/08/2021   VD25OH 52.2 10/12/2020   Lab Results  Component Value Date   WBC 9.8 12/03/2018   HGB 13.8 12/03/2018   HCT 42.9 12/03/2018   MCV 90 12/03/2018   PLT 445.0 (H) 09/03/2017   Attestation Statements:   Reviewed by clinician on day of visit: allergies, medications, problem list, medical history, surgical history, family history, social history, and previous encounter notes.  I, 11/03/2017, CMA, am acting as Insurance claims handler for Energy manager, NP.  I have reviewed the above documentation for accuracy and completeness, and I agree with the above. -  Amandeep Nesmith d. Elva Breaker, NP-C

## 2021-09-27 ENCOUNTER — Other Ambulatory Visit: Payer: Self-pay

## 2021-09-27 ENCOUNTER — Ambulatory Visit (INDEPENDENT_AMBULATORY_CARE_PROVIDER_SITE_OTHER): Payer: 59

## 2021-09-27 DIAGNOSIS — Z23 Encounter for immunization: Secondary | ICD-10-CM | POA: Diagnosis not present

## 2021-10-04 ENCOUNTER — Encounter: Payer: Self-pay | Admitting: Physician Assistant

## 2021-10-04 ENCOUNTER — Telehealth (INDEPENDENT_AMBULATORY_CARE_PROVIDER_SITE_OTHER): Payer: 59 | Admitting: Physician Assistant

## 2021-10-04 ENCOUNTER — Other Ambulatory Visit: Payer: Self-pay

## 2021-10-04 ENCOUNTER — Telehealth: Payer: Self-pay

## 2021-10-04 VITALS — Ht 69.0 in | Wt 263.0 lb

## 2021-10-04 DIAGNOSIS — R051 Acute cough: Secondary | ICD-10-CM

## 2021-10-04 MED ORDER — HYDROCOD POLST-CPM POLST ER 10-8 MG/5ML PO SUER: 5.0000 mL | Freq: Every evening | ORAL | 0 refills | Status: DC | PRN

## 2021-10-04 MED ORDER — PREDNISONE 20 MG PO TABS: 40.0000 mg | ORAL_TABLET | Freq: Every day | ORAL | 0 refills | Status: DC

## 2021-10-04 MED ORDER — AZITHROMYCIN 250 MG PO TABS: ORAL_TABLET | ORAL | 0 refills | Status: AC

## 2021-10-04 MED ORDER — ALBUTEROL SULFATE HFA 108 (90 BASE) MCG/ACT IN AERS: 2.0000 | INHALATION_SPRAY | Freq: Four times a day (QID) | RESPIRATORY_TRACT | 2 refills | Status: DC | PRN

## 2021-10-04 NOTE — Telephone Encounter (Signed)
Please see message. °

## 2021-10-04 NOTE — Telephone Encounter (Signed)
Patient has called in stating she has congestion, cough and sinus pressure.   States she received her flu vac on 11/2.  States symptoms started on 11/3.  States she tested for COVID on 11/ 4 and was negative.  I have scheduled patient for OV with [facility] on 11/10.   Patient would like to know if she could be worked in this evening.    If not she would like to know of [name] could call in a cough syrup for her to get through tonight until her appt tomorrow?

## 2021-10-04 NOTE — Progress Notes (Signed)
Virtual Visit via Video   I connected with Miranda Hunter on 10/04/21 at  4:00 PM EST by a video enabled telemedicine application and verified that I am speaking with the correct person using two identifiers. Location patient: Home Location provider: [facility], Office Persons participating in the virtual visit: Miranda Hunter, [place] PA-C, Corky Mull, LPN   I discussed the limitations of evaluation and management by telemedicine and the availability of in person appointments. The patient expressed understanding and agreed to proceed.  I acted as a Neurosurgeon for [employer], PA-C [employer], LPN   Subjective:   HPI:   Cough Pt c/o symptoms started scratchy throat on 10/31. Had Flu shot on 11/2. Then started with cough and congestion on Thursday 11/3. COVID test done on Friday 11/4 was Neg. Has been having some chills, did wake up yesterday morning pool of sweat not sure if had fever during the night. She is using Mucinex, tried Tessalon pearles and Delsym. Denies: vomiting, diarrhea, chest pain.  She is traveling to see her sister who is currently pregnant with twins, on Friday.  ROS: See pertinent positives and negatives per HPI.  Patient Active Problem List   Diagnosis Date Noted   Vitamin D deficiency 11/09/2020   Attention deficit hyperactivity disorder (ADHD) 10/14/2020   Prediabetes 06/06/2020   Transaminitis 06/06/2020   Hyperlipidemia, mixed 06/06/2020   Other insomnia 06/06/2020   Cervical intraepithelial neoplasia grade 1 04/27/2020   Insulin resistance 10/01/2019   Depression 04/28/2019   Class 2 obesity due to excess calories with body mass index (BMI) of 37.0 to 37.9 in adult 04/28/2019   Migraine with aura and without status migrainosus, not intractable 10/14/2018   Obesity (BMI 30-39.9) 10/14/2018   Moderate persistent asthma 09/16/2018    Social History   Tobacco Use   Smoking status: Never   Smokeless tobacco: Never  Substance  Use Topics   Alcohol use: Yes    Comment: socially    Current Outpatient Medications:    amphetamine-dextroamphetamine (ADDERALL) 10 MG tablet, Take 1 tablet (10 mg total) by mouth daily. Thirty days bridge supply given by Covering MD., Disp: 30 tablet, Rfl: 0   augmented betamethasone dipropionate (DIPROLENE-AF) 0.05 % ointment, Apply topically., Disp: , Rfl:    azithromycin (ZITHROMAX) 250 MG tablet, Take 2 tablets on day 1, then 1 tablet daily on days 2 through 5, Disp: 6 tablet, Rfl: 0   cetirizine (ZYRTEC) 10 MG tablet, Take 10 mg by mouth daily., Disp: , Rfl:    chlorpheniramine-HYDROcodone (TUSSIONEX PENNKINETIC ER) 10-8 MG/5ML SUER, Take 5 mLs by mouth at bedtime as needed for cough., Disp: 115 mL, Rfl: 0   Clindamycin-Benzoyl Per, Refr, gel, Apply 1 application topically daily., Disp: , Rfl:    escitalopram (LEXAPRO) 20 MG tablet, TAKE 1 TABLET BY MOUTH EVERY DAY, Disp: 90 tablet, Rfl: 1   Flaxseed, Linseed, (FLAX SEEDS PO), Take 1 tablet by mouth at bedtime., Disp: , Rfl:    fluticasone (FLONASE) 50 MCG/ACT nasal spray, , Disp: , Rfl:    medroxyPROGESTERone (DEPO-PROVERA) 150 MG/ML injection, , Disp: , Rfl:    metFORMIN (GLUCOPHAGE) 1000 MG tablet, Take 1 tablet (1,000 mg total) by mouth 2 (two) times daily with a meal., Disp: 60 tablet, Rfl: 0   Multiple Vitamin (MULTIVITAMIN) tablet, Take 1 tablet by mouth daily., Disp: , Rfl:    mupirocin ointment (BACTROBAN) 2 %, SMARTSIG:1 Application Topical 2-3 Times Daily, Disp: , Rfl:    ondansetron (ZOFRAN-ODT) 4 MG  disintegrating tablet, TAKE 1-2 TABLETS BY MOUTH EVERY 8 HOURS AS NEEDED FOR NAUSEA., Disp: 30 tablet, Rfl: 1   predniSONE (DELTASONE) 20 MG tablet, Take 2 tablets (40 mg total) by mouth daily., Disp: 10 tablet, Rfl: 0   promethazine (PHENERGAN) 25 MG tablet, Take 1 tablet (25 mg total) by mouth every 6 (six) hours as needed for nausea or vomiting., Disp: 30 tablet, Rfl: 6   rizatriptan (MAXALT-MLT) 10 MG disintegrating tablet,  Take 1 tablet (10 mg total) by mouth as needed for migraine. May repeat in 2 hours if needed, Disp: 9 tablet, Rfl: 11   traZODone (DESYREL) 50 MG tablet, TAKE 1 TABLET BY MOUTH EVERYDAY AT BEDTIME, Disp: 90 tablet, Rfl: 0   albuterol (VENTOLIN HFA) 108 (90 Base) MCG/ACT inhaler, Inhale 2 puffs into the lungs every 6 (six) hours as needed for wheezing or shortness of breath., Disp: 18 g, Rfl: 2  Allergies  Allergen Reactions   Amoxicillin-Pot Clavulanate Diarrhea    Objective:   VITALS: Per patient if applicable, see vitals. GENERAL: Alert, appears well and in no acute distress. HEENT: Atraumatic, conjunctiva clear, no obvious abnormalities on inspection of external nose and ears. NECK: Normal movements of the head and neck. CARDIOPULMONARY: No increased WOB. Speaking in clear sentences. I:E ratio WNL.  MS: Moves all visible extremities without noticeable abnormality. PSYCH: Pleasant and cooperative, well-groomed. Speech normal rate and rhythm. Affect is appropriate. Insight and judgement are appropriate. Attention is focused, linear, and appropriate.  NEURO: CN grossly intact. Oriented as arrived to appointment on time with no prompting. Moves both UE equally.  SKIN: No obvious lesions, wounds, erythema, or cyanosis noted on face or hands.  Assessment and Plan:   Miranda Hunter was seen today for cough.  Diagnoses and all orders for this visit:  Acute cough -     POCT Influenza A/B; Future  Other orders -     chlorpheniramine-HYDROcodone (TUSSIONEX PENNKINETIC ER) 10-8 MG/5ML SUER; Take 5 mLs by mouth at bedtime as needed for cough. -     azithromycin (ZITHROMAX) 250 MG tablet; Take 2 tablets on day 1, then 1 tablet daily on days 2 through 5 -     predniSONE (DELTASONE) 20 MG tablet; Take 2 tablets (40 mg total) by mouth daily. -     albuterol (VENTOLIN HFA) 108 (90 Base) MCG/ACT inhaler; Inhale 2 puffs into the lungs every 6 (six) hours as needed for wheezing or shortness of  breath.  No red flags on discussion, patient is not in any obvious distress during our visit. Discussed progression of most viral illness, and recommended supportive care at this point in time. Trial tussionex cough syrup to help with symptoms at nighttime so she can get rest. Albuterol inhaler refilled. I did however provide pocket rx for oral prednisone and azithromycin should symptoms not improve as anticipated. Discussed over the counter supportive care options, with recommendations to push fluids and rest. Reviewed return precautions including new/worsening fever, SOB, new/worsening cough or other concerns.  Recommended need to self-quarantine and practice social distancing until symptoms resolve.  She is considering a flu test -- I have put future orders in and discussed that if she would like to be tested to call our office to schedule this in our parking lot.  I recommend that patient follow-up if symptoms worsen or persist despite treatment x 7-10 days, sooner if needed.  I discussed the assessment and treatment plan with the patient. The patient was provided an opportunity to ask questions and  all were answered. The patient agreed with the plan and demonstrated an understanding of the instructions.   The patient was advised to call back or seek an in-person evaluation if the symptoms worsen or if the condition fails to improve as anticipated.   CMA or LPN served as scribe during this visit. History, Physical, and Plan performed by medical provider. The above documentation has been reviewed and is accurate and complete.   [place], [place] 10/04/2021

## 2021-10-05 ENCOUNTER — Ambulatory Visit: Payer: 59 | Admitting: Physician Assistant

## 2021-10-12 ENCOUNTER — Telehealth (HOSPITAL_COMMUNITY): Payer: Self-pay

## 2021-10-12 MED ORDER — AMPHETAMINE-DEXTROAMPHETAMINE 10 MG PO TABS: 10.0000 mg | ORAL_TABLET | Freq: Every day | ORAL | 0 refills | Status: DC

## 2021-10-12 NOTE — Telephone Encounter (Signed)
Patient needs a refill on [facility] [address] in [place]

## 2021-10-13 ENCOUNTER — Other Ambulatory Visit: Payer: Self-pay | Admitting: Physician Assistant

## 2021-10-26 ENCOUNTER — Other Ambulatory Visit (INDEPENDENT_AMBULATORY_CARE_PROVIDER_SITE_OTHER): Payer: Self-pay | Admitting: Adult Health

## 2021-10-26 ENCOUNTER — Encounter (INDEPENDENT_AMBULATORY_CARE_PROVIDER_SITE_OTHER): Payer: Self-pay | Admitting: Emergency Medicine

## 2021-10-26 DIAGNOSIS — E8881 Metabolic syndrome: Secondary | ICD-10-CM

## 2021-10-26 NOTE — Telephone Encounter (Signed)
Message has been sent to patient to see if she have enough medication to last till her appt on 10/30/21

## 2021-10-30 ENCOUNTER — Ambulatory Visit (INDEPENDENT_AMBULATORY_CARE_PROVIDER_SITE_OTHER): Payer: 59 | Admitting: Adult Health

## 2021-10-30 ENCOUNTER — Encounter (INDEPENDENT_AMBULATORY_CARE_PROVIDER_SITE_OTHER): Payer: Self-pay | Admitting: Adult Health

## 2021-10-30 ENCOUNTER — Other Ambulatory Visit: Payer: Self-pay

## 2021-10-30 VITALS — BP 118/75 | HR 95 | Temp 98.0°F | Ht 69.0 in | Wt 265.0 lb

## 2021-10-30 DIAGNOSIS — Z9189 Other specified personal risk factors, not elsewhere classified: Secondary | ICD-10-CM

## 2021-10-30 DIAGNOSIS — R7303 Prediabetes: Secondary | ICD-10-CM

## 2021-10-30 DIAGNOSIS — G4709 Other insomnia: Secondary | ICD-10-CM | POA: Diagnosis not present

## 2021-10-30 DIAGNOSIS — E669 Obesity, unspecified: Secondary | ICD-10-CM | POA: Diagnosis not present

## 2021-10-30 MED ORDER — TRAZODONE HCL 50 MG PO TABS: ORAL_TABLET | ORAL | 0 refills | Status: DC

## 2021-10-30 MED ORDER — OZEMPIC (0.25 OR 0.5 MG/DOSE) 2 MG/1.5ML ~~LOC~~ SOPN: 0.5000 mg | PEN_INJECTOR | SUBCUTANEOUS | 0 refills | Status: DC

## 2021-10-30 NOTE — Progress Notes (Signed)
Chief Complaint:   OBESITY Miranda Hunter is here to discuss her progress with her obesity treatment plan along with follow-up of her obesity related diagnoses. [name] is on the Category 3 Plan and states she is following her eating plan approximately 80% of the time. Miranda Hunter states she is walking for 60 minutes 7 times per week.  Today's visit was #: 44 Starting weight: 257 lbs Starting date: 12/03/2018 Today's weight: 265 lbs Today's date: 10/30/2021 Total lbs lost to date: 0 Total lbs lost since last in-office visit: 0  Interim History: Miranda Hunter is not fasting today. We are unable to complete IC measurement or fasting labs.   Subjective:   1. Other insomnia Miranda Hunter states she gets 8-9 hours of sound sleep with Trazodne 50 mg.  She denies am sedation.   2. Prediabetes Miranda Hunter denies family history of Medullary thyroid cancer or  personal hx of pancreatitis or cholelithiasis. She is unable to increase Metformin to 2,000 mg per day due to GI upset (cramps and loose stools).  Most days she takes one 1,000mg  tablet only.  3. At risk for diabetes mellitus Miranda Hunter is at risk for diabetes mellitus unable to tolerate prescribed dose of Metformin change to GLP-1.  Assessment/Plan:   1. Other insomnia The problem of recurrent insomnia was discussed. Orders and follow up as documented in patient record. Counseling: Intensive lifestyle modifications are the first line treatment for this issue. We will refill Trazodone 50 mg for 3 months with no refills. We discussed several lifestyle modifications today and she will continue to work on diet, exercise and weight loss efforts.   Counseling Limit or avoid alcohol, caffeinated beverages, and cigarettes, especially close to bedtime.  Do not eat a large meal or eat spicy foods right before bedtime. This can lead to digestive discomfort that can make it hard for you to sleep. Keep a sleep diary to help you and your health care provider  figure out what could be causing your insomnia.  Make your bedroom a dark, comfortable place where it is easy to fall asleep. Put up shades or blackout curtains to block light from outside. Use a white noise machine to block noise. Keep the temperature cool. Limit screen use before bedtime. This includes: Watching TV. Using your smartphone, tablet, or computer. Stick to a routine that includes going to bed and waking up at the same times every day and night. This can help you fall asleep faster. Consider making a quiet activity, such as reading, part of your nighttime routine. Try to avoid taking naps during the day so that you sleep better at night. Get out of bed if you are still awake after 15 minutes of trying to sleep. Keep the lights down, but try reading or doing a quiet activity. When you feel sleepy, go back to bed.   - traZODone (DESYREL) 50 MG tablet; TAKE 1 TABLET BY MOUTH EVERYDAY AT BEDTIME  Dispense: 90 tablet; Refill: 0  2. Prediabetes [name] agrees to start Ozempic 0.25 mg.  Once start she starts Ozempic, she will stop Metformin.  She will continue to work on weight loss, exercise, and decreasing simple carbohydrates to help decrease the risk of diabetes.   - Semaglutide,0.25 or 0.5MG /DOS, (OZEMPIC, 0.25 OR 0.5 MG/DOSE,) 2 MG/1.5ML SOPN; Inject 0.5 mg into the skin once a week.  Dispense: 2 mL; Refill: 0  3. At risk for diabetes mellitus Miranda Hunter was given approximately 15 minutes of diabetes education and counseling today. We discussed intensive lifestyle  modifications today with an emphasis on weight loss as well as increasing exercise and decreasing simple carbohydrates in her diet. We also reviewed medication options with an emphasis on risk versus benefit of those discussed.   Repetitive spaced learning was employed today to elicit superior memory formation and behavioral change.   4. Obesity with current BMI 39.2 Miranda Hunter is currently in the action stage of  change. As such, her goal is to continue with weight loss efforts. She has agreed to the Category 3 Plan.   We will check IC at next office visit or patient is aware to arrive 30 minutes prior to appointment.  Exercise goals:  As is.  Behavioral modification strategies: increasing lean protein intake, decreasing simple carbohydrates, meal planning and cooking strategies, keeping healthy foods in the home, and planning for success.  Miranda Hunter has agreed to follow-up with our clinic in 5 weeks fasting. She was informed of the importance of frequent follow-up visits to maximize her success with intensive lifestyle modifications for her multiple health conditions.   Objective:   Blood pressure 118/75, pulse 95, temperature 98 F (36.7 C), height 5\' 9"  (1.753 m), weight 265 lb (120.2 kg), SpO2 96 %. Body mass index is 39.13 kg/m.  General: Cooperative, alert, well developed, in no acute distress. HEENT: Conjunctivae and lids unremarkable. Cardiovascular: Regular rhythm.  Lungs: Normal work of breathing. Neurologic: No focal deficits.   Lab Results  Component Value Date   CREATININE 0.78 08/24/2021   BUN 8 08/24/2021   NA 137 08/24/2021   K 4.7 08/24/2021   CL 102 08/24/2021   CO2 17 (L) 08/24/2021   Lab Results  Component Value Date   ALT 19 08/24/2021   AST 16 08/24/2021   ALKPHOS 139 (H) 08/24/2021   BILITOT 0.3 08/24/2021   Lab Results  Component Value Date   HGBA1C 6.0 (H) 08/24/2021   HGBA1C 5.8 (H) 02/08/2021   HGBA1C 5.6 10/12/2020   HGBA1C 5.7 (H) 05/04/2020   HGBA1C 5.4 10/26/2019   Lab Results  Component Value Date   INSULIN 33.6 (H) 08/24/2021   INSULIN 38.2 (H) 02/08/2021   INSULIN 39.4 (H) 10/12/2020   INSULIN 31.0 (H) 05/04/2020   INSULIN 36.1 (H) 10/26/2019   Lab Results  Component Value Date   TSH 1.260 05/04/2020   Lab Results  Component Value Date   CHOL 196 08/24/2021   HDL 39 (L) 08/24/2021   LDLCALC 129 (H) 08/24/2021   TRIG 157 (H)  08/24/2021   CHOLHDL 5.0 (H) 08/24/2021   Lab Results  Component Value Date   VD25OH 45.3 08/24/2021   VD25OH 48.0 02/08/2021   VD25OH 52.2 10/12/2020   Lab Results  Component Value Date   WBC 9.8 12/03/2018   HGB 13.8 12/03/2018   HCT 42.9 12/03/2018   MCV 90 12/03/2018   PLT 445.0 (H) 09/03/2017   No results found for: IRON, TIBC, FERRITIN  Attestation Statements:   Reviewed by clinician on day of visit: allergies, medications, problem list, medical history, surgical history, family history, social history, and previous encounter notes.  I, 11/03/2017, RMA, am acting as [place] Latino for Energy manager, NP.   I have reviewed the above documentation for accuracy and completeness, and I agree with the above. -  Yittel Emrich d. Natarsha Hurwitz, NP-C

## 2021-10-31 ENCOUNTER — Telehealth (INDEPENDENT_AMBULATORY_CARE_PROVIDER_SITE_OTHER): Payer: Self-pay | Admitting: Adult Health

## 2021-10-31 ENCOUNTER — Encounter (INDEPENDENT_AMBULATORY_CARE_PROVIDER_SITE_OTHER): Payer: Self-pay

## 2021-10-31 NOTE — Telephone Encounter (Signed)
Prior authorization denied for Ozempic. Patient sent mychart message.  °

## 2021-11-08 ENCOUNTER — Telehealth (HOSPITAL_COMMUNITY): Payer: Self-pay

## 2021-11-08 MED ORDER — AMPHETAMINE-DEXTROAMPHETAMINE 10 MG PO TABS: 10.0000 mg | ORAL_TABLET | Freq: Every day | ORAL | 0 refills | Status: DC

## 2021-11-08 NOTE — Telephone Encounter (Signed)
Patient needs a refill on [facility]derall sent to [facility] [address] in [place]

## 2021-11-10 ENCOUNTER — Telehealth (INDEPENDENT_AMBULATORY_CARE_PROVIDER_SITE_OTHER): Payer: 59 | Admitting: Psychiatry

## 2021-11-10 ENCOUNTER — Encounter (HOSPITAL_COMMUNITY): Payer: Self-pay | Admitting: Psychiatry

## 2021-11-10 DIAGNOSIS — F900 Attention-deficit hyperactivity disorder, predominantly inattentive type: Secondary | ICD-10-CM

## 2021-11-10 DIAGNOSIS — F411 Generalized anxiety disorder: Secondary | ICD-10-CM | POA: Diagnosis not present

## 2021-11-10 DIAGNOSIS — F5102 Adjustment insomnia: Secondary | ICD-10-CM | POA: Diagnosis not present

## 2021-11-10 MED ORDER — AMPHETAMINE-DEXTROAMPHETAMINE 5 MG PO TABS: 5.0000 mg | ORAL_TABLET | Freq: Every day | ORAL | 0 refills | Status: DC

## 2021-11-10 NOTE — Progress Notes (Signed)
[facility] Follow up visit  Patient Identification: Miranda Hunter MRN:  [id number] Date of Evaluation:  11/10/2021  Chief complaint: follow up adhd Referral Source: primary care Visit Diagnosis:    ICD-10-CM   1. Attention deficit hyperactivity disorder (ADHD), predominantly inattentive type  F90.0     2. GAD (generalized anxiety disorder)  F41.1     3. Adjustment insomnia  F51.02      Virtual Visit via Video Note  I connected with [name] on 11/10/21 at 10:00 AM EST by a video enabled telemedicine application and verified that I am speaking with the correct person using two identifiers.  Location: Patient: home Provider: home office   I discussed the limitations of evaluation and management by telemedicine and the availability of in person appointments. The patient expressed understanding and agreed to proceed.      I discussed the assessment and treatment plan with the patient. The patient was provided an opportunity to ask questions and all were answered. The patient agreed with the plan and demonstrated an understanding of the instructions.   The patient was advised to call back or seek an in-person evaluation if the symptoms worsen or if the condition fails to improve as anticipated.  I provided 18 minutes of non-face-to-face time during this encounter.       History of Present Illness: 35  years old single White female referred by PCP for management of inattention. Patient has been a part of weight maintenance program   Doing fair mood wise, anxiety manageable Has had multiple marriages to attend this year, kept busy Still gets distracted at work and feels need an extra or some dose of adderall during afternoon time Stress level manageable, takes drug holidays  Aggravating factors; history of ADHD.  Grandparent death Modifying factors; dog her job, famiy   Duration ADHD since young age; feeling dose is low          Past Psychiatric History:  anxiety, inattention  Previous Psychotropic Medications: Yes   Substance Abuse History in the last 12 months:  No.  Consequences of Substance Abuse: NA  Past Medical History:  Past Medical History:  Diagnosis Date   Anxiety    Asthma    History of chicken pox    IBS (irritable bowel syndrome)    Migraines     Past Surgical History:  Procedure Laterality Date   tonsillectomy and adnoidectomy Bilateral 2007    Family Psychiatric History: denies  Family History:  Family History  Problem Relation Age of Onset   Migraines Mother    Prostate cancer Father    Hypertension Father    Asthma Sister    Lung cancer Maternal Grandmother        heavy smoker   Migraines Maternal Grandmother    Lung cancer Maternal Grandfather        heavy smoker   Asthma Sister     Social History:   Social History   Socioeconomic History   Marital status: Single    Spouse name: Not on file   Number of children: 0   Years of education: Not on file   Highest education level: Bachelor's degree (e.g., BA, AB, BS)  Occupational History   Not on file  Tobacco Use   Smoking status: Never   Smokeless tobacco: Never  Vaping Use   Vaping Use: Never used  Substance and Sexual Activity   Alcohol use: Yes    Comment: socially   Drug use: No   Sexual activity: Not  Currently    Comment: kyleena  Other Topics Concern   Not on file  Social History Narrative   She is a Psychiatric nurseSam"   Recently got a new puppy- a Advertising account planner named [name]   Caffeine once a week      Social Determinants of Corporate investment banker Strain: Not on file  Food Insecurity: Not on file  Transportation Needs: Not on file  Physical Activity: Not on file  Stress: Not on file  Social Connections: Not on file     Allergies:   Allergies  Allergen Reactions   Amoxicillin-Pot Clavulanate Diarrhea    Metabolic Disorder Labs: Lab Results  Component Value Date   HGBA1C 6.0 (H) 08/24/2021   No  results found for: PROLACTIN Lab Results  Component Value Date   CHOL 196 08/24/2021   TRIG 157 (H) 08/24/2021   HDL 39 (L) 08/24/2021   CHOLHDL 5.0 (H) 08/24/2021   LDLCALC 129 (H) 08/24/2021   LDLCALC 104 (H) 02/08/2021   Lab Results  Component Value Date   TSH 1.260 05/04/2020    Therapeutic Level Labs: No results found for: LITHIUM No results found for: CBMZ No results found for: VALPROATE  Current Medications: Current Outpatient Medications  Medication Sig Dispense Refill   amphetamine-dextroamphetamine (ADDERALL) 5 MG tablet Take 1 tablet (5 mg total) by mouth daily. This is in addition to 10mg  . Take 10mg  in the am and this 5mg  at noon 30 tablet 0   albuterol (VENTOLIN HFA) 108 (90 Base) MCG/ACT inhaler Inhale 2 puffs into the lungs every 6 (six) hours as needed for wheezing or shortness of breath. 18 g 2   amphetamine-dextroamphetamine (ADDERALL) 10 MG tablet Take 1 tablet (10 mg total) by mouth daily. Thirty days bridge supply given by Covering MD. 30 tablet 0   augmented betamethasone dipropionate (DIPROLENE-AF) 0.05 % ointment Apply topically.     cetirizine (ZYRTEC) 10 MG tablet Take 10 mg by mouth daily.     Clindamycin-Benzoyl Per, Refr, gel Apply 1 application topically daily.     escitalopram (LEXAPRO) 20 MG tablet TAKE 1 TABLET BY MOUTH EVERY DAY 90 tablet 1   Flaxseed, Linseed, (FLAX SEEDS PO) Take 1 tablet by mouth at bedtime.     fluticasone (FLONASE) 50 MCG/ACT nasal spray      medroxyPROGESTERone (DEPO-PROVERA) 150 MG/ML injection      metFORMIN (GLUCOPHAGE) 1000 MG tablet Take 1 tablet (1,000 mg total) by mouth 2 (two) times daily with a meal. 60 tablet 0   Multiple Vitamin (MULTIVITAMIN) tablet Take 1 tablet by mouth daily.     mupirocin ointment (BACTROBAN) 2 % SMARTSIG:1 Application Topical 2-3 Times Daily     ondansetron (ZOFRAN-ODT) 4 MG disintegrating tablet TAKE 1-2 TABLETS BY MOUTH EVERY 8 HOURS AS NEEDED FOR NAUSEA. 30 tablet 1   promethazine  (PHENERGAN) 25 MG tablet Take 1 tablet (25 mg total) by mouth every 6 (six) hours as needed for nausea or vomiting. 30 tablet 6   rizatriptan (MAXALT-MLT) 10 MG disintegrating tablet Take 1 tablet (10 mg total) by mouth as needed for migraine. May repeat in 2 hours if needed 9 tablet 11   Semaglutide,0.25 or 0.5MG /DOS, (OZEMPIC, 0.25 OR 0.5 MG/DOSE,) 2 MG/1.5ML SOPN Inject 0.5 mg into the skin once a week. 3 mL 0   traZODone (DESYREL) 50 MG tablet TAKE 1 TABLET BY MOUTH EVERYDAY AT BEDTIME 90 tablet 0   No current facility-administered medications for this visit.  Psychiatric Specialty Exam: Review of Systems  Cardiovascular:  Negative for chest pain.  Neurological:  Negative for headaches.  Psychiatric/Behavioral:  Positive for decreased concentration. Negative for agitation.    There were no vitals taken for this visit.There is no height or weight on file to calculate BMI.  General Appearance: Casual  Eye Contact:  Fair  Speech:  Slow  Volume:  Normal  Mood:fair  Affect:  Congruent  Thought Process:  Goal Directed  Orientation:  Full (Time, Place, and Person)  Thought Content:  Logical  Suicidal Thoughts:  No  Homicidal Thoughts:  No  Memory:  Immediate;   Fair Recent;   Fair  Judgement:  Fair  Insight:  Fair  Psychomotor Activity:  Normal  Concentration:  Concentration: Fair and Attention Span: variable  Recall:  Good  Fund of Knowledge:Good  Language: Good  Akathisia:  No  Handed:   AIMS (if indicated):  not done  Assets:  Desire for Improvement Housing  ADL's:  Intact  Cognition: WNL  Sleep:  Fair   Screenings: PHQ2-9    Flowsheet Row Office Visit from 12/03/2018 in [facility] Office Visit from 09/03/2017 in [facility]  PHQ-2 Total Score 5 0  PHQ-9 Total Score 13 --      Flowsheet Row Video Visit from 11/10/2021 in [facility] [place] Video Visit from 07/26/2021 in [facility] AT [place] Video Visit from 03/15/2021 in [facility] AT [place]  C-SSRS RISK CATEGORY No Risk No Risk No Risk       Assessment and Plan: as follows Prior documentation reviewed    ADHD;gets distracted during later part of the day. Will add 5mg  noon , continue adderall 10mg  during morning. Stil take drug holidays  Generalized anxiety disorder;doing fair, continue lexapro, gets from pcp Insomnia: did not endorse any worsening, on trazadone, can continue Fu 38m   , MD 12/16/202210:07 AM

## 2021-12-03 ENCOUNTER — Encounter (INDEPENDENT_AMBULATORY_CARE_PROVIDER_SITE_OTHER): Payer: Self-pay | Admitting: Adult Health

## 2021-12-04 ENCOUNTER — Encounter (INDEPENDENT_AMBULATORY_CARE_PROVIDER_SITE_OTHER): Payer: Self-pay | Admitting: Adult Health

## 2021-12-04 ENCOUNTER — Telehealth (HOSPITAL_COMMUNITY): Payer: Self-pay

## 2021-12-04 ENCOUNTER — Telehealth (INDEPENDENT_AMBULATORY_CARE_PROVIDER_SITE_OTHER): Payer: 59 | Admitting: Adult Health

## 2021-12-04 ENCOUNTER — Other Ambulatory Visit: Payer: Self-pay

## 2021-12-04 ENCOUNTER — Encounter (INDEPENDENT_AMBULATORY_CARE_PROVIDER_SITE_OTHER): Payer: Self-pay

## 2021-12-04 DIAGNOSIS — E669 Obesity, unspecified: Secondary | ICD-10-CM

## 2021-12-04 DIAGNOSIS — Z6839 Body mass index (BMI) 39.0-39.9, adult: Secondary | ICD-10-CM | POA: Diagnosis not present

## 2021-12-04 DIAGNOSIS — G4709 Other insomnia: Secondary | ICD-10-CM | POA: Diagnosis not present

## 2021-12-04 DIAGNOSIS — R509 Fever, unspecified: Secondary | ICD-10-CM | POA: Insufficient documentation

## 2021-12-04 NOTE — Telephone Encounter (Signed)
Medication refill requests - Message left for pt that her message was received for refills of her two dosages of Adderall, 10 mg and 5 mg [facility] [address] [facility] [place]. Agreed to send refill requests to Dr. De Nurse for patient.

## 2021-12-04 NOTE — Progress Notes (Signed)
TeleHealth Visit:  Due to the COVID-19 pandemic, this visit was completed with telemedicine (audio/video) technology to reduce patient and provider exposure as well as to preserve personal protective equipment.   Miranda Hunter has verbally consented to this TeleHealth visit. The patient is located at home, the provider is located at the [facility] office. The participants in this visit include the listed provider and patient. The visit was conducted today via MyChart.  Chief Complaint: OBESITY Miranda Hunter is here to discuss her progress with her obesity treatment plan along with follow-up of her obesity related diagnoses. Miranda Hunter is on the Category 3 Plan and states she is following her eating plan approximately 80% of the time. Miranda Hunter states she is walking 2 times per day for 60 minutes 7 times per week.  Today's visit was #: 45 Starting weight: 257 lbs Starting date: 12/03/2018  Interim History:  Miranda Hunter traveled to [place], [place] for 1 week to her cousin's wedding.   COVID/flu/norvirus - all reported at wedding. She flew home 12/01/21 Sx's started 12/03/21-low grade fever (oral 100.6 F), fatigue. All immunizations UTD.  She denies N/V/D/respiratory distress.  She has tested twice with home antigen COVID-19 tests - both negative results.  Subjective:   1. Fever, low grade Miranda Hunter traveled to [place], [place] for 1 week to her cousin's wedding.   COVID/flu/norvirus - all reported at wedding. She flew home 12/01/21 Sx's started 12/03/21-low grade fever (oral 100.6 F), fatigue. All immunizations UTD.  2. Other insomnia She reports quality sleep with nightly trazodone 50 mg QHS. Denies am sedation.  Assessment/Plan:   1. Fever, low grade Remain home with acute symptoms. Increase fluids, rest, OTC acetaminophen as needed. If sx's worsen- f/u with PCP or UC.  2. Other insomnia Continue trazodone 50 mg as directed.  3. Obesity with current BMI 39.2  Miranda Hunter  is currently in the action stage of change. As such, her goal is to continue with weight loss efforts. She has agreed to the Category 3 Plan.   IC/fasting labs at next office visit.  Exercise goals: No exercise has been prescribed at this time.  Behavioral modification strategies: increasing lean protein intake, decreasing simple carbohydrates, meal planning and cooking strategies, keeping healthy foods in the home, and planning for success.  Miranda Hunter has agreed to follow-up with our clinic in 3 weeks. She was informed of the importance of frequent follow-up visits to maximize her success with intensive lifestyle modifications for her multiple health conditions.  Objective:   VITALS: Per patient if applicable, see vitals. GENERAL: Alert and in no acute distress. CARDIOPULMONARY: No increased WOB. Speaking in clear sentences.  PSYCH: Pleasant and cooperative. Speech normal rate and rhythm. Affect is appropriate. Insight and judgement are appropriate. Attention is focused, linear, and appropriate.  NEURO: Oriented as arrived to appointment on time with no prompting.   Lab Results  Component Value Date   CREATININE 0.78 08/24/2021   BUN 8 08/24/2021   NA 137 08/24/2021   K 4.7 08/24/2021   CL 102 08/24/2021   CO2 17 (L) 08/24/2021   Lab Results  Component Value Date   ALT 19 08/24/2021   AST 16 08/24/2021   ALKPHOS 139 (H) 08/24/2021   BILITOT 0.3 08/24/2021   Lab Results  Component Value Date   HGBA1C 6.0 (H) 08/24/2021   HGBA1C 5.8 (H) 02/08/2021   HGBA1C 5.6 10/12/2020   HGBA1C 5.7 (H) 05/04/2020   HGBA1C 5.4 10/26/2019   Lab Results  Component Value  Date   INSULIN 33.6 (H) 08/24/2021   INSULIN 38.2 (H) 02/08/2021   INSULIN 39.4 (H) 10/12/2020   INSULIN 31.0 (H) 05/04/2020   INSULIN 36.1 (H) 10/26/2019   Lab Results  Component Value Date   TSH 1.260 05/04/2020   Lab Results  Component Value Date   CHOL 196 08/24/2021   HDL 39 (L) 08/24/2021   LDLCALC 129 (H)  08/24/2021   TRIG 157 (H) 08/24/2021   CHOLHDL 5.0 (H) 08/24/2021   Lab Results  Component Value Date   VD25OH 45.3 08/24/2021   VD25OH 48.0 02/08/2021   VD25OH 52.2 10/12/2020   Lab Results  Component Value Date   WBC 9.8 12/03/2018   HGB 13.8 12/03/2018   HCT 42.9 12/03/2018   MCV 90 12/03/2018   PLT 445.0 (H) 09/03/2017   Attestation Statements:   Reviewed by clinician on day of visit: allergies, medications, problem list, medical history, surgical history, family history, social history, and previous encounter notes.  Time spent on visit including pre-visit chart review and post-visit charting and care was 25 minutes.   I, Insurance claims handler, CMA, am acting as Energy manager for William Hamburger, NP.  I have reviewed the above documentation for accuracy and completeness, and I agree with the above. - Leandria Thier d. Kameran Lallier, NP-C

## 2021-12-05 MED ORDER — AMPHETAMINE-DEXTROAMPHETAMINE 5 MG PO TABS: 5.0000 mg | ORAL_TABLET | Freq: Every day | ORAL | 0 refills | Status: DC

## 2021-12-05 MED ORDER — AMPHETAMINE-DEXTROAMPHETAMINE 10 MG PO TABS: 10.0000 mg | ORAL_TABLET | Freq: Every day | ORAL | 0 refills | Status: DC

## 2021-12-05 NOTE — Telephone Encounter (Signed)
Medication management - message left for patient that Dr. Gilmore Laroche had sent in her requested [facility] [address] in [place] and requested pt call back if any issues with obtaining refills

## 2021-12-19 ENCOUNTER — Encounter: Payer: Self-pay | Admitting: Physician Assistant

## 2021-12-19 ENCOUNTER — Telehealth (INDEPENDENT_AMBULATORY_CARE_PROVIDER_SITE_OTHER): Payer: 59 | Admitting: Physician Assistant

## 2021-12-19 VITALS — Temp 101.0°F | Ht 69.0 in | Wt 265.0 lb

## 2021-12-19 DIAGNOSIS — U071 COVID-19: Secondary | ICD-10-CM

## 2021-12-19 DIAGNOSIS — J4540 Moderate persistent asthma, uncomplicated: Secondary | ICD-10-CM

## 2021-12-19 MED ORDER — HYDROCOD POLI-CHLORPHE POLI ER 10-8 MG/5ML PO SUER: 5.0000 mL | Freq: Two times a day (BID) | ORAL | 0 refills | Status: DC | PRN

## 2021-12-19 MED ORDER — MOLNUPIRAVIR EUA 200MG CAPSULE: 4.0000 | ORAL_CAPSULE | Freq: Two times a day (BID) | ORAL | 0 refills | Status: AC

## 2021-12-19 NOTE — Progress Notes (Addendum)
Virtual Visit via Video   I connected with Miranda Hunter on 12/19/21 at  2:00 PM EST by a video enabled telemedicine application and verified that I am speaking with the correct person using two identifiers. Location patient: Home Location provider: [facility], Office Persons participating in the virtual visit: [place], Gavin PA-C, Corky Mull, LPN   I discussed the limitations of evaluation and management by telemedicine and the availability of in person appointments. The patient expressed understanding and agreed to proceed.  I acted as a Neurosurgeon for [employer], PA-C [employer], LPN   Subjective:   HPI:   Patient is requesting evaluation for possible COVID-19.  Symptom onset: Started late Sunday night  Travel/contacts: no exposure that she is aware of   Vaccination status: J&J one  Testing results: Home COVID test positive today  This is her 4th time having COVID!  Patient endorses the following symptoms: Chest congestion, nasal congestion, fever 100 to 102, took now after nap 101, headache, body aches, ears hurting.  Patient denies the following symptoms: Diarrhea, SOB, chest pain, inability to keep po's down  Treatments tried: Ibuprofen, cough syrup she had left over., Albuterol inhaler  Patient risk factors: Current COVID-19 risk of complications score: 2 Smoking status: Miranda Hunter  reports that she has never smoked. She has never used smokeless tobacco. If female, currently pregnant? []   Yes [x]   No  ROS: See pertinent positives and negatives per HPI.  Patient Active Problem List   Diagnosis Date Noted   Fever, low grade 12/04/2021   Vitamin D deficiency 11/09/2020   Attention deficit hyperactivity disorder (ADHD) 10/14/2020   Prediabetes 06/06/2020   Transaminitis 06/06/2020   Hyperlipidemia, mixed 06/06/2020   Other insomnia 06/06/2020   Cervical intraepithelial neoplasia grade 1 04/27/2020   Insulin resistance  10/01/2019   Depression 04/28/2019   Class 2 obesity due to excess calories with body mass index (BMI) of 37.0 to 37.9 in adult 04/28/2019   Migraine with aura and without status migrainosus, not intractable 10/14/2018   Obesity (BMI 30-39.9) 10/14/2018   Moderate persistent asthma 09/16/2018    Social History   Tobacco Use   Smoking status: Never   Smokeless tobacco: Never  Substance Use Topics   Alcohol use: Yes    Comment: socially    Current Outpatient Medications:    albuterol (VENTOLIN HFA) 108 (90 Base) MCG/ACT inhaler, Inhale 2 puffs into the lungs every 6 (six) hours as needed for wheezing or shortness of breath., Disp: 18 g, Rfl: 2   amphetamine-dextroamphetamine (ADDERALL) 10 MG tablet, Take 1 tablet (10 mg total) by mouth daily., Disp: 30 tablet, Rfl: 0   amphetamine-dextroamphetamine (ADDERALL) 5 MG tablet, Take 1 tablet (5 mg total) by mouth daily. This is in addition to 10mg  . Take 10mg  in the am and this 5mg  at noon, Disp: 30 tablet, Rfl: 0   augmented betamethasone dipropionate (DIPROLENE-AF) 0.05 % ointment, Apply topically., Disp: , Rfl:    cetirizine (ZYRTEC) 10 MG tablet, Take 10 mg by mouth daily., Disp: , Rfl:    chlorpheniramine-HYDROcodone (TUSSIONEX PENNKINETIC ER) 10-8 MG/5ML, Take 5 mLs by mouth every 12 (twelve) hours as needed for cough., Disp: 115 mL, Rfl: 0   Clindamycin-Benzoyl Per, Refr, gel, Apply 1 application topically daily., Disp: , Rfl:    escitalopram (LEXAPRO) 20 MG tablet, TAKE 1 TABLET BY MOUTH EVERY DAY, Disp: 90 tablet, Rfl: 1   Flaxseed, Linseed, (FLAX SEEDS PO), Take 1 tablet by mouth at bedtime.,  Disp: , Rfl:    fluticasone (FLONASE) 50 MCG/ACT nasal spray, , Disp: , Rfl:    medroxyPROGESTERone (DEPO-PROVERA) 150 MG/ML injection, , Disp: , Rfl:    metFORMIN (GLUCOPHAGE) 1000 MG tablet, Take 1 tablet (1,000 mg total) by mouth 2 (two) times daily with a meal., Disp: 60 tablet, Rfl: 0   molnupiravir EUA (LAGEVRIO) 200 mg CAPS capsule, Take 4  capsules (800 mg total) by mouth 2 (two) times daily for 5 days., Disp: 40 capsule, Rfl: 0   Multiple Vitamin (MULTIVITAMIN) tablet, Take 1 tablet by mouth daily., Disp: , Rfl:    mupirocin ointment (BACTROBAN) 2 %, SMARTSIG:1 Application Topical 2-3 Times Daily, Disp: , Rfl:    ondansetron (ZOFRAN-ODT) 4 MG disintegrating tablet, TAKE 1-2 TABLETS BY MOUTH EVERY 8 HOURS AS NEEDED FOR NAUSEA., Disp: 30 tablet, Rfl: 1   rizatriptan (MAXALT-MLT) 10 MG disintegrating tablet, Take 1 tablet (10 mg total) by mouth as needed for migraine. May repeat in 2 hours if needed, Disp: 9 tablet, Rfl: 11   traZODone (DESYREL) 50 MG tablet, TAKE 1 TABLET BY MOUTH EVERYDAY AT BEDTIME, Disp: 90 tablet, Rfl: 0  Allergies  Allergen Reactions   Amoxicillin-Pot Clavulanate Diarrhea    Objective:   VITALS: Per patient if applicable, see vitals. GENERAL: Alert, appears well and in no acute distress. HEENT: Atraumatic, conjunctiva clear, no obvious abnormalities on inspection of external nose and ears. NECK: Normal movements of the head and neck. CARDIOPULMONARY: No increased WOB. Speaking in clear sentences. I:E ratio WNL.  MS: Moves all visible extremities without noticeable abnormality. PSYCH: Pleasant and cooperative, well-groomed. Speech normal rate and rhythm. Affect is appropriate. Insight and judgement are appropriate. Attention is focused, linear, and appropriate.  NEURO: CN grossly intact. Oriented as arrived to appointment on time with no prompting. Moves both UE equally.  SKIN: No obvious lesions, wounds, erythema, or cyanosis noted on face or hands.  Assessment and Plan:   Miranda Hunter was seen today for covid positive.  Diagnoses and all orders for this visit:  COVID-19 No red flags on discussion, patient is not in any obvious distress during our visit.  She is interested in an antiviral for COVID and would like to trial molnupiravir. We briefly discussed paxlovid however she is also requesting a  prescription for Tussionex and there is an interaction with Tussionex and paxlovid, so we opted for molnupiravir.  Denies concerns for pregnancy and is currently receiving the Depo-Provera injection for birth control.  Discussed over the counter supportive care options, including Tylenol 500 mg q 8 hours, with recommendations to push fluids and rest. Reviewed return precautions including new/worsening fever, SOB, new/worsening cough, sudden onset changes of symptoms. Recommended need to self-quarantine and practice social distancing until symptoms resolve. I recommend that patient follow-up if symptoms worsen or persist despite treatment x 7-10 days, sooner if needed.  Moderate persistent asthma, unspecified whether complicated She does have a history of moderate persistent asthma, will send in prednisone for her to have on hand should her breathing worsen.  She has prescription inhaler for Ventolin.  Other orders -     molnupiravir EUA (LAGEVRIO) 200 mg CAPS capsule; Take 4 capsules (800 mg total) by mouth 2 (two) times daily for 5 days. -     chlorpheniramine-HYDROcodone (TUSSIONEX PENNKINETIC ER) 10-8 MG/5ML; Take 5 mLs by mouth every 12 (twelve) hours as needed for cough.      I discussed the assessment and treatment plan with the patient. The patient was provided an opportunity  to ask questions and all were answered. The patient agreed with the plan and demonstrated an understanding of the instructions.   The patient was advised to call back or seek an in-person evaluation if the symptoms worsen or if the condition fails to improve as anticipated.   CMA or LPN served as scribe during this visit. History, Physical, and Plan performed by medical provider. The above documentation has been reviewed and is accurate and complete.   SecorSamantha Shalice Woodring, [place] 12/19/2021

## 2021-12-19 NOTE — Telephone Encounter (Signed)
Spoke to pt told her Joya can see her today for virtual visit at 2:00 PM. Pt said that would be fine. Told her okay, talk to you later.

## 2022-01-02 ENCOUNTER — Telehealth (HOSPITAL_COMMUNITY): Payer: Self-pay

## 2022-01-02 ENCOUNTER — Other Ambulatory Visit: Payer: Self-pay | Admitting: Physician Assistant

## 2022-01-02 MED ORDER — AMPHETAMINE-DEXTROAMPHETAMINE 5 MG PO TABS: 5.0000 mg | ORAL_TABLET | Freq: Every day | ORAL | 0 refills | Status: DC

## 2022-01-02 MED ORDER — AMPHETAMINE-DEXTROAMPHETAMINE 10 MG PO TABS: 10.0000 mg | ORAL_TABLET | Freq: Every day | ORAL | 0 refills | Status: DC

## 2022-01-02 NOTE — Telephone Encounter (Signed)
Patient called requesting refills on Adderall 5mg  and 10mg . [facility] [address] [facility] 

## 2022-01-29 ENCOUNTER — Telehealth (HOSPITAL_COMMUNITY): Payer: Self-pay

## 2022-01-29 ENCOUNTER — Encounter: Payer: Self-pay | Admitting: Physician Assistant

## 2022-01-29 MED ORDER — AMPHETAMINE-DEXTROAMPHETAMINE 5 MG PO TABS: 5.0000 mg | ORAL_TABLET | Freq: Every day | ORAL | 0 refills | Status: DC

## 2022-01-29 MED ORDER — AMPHETAMINE-DEXTROAMPHETAMINE 10 MG PO TABS: 10.0000 mg | ORAL_TABLET | Freq: Every day | ORAL | 0 refills | Status: DC

## 2022-01-29 NOTE — Telephone Encounter (Signed)
Medication refills - Message left for patient on her identifiable voicemail that Dr. Gilmore Laroche had sent in her requested refills for Adderall 5 mg and 10 mg [facility] as requested. Patient to call back if any questions.  ?

## 2022-01-29 NOTE — Telephone Encounter (Signed)
Medication refill request - Telephone call with pt to inform her message was received requesting refills of Adderall 5 mg and 10 mg. Informed this request would be sent to Dr. De Nurse, last ordered 01/02/22 and pt. returns next on 02/16/22. [facility] [address], [place].  ?

## 2022-01-29 NOTE — Telephone Encounter (Signed)
Dr. Jimmey Ralph, pt is requesting refill for Trazodone 50 mg at hs. Pt was seeing [facility] but is not longer and would like Bernetta to prescribe her Trazodone. Please advise if okay to refill? ?

## 2022-01-29 NOTE — Telephone Encounter (Signed)
Yes it is ok to refill. ? ?Miranda Hunter. Jimmey Ralph, MD ?01/29/2022 12:39 PM  ? ?

## 2022-01-30 ENCOUNTER — Other Ambulatory Visit: Payer: Self-pay | Admitting: *Deleted

## 2022-01-30 DIAGNOSIS — G4709 Other insomnia: Secondary | ICD-10-CM

## 2022-01-30 MED ORDER — TRAZODONE HCL 50 MG PO TABS: ORAL_TABLET | ORAL | 0 refills | Status: DC

## 2022-01-30 NOTE — Telephone Encounter (Signed)
Rx was send to pharmacy  

## 2022-02-16 ENCOUNTER — Telehealth (INDEPENDENT_AMBULATORY_CARE_PROVIDER_SITE_OTHER): Payer: 59 | Admitting: Psychiatry

## 2022-02-16 ENCOUNTER — Encounter (HOSPITAL_COMMUNITY): Payer: Self-pay | Admitting: Psychiatry

## 2022-02-16 DIAGNOSIS — F5102 Adjustment insomnia: Secondary | ICD-10-CM | POA: Diagnosis not present

## 2022-02-16 DIAGNOSIS — F900 Attention-deficit hyperactivity disorder, predominantly inattentive type: Secondary | ICD-10-CM

## 2022-02-16 DIAGNOSIS — F411 Generalized anxiety disorder: Secondary | ICD-10-CM

## 2022-02-16 NOTE — Progress Notes (Signed)
[facility] Follow up visit ? ?Patient Identification: Miranda Hunter ?MRN:  [id number] ?Date of Evaluation:  02/16/2022 ? ?Chief complaint: follow up adhd ?Referral Source: primary care ?Visit Diagnosis:  ?  ICD-10-CM   ?1. Attention deficit hyperactivity disorder (ADHD), predominantly inattentive type  F90.0   ?  ?2. GAD (generalized anxiety disorder)  F41.1   ?  ?3. Adjustment insomnia  F51.02   ?  ? ?Virtual Visit via Video Note ? ?I connected with Miranda Hunter on 02/16/22 at 10:30 AM EDT by a video enabled telemedicine application and verified that I am speaking with the correct person using two identifiers. ? ?Location: ?Patient: home ?Provider: home office ?  ?I discussed the limitations of evaluation and management by telemedicine and the availability of in person appointments. The patient expressed understanding and agreed to proceed. ? ? ? ?  ?I discussed the assessment and treatment plan with the patient. The patient was provided an opportunity to ask questions and all were answered. The patient agreed with the plan and demonstrated an understanding of the instructions. ?  ?The patient was advised to call back or seek an in-person evaluation if the symptoms worsen or if the condition fails to improve as anticipated. ? ?I provided 15 minutes of non-face-to-face time during this encounter. ? ? ? ?  ? ? ? ?History of Present Illness: 36  years old single White female referred by PCP for management of inattention. ?Patient has been a part of weight maintenance program  ? ?Doing fair, attention improved by adding 5mg  extra adderall . Work is less stressed ?Anxiety manageable  ?Aggravating factors; history of ADHD.  Grandparent death ?Modifying factors; dog her job, family ? ? ?Duration ADHD since young age;  ?Severity improved ? ? ? ? ? ? ? ?Past Psychiatric History: anxiety, inattention ? ?Previous Psychotropic Medications: Yes  ? ?Substance Abuse History in the last 12 months:  No. ? ?Consequences of Substance  Abuse: ?NA ? ?Past Medical History:  ?Past Medical History:  ?Diagnosis Date  ? Anxiety   ? Asthma   ? History of chicken pox   ? IBS (irritable bowel syndrome)   ? Migraines   ?  ?Past Surgical History:  ?Procedure Laterality Date  ? tonsillectomy and adnoidectomy Bilateral 2007  ? ? ?Family Psychiatric History: denies ? ?Family History:  ?Family History  ?Problem Relation Age of Onset  ? Migraines Mother   ? Prostate cancer Father   ? Hypertension Father   ? Asthma Sister   ? Lung cancer Maternal Grandmother   ?     heavy smoker  ? Migraines Maternal Grandmother   ? Lung cancer Maternal Grandfather   ?     heavy smoker  ? Asthma Sister   ? ? ?Social History:   ?Social History  ? ?Socioeconomic History  ? Marital status: Single  ?  Spouse name: Not on file  ? Number of children: 0  ? Years of education: Not on file  ? Highest education level: Bachelor's degree (e.g., BA, AB, BS)  ?Occupational History  ? Not on file  ?Tobacco Use  ? Smoking status: Never  ? Smokeless tobacco: Never  ?Vaping Use  ? Vaping Use: Never used  ?Substance and Sexual Activity  ? Alcohol use: Yes  ?  Comment: socially  ? Drug use: No  ? Sexual activity: Not Currently  ?  Comment: kyleena  ?Other Topics Concern  ? Not on file  ?Social History Narrative  ? She is a Human resources officer  ? "  [name]"  ? Recently got a new puppy- a golden retriever named Miranda Hunter  ? Caffeine once a week  ?   ? ?Social Determinants of Health  ? ?Financial Resource Strain: Not on file  ?Food Insecurity: Not on file  ?Transportation Needs: Not on file  ?Physical Activity: Not on file  ?Stress: Not on file  ?Social Connections: Not on file  ? ? ? ?Allergies:   ?Allergies  ?Allergen Reactions  ? Amoxicillin-Pot Clavulanate Diarrhea  ? ? ?Metabolic Disorder Labs: ?Lab Results  ?Component Value Date  ? HGBA1C 6.0 (H) 08/24/2021  ? ?No results found for: PROLACTIN ?Lab Results  ?Component Value Date  ? CHOL 196 08/24/2021  ? TRIG 157 (H) 08/24/2021  ? HDL 39 (L) 08/24/2021  ? CHOLHDL 5.0  (H) 08/24/2021  ? LDLCALC 129 (H) 08/24/2021  ? LDLCALC 104 (H) 02/08/2021  ? ?Lab Results  ?Component Value Date  ? TSH 1.260 05/04/2020  ? ? ?Therapeutic Level Labs: ?No results found for: LITHIUM ?No results found for: CBMZ ?No results found for: VALPROATE ? ?Current Medications: ?Current Outpatient Medications  ?Medication Sig Dispense Refill  ? albuterol (VENTOLIN HFA) 108 (90 Base) MCG/ACT inhaler Inhale 2 puffs into the lungs every 6 (six) hours as needed for wheezing or shortness of breath. 18 g 2  ? amphetamine-dextroamphetamine (ADDERALL) 10 MG tablet Take 1 tablet (10 mg total) by mouth daily. 30 tablet 0  ? amphetamine-dextroamphetamine (ADDERALL) 5 MG tablet Take 1 tablet (5 mg total) by mouth daily. This is in addition to 10mg  . Take 10mg  in the am and this 5mg  at noon 30 tablet 0  ? augmented betamethasone dipropionate (DIPROLENE-AF) 0.05 % ointment Apply topically.    ? cetirizine (ZYRTEC) 10 MG tablet Take 10 mg by mouth daily.    ? chlorpheniramine-HYDROcodone (TUSSIONEX PENNKINETIC ER) 10-8 MG/5ML Take 5 mLs by mouth every 12 (twelve) hours as needed for cough. 115 mL 0  ? Clindamycin-Benzoyl Per, Refr, gel Apply 1 application topically daily.    ? escitalopram (LEXAPRO) 20 MG tablet TAKE 1 TABLET BY MOUTH EVERY DAY 90 tablet 1  ? Flaxseed, Linseed, (FLAX SEEDS PO) Take 1 tablet by mouth at bedtime.    ? fluticasone (FLONASE) 50 MCG/ACT nasal spray     ? medroxyPROGESTERone (DEPO-PROVERA) 150 MG/ML injection     ? metFORMIN (GLUCOPHAGE) 1000 MG tablet Take 1 tablet (1,000 mg total) by mouth 2 (two) times daily with a meal. 60 tablet 0  ? Multiple Vitamin (MULTIVITAMIN) tablet Take 1 tablet by mouth daily.    ? mupirocin ointment (BACTROBAN) 2 % SMARTSIG:1 Application Topical 2-3 Times Daily    ? ondansetron (ZOFRAN-ODT) 4 MG disintegrating tablet TAKE 1-2 TABLETS BY MOUTH EVERY 8 HOURS AS NEEDED FOR NAUSEA. 30 tablet 1  ? rizatriptan (MAXALT-MLT) 10 MG disintegrating tablet Take 1 tablet (10 mg  total) by mouth as needed for migraine. May repeat in 2 hours if needed 9 tablet 11  ? traZODone (DESYREL) 50 MG tablet TAKE 1 TABLET BY MOUTH EVERYDAY AT BEDTIME 90 tablet 0  ? ?No current facility-administered medications for this visit.  ? ? ? ? ?Psychiatric Specialty Exam: ?Review of Systems  ?Cardiovascular:  Negative for chest pain.  ?Neurological:  Negative for weakness.  ?Psychiatric/Behavioral:  Positive for decreased concentration. Negative for agitation.    ?There were no vitals taken for this visit.There is no height or weight on file to calculate BMI.  ?General Appearance: Casual  ?Eye Contact:  Fair  ?Speech:  Slow  ?Volume:  Normal  ?Mood:fair  ?Affect:  Congruent  ?Thought Process:  Goal Directed  ?Orientation:  Full (Time, Place, and Person)  ?Thought Content:  Logical  ?Suicidal Thoughts:  No  ?Homicidal Thoughts:  No  ?Memory:  Immediate;   Fair ?Recent;   Fair  ?Judgement:  Fair  ?Insight:  Fair  ?Psychomotor Activity:  Normal  ?Concentration:  Concentration: Fair and Attention Span: variable  ?Recall:  Good  ?[employer] of [place]  ?Language: Good  ?Akathisia:  No  ?Handed:   ?AIMS (if indicated):  not done  ?Assets:  Desire for Improvement ?Housing  ?ADL's:  Intact  ?Cognition: WNL  ?Sleep:  Fair  ? ?Screenings: ?PHQ2-9   ? ?[facility] Office Visit from 12/03/2018 in [facility] Office Visit from 09/03/2017 in [facility]  ?PHQ-2 Total Score 5 0  ?PHQ-9 Total Score 13 --  ? ?  ? ?Flowsheet Row Video Visit from 02/16/2022 in [facility] Video Visit from 11/10/2021 in [facility] Video Visit from 07/26/2021 in [facility]  ?C-SSRS RISK CATEGORY No Risk No Risk No Risk  ? ?  ? ? ?Assessment and Plan: as follows ? ? ?Prior documentation reviewed ? ? ? ? ?ADHD;improved with adderall 10 plus 5mg  adderall takes drug holidays ? ?Generalized  anxiety disorder;doing fair, continue lexapro from pcp ? ?Insomnia: did not endorse any worsening, on trazadone, can continue ?Fu  5m ? ?Merian Capron, MD ?3/24/202310:32 AM ?

## 2022-02-28 ENCOUNTER — Telehealth: Payer: Self-pay | Admitting: Psychiatry

## 2022-02-28 MED ORDER — AMPHETAMINE-DEXTROAMPHETAMINE 5 MG PO TABS: 5.0000 mg | ORAL_TABLET | Freq: Every day | ORAL | 0 refills | Status: DC

## 2022-02-28 MED ORDER — AMPHETAMINE-DEXTROAMPHETAMINE 10 MG PO TABS: 10.0000 mg | ORAL_TABLET | Freq: Every day | ORAL | 0 refills | Status: DC

## 2022-02-28 NOTE — Telephone Encounter (Signed)
Per pt  ?She needs refill of adderall 5mg  and 10mg  sent to  ?[facility] [place] [facility] (as she is visiting her family).  ?

## 2022-03-02 ENCOUNTER — Other Ambulatory Visit (INDEPENDENT_AMBULATORY_CARE_PROVIDER_SITE_OTHER): Payer: Self-pay | Admitting: Adult Health

## 2022-03-02 DIAGNOSIS — F3289 Other specified depressive episodes: Secondary | ICD-10-CM

## 2022-03-13 ENCOUNTER — Other Ambulatory Visit: Payer: Self-pay

## 2022-03-13 DIAGNOSIS — F3289 Other specified depressive episodes: Secondary | ICD-10-CM

## 2022-03-13 MED ORDER — ESCITALOPRAM OXALATE 20 MG PO TABS: 20.0000 mg | ORAL_TABLET | Freq: Every day | ORAL | 1 refills | Status: DC

## 2022-03-19 LAB — HM PAP SMEAR: HPV, high-risk: NEGATIVE

## 2022-04-02 ENCOUNTER — Telehealth (HOSPITAL_COMMUNITY): Payer: Self-pay

## 2022-04-02 MED ORDER — AMPHETAMINE-DEXTROAMPHETAMINE 5 MG PO TABS: 5.0000 mg | ORAL_TABLET | Freq: Every day | ORAL | 0 refills | Status: DC

## 2022-04-02 MED ORDER — AMPHETAMINE-DEXTROAMPHETAMINE 10 MG PO TABS: 10.0000 mg | ORAL_TABLET | Freq: Every day | ORAL | 0 refills | Status: DC

## 2022-04-02 NOTE — Telephone Encounter (Signed)
Patient needs both [facility] rx's [facility] [address] in [place] ?

## 2022-04-08 ENCOUNTER — Other Ambulatory Visit: Payer: Self-pay | Admitting: Physician Assistant

## 2022-04-24 NOTE — Telephone Encounter (Signed)
Medication refill request - Telephone call with patient to inform her message was received requesting refills of Adderall 5 mg and 10 mg. Informed this request would be sent to Dr. Gilmore Laroche, last ordered 01/02/22 and pt. returns next on 02/16/22.  Provider filled as requested.

## 2022-04-27 ENCOUNTER — Telehealth (HOSPITAL_COMMUNITY): Payer: Self-pay

## 2022-04-27 MED ORDER — AMPHETAMINE-DEXTROAMPHETAMINE 10 MG PO TABS: 10.0000 mg | ORAL_TABLET | Freq: Every day | ORAL | 0 refills | Status: DC

## 2022-04-27 MED ORDER — AMPHETAMINE-DEXTROAMPHETAMINE 5 MG PO TABS: 5.0000 mg | ORAL_TABLET | Freq: Every day | ORAL | 0 refills | Status: DC

## 2022-04-27 NOTE — Telephone Encounter (Signed)
Patient needs refills on both Adderall 5mg  and 10mg  sent to [facility] [address]

## 2022-05-02 ENCOUNTER — Telehealth (HOSPITAL_COMMUNITY): Payer: Self-pay

## 2022-05-02 ENCOUNTER — Other Ambulatory Visit (HOSPITAL_COMMUNITY): Payer: Self-pay | Admitting: Psychiatry

## 2022-05-02 MED ORDER — AMPHETAMINE-DEXTROAMPHETAMINE 10 MG PO TABS: 10.0000 mg | ORAL_TABLET | Freq: Every day | ORAL | 0 refills | Status: DC

## 2022-05-02 NOTE — Telephone Encounter (Signed)
Resent; the note actually said not to fill until August 2022 so that was an old note that had not been taken off

## 2022-05-02 NOTE — Telephone Encounter (Signed)
Can we resend [facility]all 10mg  to [facility] [address]? Note says do not refill until August but medication is due today. Please advise.

## 2022-05-07 ENCOUNTER — Other Ambulatory Visit: Payer: Self-pay | Admitting: Family Medicine

## 2022-05-07 DIAGNOSIS — G4709 Other insomnia: Secondary | ICD-10-CM

## 2022-05-30 ENCOUNTER — Other Ambulatory Visit: Payer: Self-pay | Admitting: Physician Assistant

## 2022-05-30 ENCOUNTER — Telehealth (HOSPITAL_COMMUNITY): Payer: Self-pay | Admitting: Psychiatry

## 2022-05-30 DIAGNOSIS — G4709 Other insomnia: Secondary | ICD-10-CM

## 2022-05-30 MED ORDER — AMPHETAMINE-DEXTROAMPHETAMINE 5 MG PO TABS: 5.0000 mg | ORAL_TABLET | Freq: Every day | ORAL | 0 refills | Status: DC

## 2022-05-30 MED ORDER — AMPHETAMINE-DEXTROAMPHETAMINE 10 MG PO TABS: 10.0000 mg | ORAL_TABLET | Freq: Every day | ORAL | 0 refills | Status: DC

## 2022-05-30 NOTE — Telephone Encounter (Signed)
Left message- Informed pt rx was sent.  Nothing Further Needed at this time.

## 2022-05-30 NOTE — Telephone Encounter (Signed)
Pt calling to get refills on adderall 10mg  & 5mg .  [facility] [address]   Next Visit - 7/28 Last Visit -3/24  Cb [phone]

## 2022-06-22 ENCOUNTER — Encounter (HOSPITAL_COMMUNITY): Payer: Self-pay | Admitting: Psychiatry

## 2022-06-22 ENCOUNTER — Telehealth (INDEPENDENT_AMBULATORY_CARE_PROVIDER_SITE_OTHER): Payer: 59 | Admitting: Psychiatry

## 2022-06-22 DIAGNOSIS — F411 Generalized anxiety disorder: Secondary | ICD-10-CM

## 2022-06-22 DIAGNOSIS — F5102 Adjustment insomnia: Secondary | ICD-10-CM

## 2022-06-22 DIAGNOSIS — F900 Attention-deficit hyperactivity disorder, predominantly inattentive type: Secondary | ICD-10-CM

## 2022-06-22 MED ORDER — AMPHETAMINE-DEXTROAMPHETAMINE 5 MG PO TABS
5.0000 mg | ORAL_TABLET | Freq: Every day | ORAL | 0 refills | Status: DC
Start: 2022-06-22 — End: 2022-07-23

## 2022-06-22 MED ORDER — AMPHETAMINE-DEXTROAMPHETAMINE 10 MG PO TABS
10.0000 mg | ORAL_TABLET | Freq: Every day | ORAL | 0 refills | Status: DC
Start: 2022-06-22 — End: 2022-07-23

## 2022-06-22 NOTE — Progress Notes (Signed)
[facility] Follow up visit  Patient Identification: Miranda Hunter MRN:  [id number] Date of Evaluation:  06/22/2022  Chief complaint: follow up adhd Referral Source: primary care Visit Diagnosis:    ICD-10-CM   1. Attention deficit hyperactivity disorder (ADHD), predominantly inattentive type  F90.0     2. GAD (generalized anxiety disorder)  F41.1     3. Adjustment insomnia  F51.02      Virtual Visit via Video Note  I connected with Miranda Hunter on 06/22/22 at 10:30 AM EDT by a video enabled telemedicine application and verified that I am speaking with the correct person using two identifiers.  Location: Patient: home Provider: home office   I discussed the limitations of evaluation and management by telemedicine and the availability of in person appointments. The patient expressed understanding and agreed to proceed.      I discussed the assessment and treatment plan with the patient. The patient was provided an opportunity to ask questions and all were answered. The patient agreed with the plan and demonstrated an understanding of the instructions.   The patient was advised to call back or seek an in-person evaluation if the symptoms worsen or if the condition fails to improve as anticipated.  I provided  15 minutes of non-face-to-face time during this encounter including chart review and documenation         History of Present Illness: 36  years old single White female referred by PCP for management of inattention.  Doing fair, attention manageable with adderall and 5mg  extra prn Anxiety fair   Aggravating factors; history of ADHD.  Grandparent death Modifying factors; her dog, job , family   Duration ADHD since young age;  Severity improved        Past Psychiatric History: anxiety, inattention  Previous Psychotropic Medications: Yes   Substance Abuse History in the last 12 months:  No.  Consequences of Substance Abuse: NA  Past Medical History:   Past Medical History:  Diagnosis Date   Anxiety    Asthma    History of chicken pox    IBS (irritable bowel syndrome)    Migraines     Past Surgical History:  Procedure Laterality Date   tonsillectomy and adnoidectomy Bilateral 2007    Family Psychiatric History: denies  Family History:  Family History  Problem Relation Age of Onset   Migraines Mother    Prostate cancer Father    Hypertension Father    Asthma Sister    Lung cancer Maternal Grandmother        heavy smoker   Migraines Maternal Grandmother    Lung cancer Maternal Grandfather        heavy smoker   Asthma Sister     Social History:   Social History   Socioeconomic History   Marital status: Single    Spouse name: Not on file   Number of children: 0   Years of education: Not on file   Highest education level: Bachelor's degree (e.g., BA, AB, BS)  Occupational History   Not on file  Tobacco Use   Smoking status: Never   Smokeless tobacco: Never  Vaping Use   Vaping Use: Never used  Substance and Sexual Activity   Alcohol use: Yes    Comment: socially   Drug use: No   Sexual activity: Not Currently    Comment: kyleena  Other Topics Concern   Not on file  Social History Narrative   She is a 2006   "[name]"   Recently  got a new puppy- a Advertising account planner named Miranda Hunter   Caffeine once a week      Social Determinants of Corporate investment banker Strain: Not on file  Food Insecurity: Not on file  Transportation Needs: Not on file  Physical Activity: Not on file  Stress: Not on file  Social Connections: Not on file     Allergies:   Allergies  Allergen Reactions   Amoxicillin-Pot Clavulanate Diarrhea    Metabolic Disorder Labs: Lab Results  Component Value Date   HGBA1C 6.0 (H) 08/24/2021   No results found for: "PROLACTIN" Lab Results  Component Value Date   CHOL 196 08/24/2021   TRIG 157 (H) 08/24/2021   HDL 39 (L) 08/24/2021   CHOLHDL 5.0 (H) 08/24/2021   LDLCALC 129 (H)  08/24/2021   LDLCALC 104 (H) 02/08/2021   Lab Results  Component Value Date   TSH 1.260 05/04/2020    Therapeutic Level Labs: No results found for: "LITHIUM" No results found for: "CBMZ" No results found for: "VALPROATE"  Current Medications: Current Outpatient Medications  Medication Sig Dispense Refill   albuterol (VENTOLIN HFA) 108 (90 Base) MCG/ACT inhaler Inhale 2 puffs into the lungs every 6 (six) hours as needed for wheezing or shortness of breath. 18 g 2   amphetamine-dextroamphetamine (ADDERALL) 10 MG tablet Take 1 tablet (10 mg total) by mouth daily. 30 tablet 0   amphetamine-dextroamphetamine (ADDERALL) 5 MG tablet Take 1 tablet (5 mg total) by mouth daily. This is in addition to 10mg  . Take 10mg  in the am and this 5mg  at noon 30 tablet 0   augmented betamethasone dipropionate (DIPROLENE-AF) 0.05 % ointment Apply topically.     cetirizine (ZYRTEC) 10 MG tablet Take 10 mg by mouth daily.     chlorpheniramine-HYDROcodone (TUSSIONEX PENNKINETIC ER) 10-8 MG/5ML Take 5 mLs by mouth every 12 (twelve) hours as needed for cough. 115 mL 0   Clindamycin-Benzoyl Per, Refr, gel Apply 1 application topically daily.     escitalopram (LEXAPRO) 20 MG tablet Take 1 tablet (20 mg total) by mouth daily. 90 tablet 1   Flaxseed, Linseed, (FLAX SEEDS PO) Take 1 tablet by mouth at bedtime.     fluticasone (FLONASE) 50 MCG/ACT nasal spray      medroxyPROGESTERone (DEPO-PROVERA) 150 MG/ML injection      metFORMIN (GLUCOPHAGE) 1000 MG tablet Take 1 tablet (1,000 mg total) by mouth 2 (two) times daily with a meal. 60 tablet 0   Multiple Vitamin (MULTIVITAMIN) tablet Take 1 tablet by mouth daily.     mupirocin ointment (BACTROBAN) 2 % SMARTSIG:1 Application Topical 2-3 Times Daily     ondansetron (ZOFRAN-ODT) 4 MG disintegrating tablet TAKE 1-2 TABLETS BY MOUTH EVERY 8 HOURS AS NEEDED FOR NAUSEA. 30 tablet 1   rizatriptan (MAXALT-MLT) 10 MG disintegrating tablet Take 1 tablet (10 mg total) by mouth as  needed for migraine. May repeat in 2 hours if needed 9 tablet 11   traZODone (DESYREL) 50 MG tablet TAKE 1 TABLET BY MOUTH EVERYDAY AT BEDTIME 90 tablet 0   No current facility-administered medications for this visit.      Psychiatric Specialty Exam: Review of Systems  Cardiovascular:  Negative for chest pain.  Neurological:  Negative for weakness.  Psychiatric/Behavioral:  Positive for decreased concentration. Negative for agitation.     There were no vitals taken for this visit.There is no height or weight on file to calculate BMI.  General Appearance: Casual  Eye Contact:  Fair  Speech:  Slow  Volume:  Normal  Mood:fair  Affect:  Congruent  Thought Process:  Goal Directed  Orientation:  Full (Time, Place, and Person)  Thought Content:  Logical  Suicidal Thoughts:  No  Homicidal Thoughts:  No  Memory:  Immediate;   Fair Recent;   Fair  Judgement:  Fair  Insight:  Fair  Psychomotor Activity:  Normal  Concentration:  Concentration: Fair and Attention Span: variable  Recall:  Good  Fund of Knowledge:Good  Language: Good  Akathisia:  No  Handed:   AIMS (if indicated):  not done  Assets:  Desire for Improvement Housing  ADL's:  Intact  Cognition: WNL  Sleep:  Fair   Screenings: PHQ2-9    Flowsheet Row Office Visit from 12/03/2018 in [facility] Office Visit from 09/03/2017 in [facility]  PHQ-2 Total Score 5 0  PHQ-9 Total Score 13 --      Flowsheet Row Video Visit from [facility]/22/2022 in [facility] [place] Video Visit from 02/16/2022 in [facility] [place] Video Visit from 11/10/2021 in [facility]   C-SS[facility] RISK CATEGORY No Risk No Risk No Risk       Assessment and Plan: as follows   Prior documentation reviewed     ADHD improved continue adderall 10 plus 5mg . No side effects  Generalized anxiety disorder;doing fair,  continue lexapro from pcp  Insomnia: fair on trazadone  Fu 23m. Refills of adderall sent 4m, MD 7/28/202310:41 AM

## 2022-06-30 ENCOUNTER — Other Ambulatory Visit: Payer: Self-pay | Admitting: Physician Assistant

## 2022-06-30 DIAGNOSIS — F3289 Other specified depressive episodes: Secondary | ICD-10-CM

## 2022-07-04 ENCOUNTER — Encounter (INDEPENDENT_AMBULATORY_CARE_PROVIDER_SITE_OTHER): Payer: Self-pay

## 2022-07-23 ENCOUNTER — Telehealth (HOSPITAL_COMMUNITY): Payer: Self-pay

## 2022-07-23 MED ORDER — AMPHETAMINE-DEXTROAMPHETAMINE 10 MG PO TABS
10.0000 mg | ORAL_TABLET | Freq: Every day | ORAL | 0 refills | Status: DC
Start: 2022-07-23 — End: 2022-08-21

## 2022-07-23 MED ORDER — AMPHETAMINE-DEXTROAMPHETAMINE 5 MG PO TABS: 5.0000 mg | ORAL_TABLET | Freq: Every day | ORAL | 0 refills | Status: DC

## 2022-07-23 NOTE — Telephone Encounter (Signed)
Patient needs refills on Adderall 5mg  and Adderall 10mg  sent to [facility] [address] in [place] Last refill 07/28 Next appt 12/20

## 2022-08-09 ENCOUNTER — Encounter: Payer: Self-pay | Admitting: Family Medicine

## 2022-08-09 ENCOUNTER — Ambulatory Visit: Payer: 59 | Admitting: Family Medicine

## 2022-08-09 VITALS — BP 122/78 | HR 113 | Temp 98.4°F | Wt 271.4 lb

## 2022-08-09 DIAGNOSIS — R0981 Nasal congestion: Secondary | ICD-10-CM | POA: Diagnosis not present

## 2022-08-09 DIAGNOSIS — R059 Cough, unspecified: Secondary | ICD-10-CM | POA: Insufficient documentation

## 2022-08-09 LAB — POCT INFLUENZA A/B
Influenza A, POC: NEGATIVE
Influenza B, POC: NEGATIVE

## 2022-08-09 LAB — POC COVID19 BINAXNOW: SARS Coronavirus 2 Ag: NEGATIVE

## 2022-08-09 MED ORDER — IPRATROPIUM BROMIDE 0.06 % NA SOLN: 2.0000 | Freq: Four times a day (QID) | NASAL | 12 refills | Status: DC

## 2022-08-09 MED ORDER — GUAIFENESIN-CODEINE 100-10 MG/5ML PO SOLN: 5.0000 mL | Freq: Three times a day (TID) | ORAL | 0 refills | Status: DC | PRN

## 2022-08-09 NOTE — Patient Instructions (Signed)
Please be sure to drink plenty of fluids.   You may take the following OTC medications to help with symptoms:  For cough, use  cough syrups or other cough suppressants.  For headache, sore throat, fevers, muscle aches, chills, other pain, take ibuprofen or tylenol  For congestion, use nasal sprays, decongestants, or antihistamines  Please follow up if no improvement.   Go to ED if you have severe chest pain, fevers, shortness of breath or other worrisome symptoms.   

## 2022-08-09 NOTE — Assessment & Plan Note (Signed)
Likely viral infection, Treat symptomatically with ipratropium for nasal congestion and codeine cough syrup Can continue OTC remedies as needed

## 2022-08-09 NOTE — Progress Notes (Signed)
Assessment/Plan:   Problem List Items Addressed This Visit   None Visit Diagnoses     Cough, unspecified type    -  Primary   Relevant Orders   POCT Influenza A/B (Completed)   POC COVID-19 (Completed)          Subjective:  HPI:  Miranda Hunter is a 36 y.o. female who has Moderate persistent asthma; Migraine with aura and without status migrainosus, not intractable; Obesity (BMI 30-39.9); Depression; Class 2 obesity due to excess calories with body mass index (BMI) of 37.0 to 37.9 in adult; Insulin resistance; Cervical intraepithelial neoplasia grade 1; Prediabetes; Transaminitis; Hyperlipidemia, mixed; Other insomnia; Attention deficit hyperactivity disorder (ADHD); Vitamin D deficiency; and Fever, low grade on their problem list..   She  has a past medical history of Anxiety, Asthma, History of chicken pox, IBS (irritable bowel syndrome), and Migraines..   She presents with chief complaint of Sinus Problem (Headache, cough, sinus congestion, ) .   Upper Respiratory Infection: Patient complains of symptoms of a URI, possible sinusitis. Symptoms include bilateral ear pain, congestion, cough, and sore throat. Onset of symptoms was 5 days ago, gradually improving since that time.  She is drinking plenty of fluids. Evaluation to date: none. Treatment to date:  fluids rest, ibuprofen, Flonase, Sudafed, mucinex .  The patient denies chest pain, dyspnea, wheezing or hemoptysis.   Past Surgical History:  Procedure Laterality Date   tonsillectomy and adnoidectomy Bilateral 2007    Outpatient Medications Prior to Visit  Medication Sig Dispense Refill   albuterol (VENTOLIN HFA) 108 (90 Base) MCG/ACT inhaler Inhale 2 puffs into the lungs every 6 (six) hours as needed for wheezing or shortness of breath. 18 g 2   amphetamine-dextroamphetamine (ADDERALL) 10 MG tablet Take 1 tablet (10 mg total) by mouth daily. 60 tablet 0   amphetamine-dextroamphetamine (ADDERALL) 5 MG tablet Take 1  tablet (5 mg total) by mouth daily. This is in addition to 10mg  . Take 10mg  in the am and this 5mg  at noon 60 tablet 0   augmented betamethasone dipropionate (DIPROLENE-AF) 0.05 % ointment Apply topically.     cetirizine (ZYRTEC) 10 MG tablet Take 10 mg by mouth daily.     chlorpheniramine-HYDROcodone (TUSSIONEX PENNKINETIC ER) 10-8 MG/5ML Take 5 mLs by mouth every 12 (twelve) hours as needed for cough. 115 mL 0   Clindamycin-Benzoyl Per, Refr, gel Apply 1 application topically daily.     escitalopram (LEXAPRO) 20 MG tablet TAKE 1 TABLET BY MOUTH EVERY DAY 90 tablet 1   fluticasone (FLONASE) 50 MCG/ACT nasal spray      medroxyPROGESTERone (DEPO-PROVERA) 150 MG/ML injection      metFORMIN (GLUCOPHAGE) 1000 MG tablet Take 1 tablet (1,000 mg total) by mouth 2 (two) times daily with a meal. 60 tablet 0   Multiple Vitamin (MULTIVITAMIN) tablet Take 1 tablet by mouth daily.     mupirocin ointment (BACTROBAN) 2 % SMARTSIG:1 Application Topical 2-3 Times Daily     ondansetron (ZOFRAN-ODT) 4 MG disintegrating tablet TAKE 1-2 TABLETS BY MOUTH EVERY 8 HOURS AS NEEDED FOR NAUSEA. 30 tablet 1   rizatriptan (MAXALT-MLT) 10 MG disintegrating tablet Take 1 tablet (10 mg total) by mouth as needed for migraine. May repeat in 2 hours if needed 9 tablet 11   SLYND 4 MG TABS Take 1 tablet by mouth daily.     traZODone (DESYREL) 50 MG tablet TAKE 1 TABLET BY MOUTH EVERYDAY AT BEDTIME 90 tablet 0   Flaxseed, Linseed, (FLAX SEEDS PO) Take  1 tablet by mouth at bedtime. (Patient not taking: Reported on 08/09/2022)     No facility-administered medications prior to visit.    Family History  Problem Relation Age of Onset   Migraines Mother    Prostate cancer Father    Hypertension Father    Asthma Sister    Lung cancer Maternal Grandmother        heavy smoker   Migraines Maternal Grandmother    Lung cancer Maternal Grandfather        heavy smoker   Asthma Sister     Social History   Socioeconomic History    Marital status: Single    Spouse name: Not on file   Number of children: 0   Years of education: Not on file   Highest education level: Bachelor's degree (e.g., BA, AB, BS)  Occupational History   Not on file  Tobacco Use   Smoking status: Never   Smokeless tobacco: Never  Vaping Use   Vaping Use: Never used  Substance and Sexual Activity   Alcohol use: Yes    Comment: socially   Drug use: No   Sexual activity: Not Currently    Comment: kyleena  Other Topics Concern   Not on file  Social History Narrative   She is a Corporate investment banker   "[name]"   Recently got a new puppy- a Advertising account planner named [name]   Caffeine once a week      Social Determinants of Corporate investment banker Strain: Not on file  Food Insecurity: Not on file  Transportation Needs: Not on file  Physical Activity: Not on file  Stress: Not on file  Social Connections: Not on file  Intimate Partner Violence: Not on file                                                                                                 Objective:  Physical Exam: BP 122/78 (BP Location: Right Arm, Patient Position: Sitting, Cuff Size: Large)   Pulse (!) 113   Temp 98.4 F (36.9 C) (Oral)   Wt 271 lb 6.4 oz (123.1 kg)   SpO2 97%   BMI 40.08 kg/m    General: No acute distress. Awake and conversant.  Eyes: Normal conjunctiva, anicteric. Round symmetric pupils.  ENT: Hearing grossly intact. No nasal discharge.  Bilateral tympanic membranes are normal, some cerumen in right ear canal.  Erythematous posterior oropharynx, MMM Neck: Neck is supple. No masses or thyromegaly.  Respiratory: Respirations are non-labored. No auditory wheezing.  CTA B Skin: Warm. No rashes or ulcers.  Psych: Alert and oriented. Cooperative, Appropriate mood and affect, Normal judgment.  CV: No cyanosis or JVD, RRR, no MRG ABD: Nontender nondistended MSK: Normal ambulation. No clubbing  Neuro: Sensation and CN II-XII grossly normal.  COVID and flu were  negative      Garner Nash, MD, MS

## 2022-08-13 ENCOUNTER — Encounter: Payer: Self-pay | Admitting: Physician Assistant

## 2022-08-13 ENCOUNTER — Telehealth (INDEPENDENT_AMBULATORY_CARE_PROVIDER_SITE_OTHER): Payer: 59 | Admitting: Physician Assistant

## 2022-08-13 VITALS — Ht 69.0 in | Wt 271.0 lb

## 2022-08-13 DIAGNOSIS — G4709 Other insomnia: Secondary | ICD-10-CM

## 2022-08-13 DIAGNOSIS — R0981 Nasal congestion: Secondary | ICD-10-CM | POA: Diagnosis not present

## 2022-08-13 MED ORDER — HYDROCOD POLI-CHLORPHE POLI ER 10-8 MG/5ML PO SUER: 5.0000 mL | Freq: Two times a day (BID) | ORAL | 0 refills | Status: DC | PRN

## 2022-08-13 MED ORDER — TRAZODONE HCL 100 MG PO TABS: 100.0000 mg | ORAL_TABLET | Freq: Every day | ORAL | 1 refills | Status: DC

## 2022-08-13 MED ORDER — DOXYCYCLINE HYCLATE 100 MG PO TABS: 100.0000 mg | ORAL_TABLET | Freq: Two times a day (BID) | ORAL | 0 refills | Status: DC

## 2022-08-13 NOTE — Progress Notes (Signed)
I acted as a Education administrator for [employer], PA-C Anselmo Pickler, LPN  Virtual Visit via Video Note   I Inda Coke, PA, connected with  Amyria Komar  ([id number], Nov 17, 1986) on 08/13/22 at  3:20 PM EDT by a video-enabled telemedicine application and verified that I am speaking with the correct person using two identifiers.  Location: Patient: Home Provider: [place] office   I discussed the limitations of evaluation and management by telemedicine and the availability of in person appointments. The patient expressed understanding and agreed to proceed.    History of Present Illness: Miranda Hunter is a 36 y.o. who identifies as a female who was assigned female at birth, and is being seen today for sinus problem. Pt saw Dr. Grandville Silos on Thurs last week. Was given Cheratussin and Atroven nasal spray. Has been using both of these without improvement. Pt c/o increase in sinus pressure. Denies fever or chills. Neg COVID test. Taking Ibuprofen and tylenol. Trialed mucinex.  Insomnia Currently taking 50 mg trazodone daily. Tolerating well but feels like it is less effective than it used to be. She is having difficulty falling asleep. Trialed magnesium without significant benefit.  Problems:  Patient Active Problem List   Diagnosis Date Noted   Cough 08/09/2022   Fever, low grade 12/04/2021   Vitamin D deficiency 11/09/2020   Attention deficit hyperactivity disorder (ADHD) 10/14/2020   Prediabetes 06/06/2020   Transaminitis 06/06/2020   Hyperlipidemia, mixed 06/06/2020   Other insomnia 06/06/2020   Cervical intraepithelial neoplasia grade 1 04/27/2020   Insulin resistance 10/01/2019   Depression 04/28/2019   Class 2 obesity due to excess calories with body mass index (BMI) of 37.0 to 37.9 in adult 04/28/2019   Migraine with aura and without status migrainosus, not intractable 10/14/2018   Obesity (BMI 30-39.9) 10/14/2018   Moderate persistent asthma 09/16/2018     Allergies:  Allergies  Allergen Reactions   Amoxicillin-Pot Clavulanate Diarrhea   Medications:  Current Outpatient Medications:    albuterol (VENTOLIN HFA) 108 (90 Base) MCG/ACT inhaler, Inhale 2 puffs into the lungs every 6 (six) hours as needed for wheezing or shortness of breath., Disp: 18 g, Rfl: 2   amphetamine-dextroamphetamine (ADDERALL) 10 MG tablet, Take 1 tablet (10 mg total) by mouth daily., Disp: 60 tablet, Rfl: 0   amphetamine-dextroamphetamine (ADDERALL) 5 MG tablet, Take 1 tablet (5 mg total) by mouth daily. This is in addition to 10mg  . Take 10mg  in the am and this 5mg  at noon, Disp: 60 tablet, Rfl: 0   augmented betamethasone dipropionate (DIPROLENE-AF) 0.05 % ointment, Apply topically., Disp: , Rfl:    cetirizine (ZYRTEC) 10 MG tablet, Take 10 mg by mouth daily., Disp: , Rfl:    chlorpheniramine-HYDROcodone (TUSSIONEX) 10-8 MG/5ML, Take 5 mLs by mouth every 12 (twelve) hours as needed for cough., Disp: 120 mL, Rfl: 0   Clindamycin-Benzoyl Per, Refr, gel, Apply 1 application topically daily., Disp: , Rfl:    doxycycline (VIBRA-TABS) 100 MG tablet, Take 1 tablet (100 mg total) by mouth 2 (two) times daily., Disp: 14 tablet, Rfl: 0   escitalopram (LEXAPRO) 20 MG tablet, TAKE 1 TABLET BY MOUTH EVERY DAY, Disp: 90 tablet, Rfl: 1   fluticasone (FLONASE) 50 MCG/ACT nasal spray, , Disp: , Rfl:    ipratropium (ATROVENT) 0.06 % nasal spray, Place 2 sprays into both nostrils 4 (four) times daily., Disp: 15 mL, Rfl: 12   Multiple Vitamin (MULTIVITAMIN) tablet, Take 1 tablet by mouth daily., Disp: , Rfl:  mupirocin ointment (BACTROBAN) 2 %, SMARTSIG:1 Application Topical 2-3 Times Daily, Disp: , Rfl:    ondansetron (ZOFRAN-ODT) 4 MG disintegrating tablet, TAKE 1-2 TABLETS BY MOUTH EVERY 8 HOURS AS NEEDED FOR NAUSEA., Disp: 30 tablet, Rfl: 1   rizatriptan (MAXALT-MLT) 10 MG disintegrating tablet, Take 1 tablet (10 mg total) by mouth as needed for migraine. May repeat in 2 hours if  needed, Disp: 9 tablet, Rfl: 11   SLYND 4 MG TABS, Take 1 tablet by mouth daily., Disp: , Rfl:    traZODone (DESYREL) 100 MG tablet, Take 1 tablet (100 mg total) by mouth at bedtime., Disp: 90 tablet, Rfl: 1  Observations/Objective: Patient is well-developed, well-nourished in no acute distress.  Resting comfortably  at home.  Head is normocephalic, atraumatic.  No labored breathing.  Speech is clear and coherent with logical content.  Patient is alert and oriented at baseline.   Assessment and Plan: 1. Sinus congestion No red flags Suspect sinusitis Start oral doxycycline and Tussionex Follow-up if new/worsening  2. Other insomnia No red flags Increase trazodone to 100 mg daily Follow-up in 3-6 months if needed  Follow Up Instructions: I discussed the assessment and treatment plan with the patient. The patient was provided an opportunity to ask questions and all were answered. The patient agreed with the plan and demonstrated an understanding of the instructions.  A copy of instructions were sent to the patient via MyChart unless otherwise noted below.   The patient was advised to call back or seek an in-person evaluation if the symptoms worsen or if the condition fails to improve as anticipated.  Jarold Motto, [place]

## 2022-08-20 ENCOUNTER — Encounter: Payer: Self-pay | Admitting: Physician Assistant

## 2022-08-20 ENCOUNTER — Encounter: Payer: Self-pay | Admitting: *Deleted

## 2022-08-21 ENCOUNTER — Telehealth: Payer: Self-pay | Admitting: Physician Assistant

## 2022-08-21 ENCOUNTER — Telehealth (HOSPITAL_COMMUNITY): Payer: Self-pay

## 2022-08-21 ENCOUNTER — Other Ambulatory Visit (HOSPITAL_COMMUNITY): Payer: Self-pay | Admitting: Psychiatry

## 2022-08-21 MED ORDER — AMPHETAMINE-DEXTROAMPHETAMINE 10 MG PO TABS: 10.0000 mg | ORAL_TABLET | Freq: Every day | ORAL | 0 refills | Status: DC

## 2022-08-21 MED ORDER — AMPHETAMINE-DEXTROAMPHETAMINE 5 MG PO TABS: 5.0000 mg | ORAL_TABLET | Freq: Every day | ORAL | 0 refills | Status: DC

## 2022-08-21 NOTE — Telephone Encounter (Signed)
Patient Name: Miranda Hunter Gender: Female DOB: 1986/08/08 Age: 36 Y 10 M 20 D Return Phone Number: [phone] (Primary) Address: City/ State/ Zip: [place]  [address] Client [place] [facility] Client Site [facility] Day Provider Morene Rankins, [place]- [place] Contact Type Call Who Is Calling Patient / Member / Family / Caregiver Call Type Triage / Clinical Relationship To Patient Self Return Phone Number [phone] (Primary) Chief Complaint Rash - Widespread Reason for Call Symptomatic / Request for [place] states she started getting a rash and it has gotten worst. It is widespread. She is alarmed and doesn't know what to do. Translation No Nurse Assessment Nurse: Ysidro Evert, RN, Levada Dy Date/Time (Eastern Time): 08/21/2022 12:31:13 PM Confirm and document reason for call. If symptomatic, describe symptoms. ---Caller states she has a widespread rash that started on Saturday. No fever. It looks like red bumps of varying sizes> she is takin doxycycline and her last does is tonight Does the patient have any new or worsening symptoms? ---Yes Will a triage be completed? ---Yes Related visit to physician within the last 2 weeks? ---Yes Does the PT have any chronic conditions? (i.e. diabetes, asthma, this includes High risk factors for pregnancy, etc.) ---Yes List chronic conditions. ---asthma, excema Is the patient pregnant or possibly pregnant? (Ask all females between the ages of 28-55) ---No Is this a behavioral health or substance abuse call? ---No Guidelines Guideline Title Affirmed Question Affirmed Notes Nurse Date/Time (Eastern Time) Rash - Widespread On Drugs Hives or itching Earleen Reaper 08/21/2022 12:33:13 PM  Disp. Time Eilene Ghazi Time) Disposition Final User 08/21/2022 12:37:08 PM See PCP within 24 Hours Yes Ysidro Evert, RN, Levada Dy Final Disposition 08/21/2022  12:37:08 PM See PCP within 24 Hours Yes Ysidro Evert, RN, Marin Shutter Disagree/Comply Comply Caller Understands Yes PreDisposition Did not know what to do Care Advice Given Per Guideline SEE PCP WITHIN 24 HOURS: * IF OFFICE WILL BE OPEN: You need to be examined within the next 24 hours. Call your doctor (or NP/PA) when the office opens and make an appointment. STOP THE MEDICINE: * Severe itching and rash can be a sign of a drug allergy. * Stop the medicine until you are examined. REDUCING THE ITCH - OATMEAL (AVEENO) BATH: * Sprinkle contents of one packet of Aveeno under running faucet with comfortably warm water. * Bathe for 15 to 20 minutes, 1 to 2 times daily. * Pat dry using towel - do not rub. * You can also rub very itchy areas with an ice cube for 10 minutes. CALL BACK IF: * You become worse CARE ADVICE given per Rash - Widespread on Drugs (Adult) guideline Referrals REFERRED TO PCP OFFICE

## 2022-08-21 NOTE — Telephone Encounter (Signed)
Patient states the following: Patient started getting a rash on 9/23 and it has progressively gotten worse-rash began on stomach then spread to breasts-then spread to back and both legs. Breasts and stomach are most itchy.  Today, Patient states she is alarmed at how bad it is.   Transferred to Triage.

## 2022-08-21 NOTE — Telephone Encounter (Signed)
sent 

## 2022-08-21 NOTE — Telephone Encounter (Signed)
Sorry, she needs both 5mg  and 10mg 

## 2022-08-21 NOTE — Telephone Encounter (Signed)
She has 2 different dosages of adderall listed. Which one does she need?

## 2022-08-21 NOTE — Telephone Encounter (Signed)
See Triage note. Pt scheduled to see you tomorrow.

## 2022-08-21 NOTE — Telephone Encounter (Signed)
Patient needs a refill on Adderall. Last month Dr. De Nurse sent a 60 day supply but insurance would not  let them fill it for 60 so she only had 30 day supply filled. Patient needs a 30 day supply [facility] to [facility] [address] in [place] Last refill 08/28 Next appt 12/20

## 2022-08-22 ENCOUNTER — Encounter: Payer: Self-pay | Admitting: Physician Assistant

## 2022-08-22 ENCOUNTER — Ambulatory Visit: Payer: 59 | Admitting: Physician Assistant

## 2022-08-22 VITALS — BP 122/80 | HR 102 | Temp 97.7°F | Ht 69.0 in | Wt 275.0 lb

## 2022-08-22 DIAGNOSIS — G43809 Other migraine, not intractable, without status migrainosus: Secondary | ICD-10-CM | POA: Diagnosis not present

## 2022-08-22 DIAGNOSIS — R21 Rash and other nonspecific skin eruption: Secondary | ICD-10-CM | POA: Diagnosis not present

## 2022-08-22 MED ORDER — METHYLPREDNISOLONE ACETATE 80 MG/ML IJ SUSP
80.0000 mg | Freq: Once | INTRAMUSCULAR | Status: AC
  Administered 2022-08-22: 80 mg via INTRAMUSCULAR

## 2022-08-22 MED ORDER — PREDNISONE 10 MG PO TABS: ORAL_TABLET | ORAL | 0 refills | Status: DC

## 2022-08-22 MED ORDER — RIZATRIPTAN BENZOATE 10 MG PO TBDP: 10.0000 mg | ORAL_TABLET | ORAL | 11 refills | Status: DC | PRN

## 2022-08-22 NOTE — Patient Instructions (Addendum)
It was great to see you!  Steroid injection today Start oral prednisone as prescribed -- tomorrow  Start over the counter pepcid (famotidine) daily -- take for 1-2 weeks  May taken 25 mg benadryl up to 3 times daily for breakthrough itching/rash  If new/worsening symptoms, please let me know  No more doxy for now!  Take care,  Inda Coke PA-C

## 2022-08-22 NOTE — Progress Notes (Signed)
Miranda Hunter is a 36 y.o. female here for a follow up of a pre-existing problem.  History of Present Illness:   Chief Complaint  Patient presents with   Rash    Pt c/o persistent rash, spreading, itching some areas.    Rash    Rash She has recently started doxycycline for her URI on 9/18 She broke out in a rash on 9/25 Rash has progressively worsened and she stopped doxy yesterday Denies: itchy throat/mouth/tongue, SOB, wheezing, prior rash with medications, change in environment/detergents/pets  Migraine She also woke up with migraine today She is out of her maxalt and needs refill of this Denies new/worsening symptoms that are atypical of past migraines  Past Medical History:  Diagnosis Date   Anxiety    Asthma    History of chicken pox    IBS (irritable bowel syndrome)    Migraines      Social History   Tobacco Use   Smoking status: Never   Smokeless tobacco: Never  Vaping Use   Vaping Use: Never used  Substance Use Topics   Alcohol use: Yes    Comment: socially   Drug use: No    Past Surgical History:  Procedure Laterality Date   tonsillectomy and adnoidectomy Bilateral 2007    Family History  Problem Relation Age of Onset   Migraines Mother    Prostate cancer Father    Hypertension Father    Asthma Sister    Lung cancer Maternal Grandmother        heavy smoker   Migraines Maternal Grandmother    Lung cancer Maternal Grandfather        heavy smoker   Asthma Sister     Allergies  Allergen Reactions   Amoxicillin-Pot Clavulanate Diarrhea    Current Medications:   Current Outpatient Medications:    albuterol (VENTOLIN HFA) 108 (90 Base) MCG/ACT inhaler, Inhale 2 puffs into the lungs every 6 (six) hours as needed for wheezing or shortness of breath., Disp: 18 g, Rfl: 2   amphetamine-dextroamphetamine (ADDERALL) 10 MG tablet, Take 1 tablet (10 mg total) by mouth daily., Disp: 30 tablet, Rfl: 0   amphetamine-dextroamphetamine (ADDERALL) 5  MG tablet, Take 1 tablet (5 mg total) by mouth daily. This is in addition to 10mg  . Take 10mg  in the am and this 5mg  at noon, Disp: 30 tablet, Rfl: 0   augmented betamethasone dipropionate (DIPROLENE-AF) 0.05 % ointment, Apply topically., Disp: , Rfl:    cetirizine (ZYRTEC) 10 MG tablet, Take 10 mg by mouth daily., Disp: , Rfl:    Clindamycin-Benzoyl Per, Refr, gel, Apply 1 application topically daily., Disp: , Rfl:    escitalopram (LEXAPRO) 20 MG tablet, TAKE 1 TABLET BY MOUTH EVERY DAY, Disp: 90 tablet, Rfl: 1   fluticasone (FLONASE) 50 MCG/ACT nasal spray, , Disp: , Rfl:    ipratropium (ATROVENT) 0.06 % nasal spray, Place 2 sprays into both nostrils 4 (four) times daily., Disp: 15 mL, Rfl: 12   Multiple Vitamin (MULTIVITAMIN) tablet, Take 1 tablet by mouth daily., Disp: , Rfl:    mupirocin ointment (BACTROBAN) 2 %, SMARTSIG:1 Application Topical 2-3 Times Daily, Disp: , Rfl:    ondansetron (ZOFRAN-ODT) 4 MG disintegrating tablet, TAKE 1-2 TABLETS BY MOUTH EVERY 8 HOURS AS NEEDED FOR NAUSEA., Disp: 30 tablet, Rfl: 1   rizatriptan (MAXALT-MLT) 10 MG disintegrating tablet, Take 1 tablet (10 mg total) by mouth as needed for migraine. May repeat in 2 hours if needed, Disp: 9 tablet, Rfl: 11  SLYND 4 MG TABS, Take 1 tablet by mouth daily., Disp: , Rfl:    traZODone (DESYREL) 100 MG tablet, Take 1 tablet (100 mg total) by mouth at bedtime., Disp: 90 tablet, Rfl: 1   Review of Systems:   Review of Systems  Skin:  Positive for rash.   Negative unless otherwise specified per HPI.  Vitals:   Vitals:   08/22/22 1055  BP: 122/80  Pulse: (!) 102  Temp: 97.7 F (36.5 C)  TempSrc: Temporal  SpO2: 97%  Weight: 275 lb (124.7 kg)  Height: 5\' 9"  (1.753 m)     Body mass index is 40.61 kg/m.  Physical Exam:   Physical Exam Vitals and nursing note reviewed.  Constitutional:      General: She is not in acute distress.    Appearance: She is well-developed. She is not ill-appearing or  toxic-appearing.  Cardiovascular:     Rate and Rhythm: Normal rate and regular rhythm.     Pulses: Normal pulses.     Heart sounds: Normal heart sounds, S1 normal and S2 normal.  Pulmonary:     Effort: Pulmonary effort is normal.     Breath sounds: Normal breath sounds.  Musculoskeletal:     Comments: Raised erythematous papules on trunk and inner thighs  Skin:    General: Skin is warm and dry.  Neurological:     Mental Status: She is alert.     GCS: GCS eye subscore is 4. GCS verbal subscore is 5. GCS motor subscore is 6.  Psychiatric:        Speech: Speech normal.        Behavior: Behavior normal. Behavior is cooperative.     Assessment and Plan:   Rash and nonspecific skin eruption Suspect possible drug reaction Continue to hold doxy and avoid in future 80 mg depomedrol injection provided today Start oral prednisone two week course tomorrow Start oral pepcid Continue daily zyrtec Take benadryl prn  Other migraine without status migrainosus, not intractable No red flags Refill 10 mg maxalt Follow-up as needed  Inda Coke, PA-C

## 2022-08-23 ENCOUNTER — Other Ambulatory Visit: Payer: Self-pay | Admitting: Physician Assistant

## 2022-09-18 ENCOUNTER — Telehealth (HOSPITAL_COMMUNITY): Payer: Self-pay

## 2022-09-18 MED ORDER — AMPHETAMINE-DEXTROAMPHETAMINE 5 MG PO TABS: 5.0000 mg | ORAL_TABLET | Freq: Every day | ORAL | 0 refills | Status: DC

## 2022-09-18 MED ORDER — AMPHETAMINE-DEXTROAMPHETAMINE 10 MG PO TABS: 10.0000 mg | ORAL_TABLET | Freq: Every day | ORAL | 0 refills | Status: DC

## 2022-09-18 NOTE — Telephone Encounter (Signed)
Patient needs refills on both Adderall 5mg  and 10mg  sent to [facility] in [place] Last refill 09/26 Next ov 12/20

## 2022-09-18 NOTE — Telephone Encounter (Signed)
Patient needed the [place] [facility] to [facility] [address] instead of [facility], sorry I miss understood her message.

## 2022-09-18 NOTE — Addendum Note (Signed)
Addended by: Merian Capron on: 09/18/2022 02:29 PM   Modules accepted: Orders

## 2022-10-11 ENCOUNTER — Telehealth (HOSPITAL_COMMUNITY): Payer: Self-pay | Admitting: *Deleted

## 2022-10-11 ENCOUNTER — Ambulatory Visit: Payer: 59 | Admitting: Family

## 2022-10-11 ENCOUNTER — Encounter: Payer: Self-pay | Admitting: Physician Assistant

## 2022-10-11 ENCOUNTER — Encounter: Payer: Self-pay | Admitting: Family

## 2022-10-11 VITALS — BP 117/87 | HR 115 | Temp 98.2°F | Ht 69.0 in | Wt 274.6 lb

## 2022-10-11 DIAGNOSIS — R053 Chronic cough: Secondary | ICD-10-CM

## 2022-10-11 MED ORDER — HYDROCOD POLI-CHLORPHE POLI ER 10-8 MG/5ML PO SUER: 5.0000 mL | Freq: Two times a day (BID) | ORAL | 0 refills | Status: DC | PRN

## 2022-10-11 NOTE — Patient Instructions (Signed)
It was very nice to see you today!   As discussed, take the Prednisone pills you have from September to help your cough. Do NOT take as it says on the bottle. I want you to take 3 pills = 30mg  tomorrow morning and then 20mg  = 2 pills for the next 4 days.  You can take up to 600mg  of Ibuprofen every 8 hours for chest pain related to your cough.  Continue using your inhalers daily and drink at least 2 liters of water every day.       PLEASE NOTE:  If you had any lab tests please let know if you have not heard back within a few days. You may see your results on MyChart before we have a chance to review them but we will give you a call once they are reviewed by . If we ordered any referrals today, please let know if you have not heard from their office within the next week.

## 2022-10-11 NOTE — Progress Notes (Addendum)
Patient ID: Miranda Hunter, female    DOB: 1986/08/20, 36 y.o.   MRN: [id number]  Chief Complaint  Patient presents with   productive cough    Pt states she stated feeling bad on Monday, started with sore throat now its just a dry cough and painful in chest when pt coughs.     HPI:      Pleuritic cough:  sx started Monday with sinus sx and sore throat but has gone down in her chest and she is coughing constantly. Pt has hx of asthma, reports using her inhalers as directed qd. Taking Ibuprofen which helps some with chest pain. Denies headache, fever, or nausea.      Assessment & Plan:  1. Cough, persistent - sending refill of Tussionex, reminded pt on use, also advised to take the Prednisone pack she has from September that she never took for a rash. Advised her to take 30mg  tomorrow am, then 20mg  x 4d and then stop. Continue inhalers as directed, drink at least 2L water qd.  - chlorpheniramine-HYDROcodone (TUSSIONEX) 10-8 MG/5ML; Take 5 mLs by mouth every 12 (twelve) hours as needed for cough.  Dispense: 115 mL; Refill: 0  Subjective:    Outpatient Medications Prior to Visit  Medication Sig Dispense Refill   albuterol (VENTOLIN HFA) 108 (90 Base) MCG/ACT inhaler Inhale 2 puffs into the lungs every 6 (six) hours as needed for wheezing or shortness of breath. 18 g 2   amphetamine-dextroamphetamine (ADDERALL) 10 MG tablet Take 1 tablet (10 mg total) by mouth daily. 30 tablet 0   amphetamine-dextroamphetamine (ADDERALL) 5 MG tablet Take 1 tablet (5 mg total) by mouth daily. This is in addition to 10mg  . Take 10mg  in the am and this 5mg  at noon 30 tablet 0   augmented betamethasone dipropionate (DIPROLENE-AF) 0.05 % ointment Apply topically.     cetirizine (ZYRTEC) 10 MG tablet Take 10 mg by mouth daily.     Clindamycin-Benzoyl Per, Refr, gel Apply 1 application topically daily.     escitalopram (LEXAPRO) 20 MG tablet TAKE 1 TABLET BY MOUTH EVERY DAY 90 tablet 1   fluticasone (FLONASE) 50  MCG/ACT nasal spray      ipratropium (ATROVENT) 0.06 % nasal spray Place 2 sprays into both nostrils 4 (four) times daily. 15 mL 12   Multiple Vitamin (MULTIVITAMIN) tablet Take 1 tablet by mouth daily.     mupirocin ointment (BACTROBAN) 2 % SMARTSIG:1 Application Topical 2-3 Times Daily     ondansetron (ZOFRAN-ODT) 4 MG disintegrating tablet TAKE 1-2 TABLETS BY MOUTH EVERY 8 HOURS AS NEEDED FOR NAUSEA. 30 tablet 1   rizatriptan (MAXALT-MLT) 10 MG disintegrating tablet Take 1 tablet (10 mg total) by mouth as needed for migraine. May repeat in 2 hours if needed 9 tablet 11   SLYND 4 MG TABS Take 1 tablet by mouth daily.     traZODone (DESYREL) 100 MG tablet Take 1 tablet (100 mg total) by mouth at bedtime. 90 tablet 1   predniSONE (DELTASONE) 10 MG tablet Take two tablets daily x 1 week and then one tablet daily x 1 week. 21 tablet 0   No facility-administered medications prior to visit.   Past Medical History:  Diagnosis Date   Anxiety    Asthma    History of chicken pox    IBS (irritable bowel syndrome)    Migraines    Past Surgical History:  Procedure Laterality Date   tonsillectomy and adnoidectomy Bilateral 2007   Allergies  Allergen Reactions  Amoxicillin-Pot Clavulanate Diarrhea      Objective:    Physical Exam Vitals and nursing note reviewed.  Constitutional:      Appearance: Normal appearance.  HENT:     Right Ear: Tympanic membrane and ear canal normal.     Left Ear: Tympanic membrane and ear canal normal.     Mouth/Throat:     Mouth: Mucous membranes are moist.     Palate: No lesions.     Pharynx: Pharyngeal swelling (small, approx. 1.5cm diameter swollen patch on upper left pharynx, pt denies any pain, no drainage), posterior oropharyngeal erythema and uvula swelling (deviated to the right) present. No oropharyngeal exudate.  Cardiovascular:     Rate and Rhythm: Normal rate and regular rhythm.  Pulmonary:     Effort: Pulmonary effort is normal.     Breath  sounds: Normal breath sounds.  Musculoskeletal:        General: Normal range of motion.  Skin:    General: Skin is warm and dry.  Neurological:     Mental Status: She is alert.  Psychiatric:        Mood and Affect: Mood normal.        Behavior: Behavior normal.    BP 117/87 (BP Location: Left Arm, Patient Position: Sitting)   Pulse (!) 115   Temp 98.2 F (36.8 C) (Temporal)   Ht 5\' 9"  (1.753 m)   Wt 274 lb 9.6 oz (124.6 kg)   LMP 10/11/2022 (Exact Date)   SpO2 96%   BMI 40.55 kg/m  Wt Readings from Last 3 Encounters:  10/11/22 274 lb 9.6 oz (124.6 kg)  08/22/22 275 lb (124.7 kg)  08/13/22 271 lb (122.9 kg)       08/15/22, NP

## 2022-10-11 NOTE — Telephone Encounter (Signed)
REFILL REQUEST -- amphetamine-dextroamphetamine (ADDERALL) 5 & 10 MG tablet   SEND REFILL --  [facility] [address], [place], [place] [address]

## 2022-10-12 MED ORDER — AMPHETAMINE-DEXTROAMPHETAMINE 10 MG PO TABS: 10.0000 mg | ORAL_TABLET | Freq: Every day | ORAL | 0 refills | Status: DC

## 2022-10-12 MED ORDER — AMPHETAMINE-DEXTROAMPHETAMINE 5 MG PO TABS: 5.0000 mg | ORAL_TABLET | Freq: Every day | ORAL | 0 refills | Status: DC

## 2022-10-12 NOTE — Addendum Note (Signed)
Addended by: [name] on: 10/12/2022 09:50 AM   Modules accepted: Orders

## 2022-10-13 ENCOUNTER — Encounter: Payer: Self-pay | Admitting: Family

## 2022-10-13 DIAGNOSIS — R053 Chronic cough: Secondary | ICD-10-CM

## 2022-10-15 ENCOUNTER — Telehealth: Payer: Self-pay | Admitting: Physician Assistant

## 2022-10-15 NOTE — Telephone Encounter (Signed)
Pt states she is worsening. Please advise   Patient Name: Miranda Hunter Gender: Female DOB: 21-May-1986 Age: 36 Y 12 D Return Phone Number: [phone] (Primary) Address: City/ State/ Zip:  [place]  [address] Client [facility] - Human resources [facility] [employer] Provider Jarold Motto- [place] Contact Type Call Who Is Calling Patient / Member / Family / Caregiver Call Type Triage / Clinical Relationship To Patient Self Return Phone Number [phone] (Primary) Chief Complaint Cough Reason for Call Symptomatic / Request for Health Information Initial Comment Caller states was in Thurs for appt; began on steroids; thinks at the point needs abx; having green discharge around eyes; and coughing up green; Translation No Nurse Assessment Nurse: Schreffler, RN, Cala Bradford Date/Time (Eastern Time): 10/13/2022 1:19:10 PM Confirm and document reason for call. If symptomatic, describe symptoms. ---Caller states was seen on Thurs and started on steroids. states green phlegm with coughing and green discharge from eyes. Denies fever, CP, SOB. Does the patient have any new or worsening symptoms? ---Yes Will a triage be completed? ---Yes Related visit to physician within the last 2 weeks? ---Yes Does the PT have any chronic conditions? (i.e. diabetes, asthma, this includes High risk factors for pregnancy, etc.) ---Yes List chronic conditions. ---asthma Is the patient pregnant or possibly pregnant? (Ask all females between the ages of 1-55) ---No Is this a behavioral health or substance abuse call? ---No Guidelines Guideline Title Affirmed Question Affirmed Notes Nurse Date/Time (Eastern Time) Eye - Pus or Discharge [1] Eye with yellow or green discharge, or eyelashes stick together AND [2] NO Schreffler, RN, Cala Bradford 10/13/2022 1:26:05 PM Guidelines Guideline Title Affirmed Question Affirmed Notes Nurse  Date/Time Lamount Cohen Time) PCP standing order to call in antibiotic eye drops (Exception: [place]; continue triage.) Disp. Time Lamount Cohen Time) Disposition Final User 10/13/2022 1:28:52 PM Call PCP within 24 Hours Yes Schreffler, RN, Cala Bradford Final Disposition 10/13/2022 1:28:52 PM Call PCP within 24 Hours Yes Schreffler, RN, Anders Simmonds Disagree/Comply Comply Caller Understands Yes PreDisposition Call Doctor Care Advice Given Per Guideline * IF OFFICE WILL BE CLOSED: I'll page the on-call provider now. EXCEPTION: from 9 pm to 9 am. Since this isn't urgent, we'll hold the page until morning. CALL PCP WITHIN 24 HOURS: CARE ADVICE given per Eye - Pus or Discharge (Adult) guideline. * You become worse CALL BACK IF: Comments User: Morrie Sheldon, RN Date/Time (Eastern Time): 10/13/2022 1:29:39 PM Caller advised to go to [facility]. Caller stated that she will wait till Monday to call [facility]. Referrals REFERRED TO PCP OFFICE

## 2022-10-15 NOTE — Telephone Encounter (Signed)
Please see message and advise. Pt saw you last Thursday.

## 2022-10-16 MED ORDER — AZITHROMYCIN 250 MG PO TABS: ORAL_TABLET | ORAL | 0 refills | Status: AC

## 2022-10-16 NOTE — Telephone Encounter (Signed)
Message sent to [facility], she saw pt last Thursday.

## 2022-11-08 ENCOUNTER — Telehealth (HOSPITAL_COMMUNITY): Payer: Self-pay | Admitting: *Deleted

## 2022-11-08 MED ORDER — AMPHETAMINE-DEXTROAMPHETAMINE 5 MG PO TABS: 5.0000 mg | ORAL_TABLET | Freq: Every day | ORAL | 0 refills | Status: DC

## 2022-11-08 MED ORDER — AMPHETAMINE-DEXTROAMPHETAMINE 10 MG PO TABS: 10.0000 mg | ORAL_TABLET | Freq: Every day | ORAL | 0 refills | Status: DC

## 2022-11-08 NOTE — Addendum Note (Signed)
Addended by: [name] on: 11/08/2022 11:14 AM   Modules accepted: Orders

## 2022-11-08 NOTE — Telephone Encounter (Signed)
Patient called requested ::  amphetamine-dextroamphetamine amphetamine-dextroamphetamine (ADDERALL) 5 & 10 MG tablet [id number] [place], [place] - [address]   Last Appt :: 10/11/22 Next Appt :: 11/14/22

## 2022-11-09 ENCOUNTER — Other Ambulatory Visit: Payer: Self-pay | Admitting: Physician Assistant

## 2022-11-12 ENCOUNTER — Other Ambulatory Visit: Payer: Self-pay | Admitting: Physician Assistant

## 2022-11-12 ENCOUNTER — Encounter: Payer: Self-pay | Admitting: Physician Assistant

## 2022-11-12 ENCOUNTER — Ambulatory Visit: Payer: 59 | Admitting: Physician Assistant

## 2022-11-12 VITALS — BP 130/80 | HR 117 | Temp 97.8°F | Ht 69.0 in | Wt 275.4 lb

## 2022-11-12 DIAGNOSIS — Z23 Encounter for immunization: Secondary | ICD-10-CM

## 2022-11-12 DIAGNOSIS — E669 Obesity, unspecified: Secondary | ICD-10-CM

## 2022-11-12 DIAGNOSIS — G4709 Other insomnia: Secondary | ICD-10-CM | POA: Diagnosis not present

## 2022-11-12 DIAGNOSIS — F411 Generalized anxiety disorder: Secondary | ICD-10-CM

## 2022-11-12 DIAGNOSIS — E88819 Insulin resistance, unspecified: Secondary | ICD-10-CM | POA: Diagnosis not present

## 2022-11-12 LAB — COMPREHENSIVE METABOLIC PANEL
ALT: 34 U/L (ref 0–35)
AST: 25 U/L (ref 0–37)
Albumin: 4.5 g/dL (ref 3.5–5.2)
Alkaline Phosphatase: 93 U/L (ref 39–117)
BUN: 7 mg/dL (ref 6–23)
CO2: 23 mEq/L (ref 19–32)
Calcium: 9.7 mg/dL (ref 8.4–10.5)
Chloride: 101 mEq/L (ref 96–112)
Creatinine, Ser: 0.71 mg/dL (ref 0.40–1.20)
GFR: 111.17 mL/min (ref >60.00)
Glucose, Bld: 125 mg/dL — ABNORMAL HIGH (ref 70–99)
Potassium: 4.1 mEq/L (ref 3.5–5.1)
Sodium: 136 mEq/L (ref 135–145)
Total Bilirubin: 0.6 mg/dL (ref 0.2–1.2)
Total Protein: 6.9 g/dL (ref 6.0–8.3)

## 2022-11-12 LAB — CBC WITH DIFFERENTIAL/PLATELET
Basophils Absolute: 0.1 10*3/uL (ref 0.0–0.1)
Basophils Relative: 0.5 % (ref 0.0–3.0)
Eosinophils Absolute: 0.1 10*3/uL (ref 0.0–0.7)
Eosinophils Relative: 1.1 % (ref 0.0–5.0)
HCT: 40 % (ref 36.0–46.0)
Hemoglobin: 13.4 g/dL (ref 12.0–15.0)
Lymphocytes Relative: 32.3 % (ref 12.0–46.0)
Lymphs Abs: 3.3 10*3/uL (ref 0.7–4.0)
MCHC: 33.5 g/dL (ref 30.0–36.0)
MCV: 87 fl (ref 78.0–100.0)
Monocytes Absolute: 0.7 10*3/uL (ref 0.1–1.0)
Monocytes Relative: 6.8 % (ref 3.0–12.0)
Neutro Abs: 6.1 10*3/uL (ref 1.4–7.7)
Neutrophils Relative %: 59.3 % (ref 43.0–77.0)
Platelets: 481 10*3/uL — ABNORMAL HIGH (ref 150.0–400.0)
RBC: 4.6 Mil/uL (ref 3.87–5.11)
RDW: 13.7 % (ref 11.5–15.5)
WBC: 10.2 10*3/uL (ref 4.0–10.5)

## 2022-11-12 LAB — LIPID PANEL
Cholesterol: 167 mg/dL (ref 0–200)
HDL: 40 mg/dL (ref >39.00)
NonHDL: 126.55
Total CHOL/HDL Ratio: 4
Triglycerides: 347 mg/dL — ABNORMAL HIGH (ref 0.0–149.0)
VLDL: 69.4 mg/dL — ABNORMAL HIGH (ref 0.0–40.0)

## 2022-11-12 LAB — POCT GLYCOSYLATED HEMOGLOBIN (HGB A1C): Hemoglobin A1C: 5.9 % — AB (ref 4.0–5.6)

## 2022-11-12 LAB — LDL CHOLESTEROL, DIRECT: Direct LDL: 92 mg/dL

## 2022-11-12 MED ORDER — ZEPBOUND 2.5 MG/0.5ML ~~LOC~~ SOAJ: 2.5000 mg | SUBCUTANEOUS | 0 refills | Status: DC

## 2022-11-12 NOTE — Progress Notes (Addendum)
Miranda Hunter is a 36 y.o. female here for a new problem.  History of Present Illness:   Chief Complaint  Patient presents with   Obesity    Discuss weight loss options.    HPI  Obesity: She has been to the healthy weight management clinic for weight loss, but she notes they did not help as much. She notes she has been eating well and has been staying physically active, but she is still struggling with weight loss. She tried Saxenda years ago and did not feel like this was helpful for her.  Anxiety/Insomnia: She complains that her anxiety has worsen since our last visit due to work at home. She is currently taking Lexapro 20 mg and Trazodone 100 mg. Patient notes that her anxiety is affecting her sleep, but denies sleep apnea.  Denies SI/HI.  Pre-diabetic: Her blood sugar has been near pre-diabetic range, will check her blood sugar during this visit.  Lab Results  Component Value Date   HGBA1C 5.9 (A) 11/12/2022   Immunization: She will receive the influenza vaccine during this visit.   Social History: She denies family history of thyroid cancer.   Past Medical History:  Diagnosis Date   Anxiety    Asthma    History of chicken pox    IBS (irritable bowel syndrome)    Migraines      Social History   Tobacco Use   Smoking status: Never   Smokeless tobacco: Never  Vaping Use   Vaping Use: Never used  Substance Use Topics   Alcohol use: Yes    Comment: socially   Drug use: No    Past Surgical History:  Procedure Laterality Date   tonsillectomy and adnoidectomy Bilateral 2007    Family History  Problem Relation Age of Onset   Migraines Mother    Prostate cancer Father    Hypertension Father    Asthma Sister    Lung cancer Maternal Grandmother        heavy smoker   Migraines Maternal Grandmother    Lung cancer Maternal Grandfather        heavy smoker   Asthma Sister     Allergies  Allergen Reactions   Amoxicillin-Pot Clavulanate Diarrhea    Current  Medications:   Current Outpatient Medications:    albuterol (VENTOLIN HFA) 108 (90 Base) MCG/ACT inhaler, Inhale 2 puffs into the lungs every 6 (six) hours as needed for wheezing or shortness of breath., Disp: 18 g, Rfl: 2   amphetamine-dextroamphetamine (ADDERALL) 10 MG tablet, Take 1 tablet (10 mg total) by mouth daily., Disp: 30 tablet, Rfl: 0   amphetamine-dextroamphetamine (ADDERALL) 5 MG tablet, Take 1 tablet (5 mg total) by mouth daily. This is in addition to 10mg  . Take 10mg  in the am and this 5mg  at noon, Disp: 30 tablet, Rfl: 0   cetirizine (ZYRTEC) 10 MG tablet, Take 10 mg by mouth daily., Disp: , Rfl:    Clindamycin-Benzoyl Per, Refr, gel, Apply 1 application topically daily., Disp: , Rfl:    escitalopram (LEXAPRO) 20 MG tablet, TAKE 1 TABLET BY MOUTH EVERY DAY, Disp: 90 tablet, Rfl: 1   fluticasone (FLONASE) 50 MCG/ACT nasal spray, , Disp: , Rfl:    Multiple Vitamin (MULTIVITAMIN) tablet, Take 1 tablet by mouth daily., Disp: , Rfl:    ondansetron (ZOFRAN-ODT) 4 MG disintegrating tablet, TAKE 1-2 TABLETS BY MOUTH EVERY 8 HOURS AS NEEDED FOR NAUSEA., Disp: 30 tablet, Rfl: 1   rizatriptan (MAXALT-MLT) 10 MG disintegrating tablet, Take 1  tablet (10 mg total) by mouth as needed for migraine. May repeat in 2 hours if needed, Disp: 9 tablet, Rfl: 11   SLYND 4 MG TABS, Take 1 tablet by mouth daily., Disp: , Rfl:    traZODone (DESYREL) 100 MG tablet, Take 1 tablet (100 mg total) by mouth at bedtime., Disp: 90 tablet, Rfl: 1   Review of Systems:   Review of Systems  Constitutional:  Negative for chills, fever, malaise/fatigue and weight loss.  HENT:  Negative for hearing loss, sinus pain and sore throat.   Respiratory:  Negative for cough and hemoptysis.   Cardiovascular:  Negative for chest pain, palpitations, orthopnea, leg swelling and PND.  Gastrointestinal:  Negative for abdominal pain, constipation, diarrhea, heartburn, nausea and vomiting.  Genitourinary:  Negative for dysuria,  frequency and urgency.  Musculoskeletal:  Negative for back pain, myalgias and neck pain.  Skin:  Negative for itching and rash.  Endo/Heme/Allergies:  Negative for polydipsia.  Psychiatric/Behavioral:  Negative for depression. The patient is not nervous/anxious.     Vitals:   Vitals:   11/12/22 1057  BP: 130/80  Pulse: (!) 117  Temp: 97.8 F (36.6 C)  TempSrc: Temporal  SpO2: 96%  Weight: 275 lb 6.1 oz (124.9 kg)  Height: 5\' 9"  (1.753 m)     Body mass index is 40.67 kg/m.  Physical Exam:   Physical Exam Constitutional:      Appearance: Normal appearance. She is well-developed.  HENT:     Head: Normocephalic and atraumatic.  Eyes:     General: Lids are normal.     Extraocular Movements: Extraocular movements intact.     Conjunctiva/sclera: Conjunctivae normal.  Pulmonary:     Effort: Pulmonary effort is normal.  Musculoskeletal:        General: Normal range of motion.     Cervical back: Normal range of motion and neck supple.  Skin:    General: Skin is warm and dry.  Neurological:     Mental Status: She is alert and oriented to person, place, and time.  Psychiatric:        Attention and Perception: Attention and perception normal.        Mood and Affect: Mood normal.        Behavior: Behavior normal.        Thought Content: Thought content normal.        Judgment: Judgment normal.     Assessment and Plan:   Obesity (BMI 30-39.9) Uncontrolled Will trial Zepbound 2.5 mg weekly Side effects/ricks/benefits discussed Follow up via Mychart in 2-3 weeks after starting to let know how to proceed with dosage/refill Follow-up for visit in 3 months  Insulin resistance Updated A1c under good control Will start Zepbound which will help prevent worsening blood sugar control  Other insomnia Uncontrolled She is going to follow-up with her psychiatrist later this week - I advised her to discuss with him - consider increase of trazodone to 150 mg  GAD Overall  controlled Continue lexapro 20 mg daily Follow-up if new/worsening  I,Param Shah,acting as a scribe for [place], PA.,have documented all relevant documentation on the behalf of Kynsley Whitehouse, PA,as directed by  Jarold Motto, PA while in the presence of Jarold Motto, Jarold Motto.  I, [place], Jarold Motto, have reviewed all documentation for this visit. The documentation on 11/12/22 for the exam, diagnosis, procedures, and orders are all accurate and complete.  11/14/22, PA-C

## 2022-11-12 NOTE — Patient Instructions (Signed)
It was great to see you!  I will send in Zepbound for you to trial Message me a few weeks into this so I know if its working for you  Discuss your sleep with psychiatry  Take care,  Jarold Motto PA-C

## 2022-11-14 ENCOUNTER — Telehealth (HOSPITAL_COMMUNITY): Payer: 59 | Admitting: Psychiatry

## 2022-11-14 ENCOUNTER — Encounter (HOSPITAL_COMMUNITY): Payer: Self-pay | Admitting: Psychiatry

## 2022-11-14 DIAGNOSIS — F411 Generalized anxiety disorder: Secondary | ICD-10-CM | POA: Diagnosis not present

## 2022-11-14 DIAGNOSIS — F900 Attention-deficit hyperactivity disorder, predominantly inattentive type: Secondary | ICD-10-CM | POA: Diagnosis not present

## 2022-11-14 DIAGNOSIS — F5102 Adjustment insomnia: Secondary | ICD-10-CM

## 2022-11-14 NOTE — Telephone Encounter (Signed)
Received response from [employer] / [employer]: The requested medication and/or diagnosis are not a covered benefit and are excluded from coverage in accordance with the terms and conditions of your plan benefit. Therefore, this request has been administratively denied.

## 2022-11-14 NOTE — Progress Notes (Signed)
[facility] Follow up visit  Patient Identification: Miranda Hunter MRN:  [id number] Date of Evaluation:  11/14/2022  Chief complaint: follow up adhd Referral Source: primary care Visit Diagnosis:    ICD-10-CM   1. GAD (generalized anxiety disorder)  F41.1     2. Adjustment insomnia  F51.02     3. Attention deficit hyperactivity disorder (ADHD), predominantly inattentive type  F90.0      Virtual Visit via Video Note  I connected with Miranda Hunter on 11/14/22 at 10:15 AM EST by a video enabled telemedicine application and verified that I am speaking with the correct person using two identifiers.  Location: Patient: home Provider:home office   I discussed the limitations of evaluation and management by telemedicine and the availability of in person appointments. The patient expressed understanding and agreed to proceed.      I discussed the assessment and treatment plan with the patient. The patient was provided an opportunity to ask questions and all were answered. The patient agreed with the plan and demonstrated an understanding of the instructions.   The patient was advised to call back or seek an in-person evaluation if the symptoms worsen or if the condition fails to improve as anticipated.  I provided  15 minutes  of non-face-to-face time during this encounter including chart review and documenation         History of Present Illness: 36 years old single White female referred by PCP for management of inattention.  Doing fair in regard to anxiety and inattention Some sleep concerns and anxiety during the night Also may be started with anti diabetic med to help with weight as well  Aggravating factors; history of ADHD.  Grandparent death Modifying factors; her dog, job , family   Duration ADHD since young age;  Severity fair but sleep poor at times        Past Psychiatric History: anxiety, inattention  Previous Psychotropic Medications: Yes   Substance  Abuse History in the last 12 months:  No.  Consequences of Substance Abuse: NA  Past Medical History:  Past Medical History:  Diagnosis Date   Anxiety    Asthma    History of chicken pox    IBS (irritable bowel syndrome)    Migraines     Past Surgical History:  Procedure Laterality Date   tonsillectomy and adnoidectomy Bilateral 2007    Family Psychiatric History: denies  Family History:  Family History  Problem Relation Age of Onset   Migraines Mother    Prostate cancer Father    Hypertension Father    Asthma Sister    Lung cancer Maternal Grandmother        heavy smoker   Migraines Maternal Grandmother    Lung cancer Maternal Grandfather        heavy smoker   Asthma Sister     Social History:   Social History   Socioeconomic History   Marital status: Single    Spouse name: Not on file   Number of children: 0   Years of education: Not on file   Highest education level: Bachelor's degree (e.g., BA, AB, BS)  Occupational History   Not on file  Tobacco Use   Smoking status: Never   Smokeless tobacco: Never  Vaping Use   Vaping Use: Never used  Substance and Sexual Activity   Alcohol use: Yes    Comment: socially   Drug use: No   Sexual activity: Not Currently    Comment: kyleena  Other Topics Concern  Not on file  Social History Narrative   She is a Psychiatric nurseSam"   Recently got a new puppy- a Advertising account planner named [name]   Caffeine once a week      Social Determinants of Corporate investment banker Strain: Not on file  Food Insecurity: Not on file  Transportation Needs: Not on file  Physical Activity: Not on file  Stress: Not on file  Social Connections: Not on file     Allergies:   Allergies  Allergen Reactions   Amoxicillin-Pot Clavulanate Diarrhea    Metabolic Disorder Labs: Lab Results  Component Value Date   HGBA1C 5.9 (A) 11/12/2022   No results found for: "PROLACTIN" Lab Results  Component Value Date   CHOL 167  11/12/2022   TRIG 347.0 (H) 11/12/2022   HDL 40.00 11/12/2022   CHOLHDL 4 11/12/2022   VLDL 69.4 (H) 11/12/2022   LDLCALC 129 (H) 08/24/2021   LDLCALC 104 (H) 02/08/2021   Lab Results  Component Value Date   TSH 1.260 05/04/2020    Therapeutic Level Labs: No results found for: "LITHIUM" No results found for: "CBMZ" No results found for: "VALPROATE"  Current Medications: Current Outpatient Medications  Medication Sig Dispense Refill   albuterol (VENTOLIN HFA) 108 (90 Base) MCG/ACT inhaler Inhale 2 puffs into the lungs every 6 (six) hours as needed for wheezing or shortness of breath. 18 g 2   amphetamine-dextroamphetamine (ADDERALL) 10 MG tablet Take 1 tablet (10 mg total) by mouth daily. 30 tablet 0   amphetamine-dextroamphetamine (ADDERALL) 5 MG tablet Take 1 tablet (5 mg total) by mouth daily. This is in addition to 10mg  . Take 10mg  in the am and this 5mg  at noon 30 tablet 0   cetirizine (ZYRTEC) 10 MG tablet Take 10 mg by mouth daily.     Clindamycin-Benzoyl Per, Refr, gel Apply 1 application topically daily.     escitalopram (LEXAPRO) 20 MG tablet TAKE 1 TABLET BY MOUTH EVERY DAY 90 tablet 1   fluticasone (FLONASE) 50 MCG/ACT nasal spray      Multiple Vitamin (MULTIVITAMIN) tablet Take 1 tablet by mouth daily.     ondansetron (ZOFRAN-ODT) 4 MG disintegrating tablet TAKE 1-2 TABLETS BY MOUTH EVERY 8 HOURS AS NEEDED FOR NAUSEA. 30 tablet 1   rizatriptan (MAXALT-MLT) 10 MG disintegrating tablet Take 1 tablet (10 mg total) by mouth as needed for migraine. May repeat in 2 hours if needed 9 tablet 11   SLYND 4 MG TABS Take 1 tablet by mouth daily.     Tirzepatide-Weight Management (ZEPBOUND) 2.5 MG/0.5ML SOAJ Inject 2.5 mg into the skin once a week. 2 mL 0   traZODone (DESYREL) 100 MG tablet Take 1 tablet (100 mg total) by mouth at bedtime. 90 tablet 1   No current facility-administered medications for this visit.      Psychiatric Specialty Exam: Review of Systems   Cardiovascular:  Negative for chest pain.  Neurological:  Negative for weakness.  Psychiatric/Behavioral:  Positive for sleep disturbance. Negative for agitation and decreased concentration.     Last menstrual period 11/08/2022.There is no height or weight on file to calculate BMI.  General Appearance: Casual  Eye Contact:  Fair  Speech:  Slow  Volume:  Normal  Mood:fair  Affect:  Congruent  Thought Process:  Goal Directed  Orientation:  Full (Time, Place, and Person)  Thought Content:  Logical  Suicidal Thoughts:  No  Homicidal Thoughts:  No  Memory:  Immediate;   Fair Recent;  Fair  Judgement:  Fair  Insight:  Fair  Psychomotor Activity:  Normal  Concentration:  Concentration: Fair and Attention Span: variable  Recall:  Good  Fund of Knowledge:Good  Language: Good  Akathisia:  No  Handed:   AIMS (if indicated):  not done  Assets:  Desire for Improvement Housing  ADL's:  Intact  Cognition: WNL  Sleep:  Fair   Screenings: PHQ2-9    Flowsheet Row Video Visit from 08/13/2022 in [facility] from 12/03/2018 in [facility] Office Visit from 09[facility]03/2017 in [facility]  PHQ-2 Total Score 0 5 0  PHQ-9 Total Score -- 13 --      Flowsheet Row [facility]22[facility]2022 [facility] [place] Video Visit from 02/16/2022 in [facility] [place] Video Visit from 11/10/2021 in [facility] [place]  C-SSRS RISK CATEGORY No Risk No Risk No Risk       Assessment and Plan: as follows  Prior documentation reviewed    ADHD manageable continue adderall 10 plus 5mg  No headachs or chest pain  Generalized anxiety disorder; fair except for at night at times, continue lexapro may shift to night time if needed   Insomnia: at times difficult to sleep, discussed sleep hygiene, take adderall more so early  morning Avoid coffee, is on trazadone 100mg , should consider sleep study and review effect of sleep hygiene  Fu 3 -60m.   , MD 12/20/202310:20 AM

## 2022-11-14 NOTE — Telephone Encounter (Signed)
PA needed for Zepbound 2.5 mg, done thru Covermymeds, awaiting determination. KEY: [id number]

## 2022-12-10 ENCOUNTER — Telehealth (HOSPITAL_COMMUNITY): Payer: Self-pay

## 2022-12-10 ENCOUNTER — Encounter: Payer: Self-pay | Admitting: Physician Assistant

## 2022-12-10 MED ORDER — AMPHETAMINE-DEXTROAMPHETAMINE 5 MG PO TABS: 5.0000 mg | ORAL_TABLET | Freq: Every day | ORAL | 0 refills | Status: DC

## 2022-12-10 MED ORDER — AMPHETAMINE-DEXTROAMPHETAMINE 10 MG PO TABS: 10.0000 mg | ORAL_TABLET | Freq: Every day | ORAL | 0 refills | Status: DC

## 2022-12-10 NOTE — Telephone Encounter (Signed)
Patient needs a refill on 5mg  and 10mg  [facility] [place] [place] Last refill 12/14 Next appt 04/15

## 2022-12-11 NOTE — Telephone Encounter (Signed)
Pt would like to know if she can increase Trazodone another 50 mg at night when needed?

## 2022-12-19 MED ORDER — TRAZODONE HCL 100 MG PO TABS: 150.0000 mg | ORAL_TABLET | Freq: Every day | ORAL | 1 refills | Status: DC

## 2022-12-28 ENCOUNTER — Encounter: Payer: Self-pay | Admitting: Physician Assistant

## 2022-12-31 NOTE — Telephone Encounter (Signed)
Miranda Hunter, does pt have any other weight loss options?

## 2023-01-02 ENCOUNTER — Other Ambulatory Visit: Payer: Self-pay | Admitting: Physician Assistant

## 2023-01-02 NOTE — Telephone Encounter (Signed)
Please see message and advise 

## 2023-01-04 ENCOUNTER — Telehealth (HOSPITAL_COMMUNITY): Payer: Self-pay | Admitting: Psychiatry

## 2023-01-04 MED ORDER — AMPHETAMINE-DEXTROAMPHETAMINE 10 MG PO TABS: 10.0000 mg | ORAL_TABLET | Freq: Every day | ORAL | 0 refills | Status: DC

## 2023-01-04 NOTE — Telephone Encounter (Signed)
Pt needs refill on 53m and 171madderall   [facility] [address]  Next Visit -4/15 Last Visit -12/20

## 2023-01-05 ENCOUNTER — Other Ambulatory Visit: Payer: Self-pay | Admitting: Physician Assistant

## 2023-01-05 DIAGNOSIS — F3289 Other specified depressive episodes: Secondary | ICD-10-CM

## 2023-01-07 MED ORDER — AMPHETAMINE-DEXTROAMPHETAMINE 10 MG PO TABS: 10.0000 mg | ORAL_TABLET | Freq: Every day | ORAL | 0 refills | Status: DC

## 2023-01-07 MED ORDER — AMPHETAMINE-DEXTROAMPHETAMINE 5 MG PO TABS: 5.0000 mg | ORAL_TABLET | Freq: Every day | ORAL | 0 refills | Status: DC

## 2023-01-07 NOTE — Addendum Note (Signed)
Addended by: Merian Capron on: 01/07/2023 10:30 AM   Modules accepted: Orders

## 2023-01-07 NOTE — Telephone Encounter (Signed)
Pharmacy received the 52m adderall.   Patient also needs 524mcalled in.

## 2023-01-09 MED ORDER — TRAZODONE HCL 50 MG PO TABS: 50.0000 mg | ORAL_TABLET | Freq: Every evening | ORAL | 0 refills | Status: DC | PRN

## 2023-01-09 NOTE — Telephone Encounter (Signed)
Spoke to pt told her I sent Rx for Trazodone 100 mg take 1.5 tablets at bedtime. Told pt just need to cut tablet in half when she needs extra. Pt said she received Rx that says one tablet by mouth. Told her they must of filled old Rx. Pt asked to send in 50 mg tablet. Told her will send Rx for Trazodone 50 mg to pharmacy. Pt verbalized understanding. Rx sent.

## 2023-01-31 ENCOUNTER — Other Ambulatory Visit: Payer: Self-pay | Admitting: Physician Assistant

## 2023-01-31 ENCOUNTER — Encounter: Payer: Self-pay | Admitting: Physician Assistant

## 2023-02-01 ENCOUNTER — Encounter: Payer: Self-pay | Admitting: Physician Assistant

## 2023-02-01 ENCOUNTER — Telehealth (INDEPENDENT_AMBULATORY_CARE_PROVIDER_SITE_OTHER): Payer: 59 | Admitting: Physician Assistant

## 2023-02-01 ENCOUNTER — Ambulatory Visit (INDEPENDENT_AMBULATORY_CARE_PROVIDER_SITE_OTHER): Payer: 59 | Admitting: *Deleted

## 2023-02-01 VITALS — Ht 69.0 in | Wt 275.0 lb

## 2023-02-01 DIAGNOSIS — G43109 Migraine with aura, not intractable, without status migrainosus: Secondary | ICD-10-CM | POA: Diagnosis not present

## 2023-02-01 MED ORDER — KETOROLAC TROMETHAMINE 60 MG/2ML IM SOLN
60.0000 mg | Freq: Once | INTRAMUSCULAR | Status: AC
  Administered 2023-02-01: 60 mg via INTRAMUSCULAR

## 2023-02-01 MED ORDER — UBRELVY 50 MG PO TABS: ORAL_TABLET | ORAL | 0 refills | Status: DC

## 2023-02-01 MED ORDER — PROMETHAZINE HCL 12.5 MG PO TABS: 12.5000 mg | ORAL_TABLET | Freq: Three times a day (TID) | ORAL | 0 refills | Status: DC | PRN

## 2023-02-01 MED ORDER — PROMETHAZINE HCL 25 MG/ML IJ SOLN
25.0000 mg | Freq: Once | INTRAMUSCULAR | Status: AC
  Administered 2023-02-01: 25 mg via INTRAMUSCULAR

## 2023-02-01 NOTE — Progress Notes (Signed)
I acted as a Education administrator for [employer], PA-C Anselmo Pickler, LPN  Virtual Visit via Video Note   I, Inda Coke, PA, connected with  Miranda Hunter  ([id number], 01/09/1986) on 02/01/23 at  8:20 AM EST by a video-enabled telemedicine application and verified that I am speaking with the correct person using two identifiers.  Location: Patient: Home Provider: [place] office   I discussed the limitations of evaluation and management by telemedicine and the availability of in person appointments. The patient expressed understanding and agreed to proceed.    History of Present Illness: Miranda Hunter is a 37 y.o. who identifies as a female who was assigned female at birth, and is being seen today for migraine.   Migraines Pt c/o increase in Migraines. This past episode started last Thurs as a cluster headache, pt is still having migraine, dizziness, nausea, vomited x 1 yesterday, sensitivity to light.  Pt has been taking Maxalt but is not helping. Has tried ibuprofen and is taking regularly.  Almost went to the ER due to intensity of symptoms last night. Denies new symptoms with this migraine that are atypical to past migraines  Denies: weakness on one side of the body, permanent vision changes, slurred speech  Has seen neuro in the past Most recently in 2021 -- saw Dr Jaynee Eagles at [facility]  Problems:  Patient Active Problem List   Diagnosis Date Noted   Vitamin D deficiency 11/09/2020   Attention deficit hyperactivity disorder (ADHD) 10/14/2020   Transaminitis 06/06/2020   Hyperlipidemia, mixed 06/06/2020   Other insomnia 06/06/2020   Cervical intraepithelial neoplasia grade 1 04/27/2020   Insulin resistance 10/01/2019   Depression 04/28/2019   Migraine with aura and without status migrainosus, not intractable 10/14/2018   Obesity (BMI 30-39.9) 10/14/2018   Moderate persistent asthma 09/16/2018    Allergies:  Allergies  Allergen Reactions   Amoxicillin-Pot  Clavulanate Diarrhea   Medications:  Current Outpatient Medications:    albuterol (VENTOLIN HFA) 108 (90 Base) MCG/ACT inhaler, Inhale 2 puffs into the lungs every 6 (six) hours as needed for wheezing or shortness of breath., Disp: 18 g, Rfl: 2   amphetamine-dextroamphetamine (ADDERALL) 10 MG tablet, Take 1 tablet (10 mg total) by mouth daily., Disp: 30 tablet, Rfl: 0   amphetamine-dextroamphetamine (ADDERALL) 5 MG tablet, Take 1 tablet (5 mg total) by mouth daily. This is in addition to '10mg'$  . Take '10mg'$  in the am and this '5mg'$  at noon, Disp: 30 tablet, Rfl: 0   cetirizine (ZYRTEC) 10 MG tablet, Take 10 mg by mouth daily., Disp: , Rfl:    Clindamycin-Benzoyl Per, Refr, gel, Apply 1 application topically daily., Disp: , Rfl:    escitalopram (LEXAPRO) 20 MG tablet, TAKE 1 TABLET BY MOUTH EVERY DAY, Disp: 30 tablet, Rfl: 2   fluticasone (FLONASE) 50 MCG/ACT nasal spray, , Disp: , Rfl:    Multiple Vitamin (MULTIVITAMIN) tablet, Take 1 tablet by mouth daily., Disp: , Rfl:    ondansetron (ZOFRAN-ODT) 4 MG disintegrating tablet, TAKE 1-2 TABLETS BY MOUTH EVERY 8 HOURS AS NEEDED FOR NAUSEA., Disp: 30 tablet, Rfl: 1   promethazine (PHENERGAN) 12.5 MG tablet, Take 1 tablet (12.5 mg total) by mouth every 8 (eight) hours as needed for nausea or vomiting., Disp: 20 tablet, Rfl: 0   rizatriptan (MAXALT-MLT) 10 MG disintegrating tablet, Take 1 tablet (10 mg total) by mouth as needed for migraine. May repeat in 2 hours if needed, Disp: 9 tablet, Rfl: 11   SLYND 4 MG TABS,  Take 1 tablet by mouth daily., Disp: , Rfl:    traZODone (DESYREL) 100 MG tablet, Take 1.5 tablets (150 mg total) by mouth at bedtime., Disp: 90 tablet, Rfl: 1   traZODone (DESYREL) 50 MG tablet, Take 1 tablet (50 mg total) by mouth at bedtime as needed for sleep., Disp: 90 tablet, Rfl: 0  Observations/Objective: Patient is well-developed, well-nourished in no acute distress.  Resting comfortably at home.  Head is normocephalic, atraumatic.   No labored breathing.  Speech is clear and coherent with logical content.  Patient is alert and oriented at baseline.   Assessment and Plan: 1. Migraine with aura and without status migrainosus, not intractable Uncontrolled Offered to send in oral prednisone burst with oral phenergan and a new abortive medication vs coming in for office visit for migraine treatment -- she has opted for the latter Will also provide sample of abortive medication and trial Roselyn Meier Will also re-submit her referral to neurology per her request If any worsening symptoms, she needs to go to the ER Currently has no red flag symptoms  Follow Up Instructions: I discussed the assessment and treatment plan with the patient. The patient was provided an opportunity to ask questions and all were answered. The patient agreed with the plan and demonstrated an understanding of the instructions.  A copy of instructions were sent to the patient via MyChart unless otherwise noted below.   The patient was advised to call back or seek an in-person evaluation if the symptoms worsen or if the condition fails to improve as anticipated.  Inda Coke, [place]

## 2023-02-01 NOTE — Addendum Note (Signed)
Addended by: Marian Sorrow on: 02/01/2023 11:00 AM   Modules accepted: Orders

## 2023-02-01 NOTE — Progress Notes (Addendum)
Per orders of Inda Coke, [place], injection of Ketoralc 60 mg given IM by Anselmo Pickler, LPN in right upper outer quadrant, also Phenergan 25 mg given IM left upper outer quadrant. Patient tolerated injections well.

## 2023-02-02 ENCOUNTER — Other Ambulatory Visit: Payer: Self-pay | Admitting: Physician Assistant

## 2023-02-04 ENCOUNTER — Telehealth (HOSPITAL_COMMUNITY): Payer: Self-pay

## 2023-02-04 ENCOUNTER — Telehealth: Payer: Self-pay | Admitting: Physician Assistant

## 2023-02-04 ENCOUNTER — Telehealth: Payer: Self-pay | Admitting: *Deleted

## 2023-02-04 MED ORDER — AMPHETAMINE-DEXTROAMPHETAMINE 5 MG PO TABS: 5.0000 mg | ORAL_TABLET | Freq: Every day | ORAL | 0 refills | Status: DC

## 2023-02-04 MED ORDER — TRAZODONE HCL 150 MG PO TABS: 150.0000 mg | ORAL_TABLET | Freq: Every day | ORAL | 1 refills | Status: DC

## 2023-02-04 MED ORDER — AMPHETAMINE-DEXTROAMPHETAMINE 10 MG PO TABS: 10.0000 mg | ORAL_TABLET | Freq: Every day | ORAL | 0 refills | Status: DC

## 2023-02-04 NOTE — Telephone Encounter (Signed)
Spoke to pt told her working on [employer] for [employer]. What have you tried? Pt said she has tried several medications, most recently Imitrex & Maxalt. Pt said she is scheduled to see Neurology 04/10/2023. Told her good, I will let [facility] know. Told pt as soon as I hear back from insurance I will let you know if approved or not. Pt verbalized understanding.

## 2023-02-04 NOTE — Telephone Encounter (Signed)
Spoke to pt said she needs a refill on Trazodone. Told her I sent Rx over for Trazodone 100 mg today. Asked pt if she would rather just have Trazodone 150 mg tablet at bedtime? Pt said that would be fine. Told her will send new Rx now. Pt verbalized understanding.

## 2023-02-04 NOTE — Telephone Encounter (Signed)
Patient requests callback from [facility] when able.

## 2023-02-04 NOTE — Telephone Encounter (Signed)
Medication refill - Message left from patient requesting new Adderall 5 mg and 10 mg orders, both last provided on 01/07/23 and patient returns next on 03/11/23. Pt. [facility] [address]

## 2023-02-04 NOTE — Telephone Encounter (Signed)
PA needed for Ubrevly 50 mg. PA done thru Covermymeds. Awaiting determination. KEY: [id number]

## 2023-02-04 NOTE — Telephone Encounter (Signed)
Left detailed message on personal voicemail Ubrevly 50 mg has been approved for 8 tablets per month, if you need more than that when you see Neurology they can take over the Rx. Pharmacy notified and are getting Rx ready for you. Any questions please call office.

## 2023-02-04 NOTE — Telephone Encounter (Signed)
Received fax from [employer] Ubrevly 50 mg 12 tablets has been denied, but pt is approved for 8 tablets per month through 02/04/2024.

## 2023-02-04 NOTE — Telephone Encounter (Signed)
Medication management - Message left for patient that Dr. De Nurse had sent in her requested new orders for Adderall 5 mg and 10 mg [facility] and requested pt call back if questions.

## 2023-02-04 NOTE — Telephone Encounter (Signed)
[facility] and spoke [facility] [place] told her Ubrevly 50 mg has been approved for 8 tablets per month till 02/04/2024. Kimber verbalized understanding.

## 2023-03-04 ENCOUNTER — Telehealth (HOSPITAL_COMMUNITY): Payer: Self-pay | Admitting: Psychiatry

## 2023-03-04 MED ORDER — AMPHETAMINE-DEXTROAMPHETAMINE 10 MG PO TABS: 10.0000 mg | ORAL_TABLET | Freq: Every day | ORAL | 0 refills | Status: DC

## 2023-03-04 MED ORDER — AMPHETAMINE-DEXTROAMPHETAMINE 5 MG PO TABS: 5.0000 mg | ORAL_TABLET | Freq: Every day | ORAL | 0 refills | Status: DC

## 2023-03-04 NOTE — Telephone Encounter (Signed)
Patient called requesting refills of:  amphetamine-dextroamphetamine (ADDERALL) 10 MG tablet   amphetamine-dextroamphetamine (ADDERALL) 5 MG tablet   [id number] [place], [place] - [address] (Ph: [phone])   Last ordered: 02/04/2023 - 30 tablets each  Last visit: 11/14/2022  Next visit: 03/11/2023

## 2023-03-04 NOTE — Telephone Encounter (Signed)
Medication management - Message left for patient that Dr. Gilmore Laroche had sent in her requested new [facility] [address] [address] [facility].

## 2023-03-04 NOTE — Addendum Note (Signed)
Addended by: [name] on: 03/04/2023 03:45 PM   Modules accepted: Orders

## 2023-03-11 ENCOUNTER — Telehealth (INDEPENDENT_AMBULATORY_CARE_PROVIDER_SITE_OTHER): Payer: 59 | Admitting: Psychiatry

## 2023-03-11 ENCOUNTER — Encounter (HOSPITAL_COMMUNITY): Payer: Self-pay | Admitting: Psychiatry

## 2023-03-11 DIAGNOSIS — F411 Generalized anxiety disorder: Secondary | ICD-10-CM | POA: Diagnosis not present

## 2023-03-11 DIAGNOSIS — F900 Attention-deficit hyperactivity disorder, predominantly inattentive type: Secondary | ICD-10-CM | POA: Diagnosis not present

## 2023-03-11 DIAGNOSIS — F5102 Adjustment insomnia: Secondary | ICD-10-CM

## 2023-03-11 NOTE — Progress Notes (Signed)
[facility] Follow up visit  Patient Identification: Miranda Hunter MRN:  [id number] Date of Evaluation:  03/11/2023  Chief complaint: follow up adhd Referral Source: primary care Visit Diagnosis:    ICD-10-CM   1. GAD (generalized anxiety disorder)  F41.1     2. Adjustment insomnia  F51.02     3. Attention deficit hyperactivity disorder (ADHD), predominantly inattentive type  F90.0      Virtual Visit via Video Note  I connected with Miranda Hunter on 03/11/23 at 10:00 AM EDT by a video enabled telemedicine application and verified that I am speaking with the correct person using two identifiers.  Location: Patient: home Provider: home office   I discussed the limitations of evaluation and management by telemedicine and the availability of in person appointments. The patient expressed understanding and agreed to proceed.    I discussed the assessment and treatment plan with the patient. The patient was provided an opportunity to ask questions and all were answered. The patient agreed with the plan and demonstrated an understanding of the instructions.   The patient was advised to call back or seek an in-person evaluation if the symptoms worsen or if the condition fails to improve as anticipated.  I provided 15 minutes of non-face-to-face time during this encounter.        History of Present Illness: 37 years old single White female referred by PCP for management of inattention.  Doing fair in regard to anxiety and inattention Managing attention on adderall now  Lost weight on anti - diabetic med  Aggravating factors; history of ADHD.  Grandparent death Modifying factors; her dog, job , family   Duration ADHD since young age;  Severity fair        Past Psychiatric History: anxiety, inattention  Previous Psychotropic Medications: Yes   Substance Abuse History in the last 12 months:  No.  Consequences of Substance Abuse: NA  Past Medical History:  Past  Medical History:  Diagnosis Date   Anxiety    Asthma    History of chicken pox    IBS (irritable bowel syndrome)    Migraines     Past Surgical History:  Procedure Laterality Date   tonsillectomy and adnoidectomy Bilateral 2007    Family Psychiatric History: denies  Family History:  Family History  Problem Relation Age of Onset   Migraines Mother    Prostate cancer Father    Hypertension Father    Asthma Sister    Lung cancer Maternal Grandmother        heavy smoker   Migraines Maternal Grandmother    Lung cancer Maternal Grandfather        heavy smoker   Asthma Sister     Social History:   Social History   Socioeconomic History   Marital status: Single    Spouse name: Not on file   Number of children: 0   Years of education: Not on file   Highest education level: Bachelor's degree (e.g., BA, AB, BS)  Occupational History   Not on file  Tobacco Use   Smoking status: Never   Smokeless tobacco: Never  Vaping Use   Vaping Use: Never used  Substance and Sexual Activity   Alcohol use: Yes    Comment: socially   Drug use: No   Sexual activity: Not Currently    Comment: Miranda Hunter  Other Topics Concern   Not on file  Social History Narrative   She is a Corporate investment banker   "Miranda Hunter"   Recently got a  new puppy- a golden retriever named Miranda Hunter   Caffeine once a week      Social Determinants of Corporate investment banker Strain: Not on file  Food Insecurity: Not on file  Transportation Needs: Not on file  Physical Activity: Not on file  Stress: Not on file  Social Connections: Not on file     Allergies:   Allergies  Allergen Reactions   Amoxicillin-Pot Clavulanate Diarrhea    Metabolic Disorder Labs: Lab Results  Component Value Date   HGBA1C 5.9 (A) 11/12/2022   No results found for: "PROLACTIN" Lab Results  Component Value Date   CHOL 167 11/12/2022   TRIG 347.0 (H) 11/12/2022   HDL 40.00 11/12/2022   CHOLHDL 4 11/12/2022   VLDL 69.4 (H) 11/12/2022    LDLCALC 129 (H) 08/24/2021   LDLCALC 104 (H) 02/08/2021   Lab Results  Component Value Date   TSH 1.260 05/04/2020    Therapeutic Level Labs: No results found for: "LITHIUM" No results found for: "CBMZ" No results found for: "VALPROATE"  Current Medications: Current Outpatient Medications  Medication Sig Dispense Refill   albuterol (VENTOLIN HFA) 108 (90 Base) MCG/ACT inhaler Inhale 2 puffs into the lungs every 6 (six) hours as needed for wheezing or shortness of breath. 18 g 2   amphetamine-dextroamphetamine (ADDERALL) 10 MG tablet Take 1 tablet (10 mg total) by mouth daily. 30 tablet 0   amphetamine-dextroamphetamine (ADDERALL) 5 MG tablet Take 1 tablet (5 mg total) by mouth daily. This is in addition to  . Take  in the am and this  at noon 30 tablet 0   cetirizine (ZYRTEC) 10 MG tablet Take 10 mg by mouth daily.     Clindamycin-Benzoyl Per, Refr, gel Apply 1 application topically daily.     escitalopram (LEXAPRO) 20 MG tablet TAKE 1 TABLET BY MOUTH EVERY DAY 30 tablet 2   fluticasone (FLONASE) 50 MCG/ACT nasal spray      Multiple Vitamin (MULTIVITAMIN) tablet Take 1 tablet by mouth daily.     ondansetron (ZOFRAN-ODT) 4 MG disintegrating tablet TAKE 1-2 TABLETS BY MOUTH EVERY 8 HOURS AS NEEDED FOR NAUSEA. 30 tablet 1   promethazine (PHENERGAN) 12.5 MG tablet Take 1 tablet (12.5 mg total) by mouth every 8 (eight) hours as needed for nausea or vomiting. 20 tablet 0   SLYND 4 MG TABS Take 1 tablet by mouth daily.     traZODone (DESYREL) 150 MG tablet Take 1 tablet (150 mg total) by mouth at bedtime. 90 tablet 1   Ubrogepant (UBRELVY) 50 MG TABS Take 1 tablet daily as needed for migraine. May take a second tablet after two hours if needed. 12 tablet 0   Ubrogepant (UBRELVY) 50 MG TABS TAKE AS DIRECTED 2 tablet 0   No current facility-administered medications for this visit.      Psychiatric Specialty Exam: Review of Systems  Cardiovascular:  Negative for chest pain.   Neurological:  Negative for weakness.  Psychiatric/Behavioral:  Positive for sleep disturbance. Negative for agitation and decreased concentration.     There were no vitals taken for this visit.There is no height or weight on file to calculate BMI.  General Appearance: Casual  Eye Contact:  Fair  Speech:  Slow  Volume:  Normal  Mood:fair  Affect:  Congruent  Thought Process:  Goal Directed  Orientation:  Full (Time, Place, and Person)  Thought Content:  Logical  Suicidal Thoughts:  No  Homicidal Thoughts:  No  Memory:  Immediate;  Fair Recent;   Fair  Judgement:  Fair  Insight:  Fair  Psychomotor Activity:  Normal  Concentration:  Concentration: Fair and Attention Span: variable  Recall:  Good  Fund of Knowledge:Good  Language: Good  Akathisia:  No  Handed:   AIMS (if indicated):  not done  Assets:  Desire for Improvement Housing  ADL's:  Intact  Cognition: WNL  Sleep:  Fair   Screenings: PHQ2-9    Flowsheet Row Video Visit from 08/13/2022 in [facility] from 12/03/2018 in [facility] Visit from 09/03/2017 in [facility]  PHQ-2 Total Score 0 5 0  PHQ-9 Total Score -- 13 --      Flowsheet Row Video Visit from 06/22/2022 in [facility] Video Visit from 02/16/2022 in [facility] at [facility] Video Visit from 11/10/2021 in [facility] at [facility]  C-SSRS RISK CATEGORY No Risk No Risk No Risk       Assessment and Plan: as follows  Prior documentation reviewed    ADHD : manageable continue adderall  Generalized anxiety disorder;fair continue lexapro   Insomnia: manageable continue trazadone, gets from pcp    Fu 66m.   [name], MD 4/15/202410:01 AM

## 2023-03-28 ENCOUNTER — Telehealth (HOSPITAL_COMMUNITY): Payer: Self-pay | Admitting: Psychiatry

## 2023-03-28 ENCOUNTER — Other Ambulatory Visit: Payer: Self-pay | Admitting: Physician Assistant

## 2023-03-28 MED ORDER — AMPHETAMINE-DEXTROAMPHETAMINE 10 MG PO TABS: 10.0000 mg | ORAL_TABLET | Freq: Every day | ORAL | 0 refills | Status: DC

## 2023-03-28 MED ORDER — AMPHETAMINE-DEXTROAMPHETAMINE 5 MG PO TABS: 5.0000 mg | ORAL_TABLET | Freq: Every day | ORAL | 0 refills | Status: DC

## 2023-03-28 NOTE — Telephone Encounter (Signed)
Patient called requesting refills of:   amphetamine-dextroamphetamine (ADDERALL) 10 MG tablet   amphetamine-dextroamphetamine (ADDERALL) 5 MG tablet   [id number] [place], [place] - [address] Phone: [phone]  Fax: [phone]      Last ordered: 03/04/2023 - 30 tablets each  Last visit: 03/11/2023  Next visit: 09/09/2023

## 2023-04-09 ENCOUNTER — Other Ambulatory Visit: Payer: Self-pay | Admitting: Physician Assistant

## 2023-04-09 DIAGNOSIS — F3289 Other specified depressive episodes: Secondary | ICD-10-CM

## 2023-04-10 ENCOUNTER — Institutional Professional Consult (permissible substitution): Payer: 59 | Admitting: Neurology

## 2023-04-10 ENCOUNTER — Telehealth: Payer: Self-pay | Admitting: Neurology

## 2023-04-25 ENCOUNTER — Other Ambulatory Visit: Payer: Self-pay | Admitting: Psychiatry

## 2023-04-25 ENCOUNTER — Telehealth (HOSPITAL_COMMUNITY): Payer: Self-pay | Admitting: Psychiatry

## 2023-04-25 NOTE — Telephone Encounter (Signed)
According to the database, she has been filling a total of 90 days' worth of each medication since 3/11 and should have enough to last until next week. I will defer this to Dr. Gilmore Laroche.

## 2023-04-25 NOTE — Telephone Encounter (Signed)
Patient called requesting refills of:    amphetamine-dextroamphetamine (ADDERALL) 10 MG tablet    amphetamine-dextroamphetamine (ADDERALL) 5 MG tablet        [facility] [address]   Last visit: 03/11/2023   Next visit: 09/09/2023

## 2023-04-26 ENCOUNTER — Telehealth (HOSPITAL_COMMUNITY): Payer: Self-pay | Admitting: *Deleted

## 2023-04-26 ENCOUNTER — Other Ambulatory Visit: Payer: Self-pay | Admitting: Psychiatry

## 2023-04-26 NOTE — Telephone Encounter (Signed)
Spoke with Rx they have on file/hold::  amphetamine-dextroamphetamine (ADDERALL) 10 MG tablet   Requesting  amphetamine-dextroamphetamine (ADDERALL) 5 MG tablet   [id number] [place], [place] - [address] [place]

## 2023-04-28 MED ORDER — AMPHETAMINE-DEXTROAMPHETAMINE 10 MG PO TABS: 10.0000 mg | ORAL_TABLET | Freq: Every day | ORAL | 0 refills | Status: DC

## 2023-04-28 MED ORDER — AMPHETAMINE-DEXTROAMPHETAMINE 5 MG PO TABS: 5.0000 mg | ORAL_TABLET | Freq: Every day | ORAL | 0 refills | Status: DC

## 2023-05-22 ENCOUNTER — Telehealth (HOSPITAL_COMMUNITY): Payer: Self-pay | Admitting: *Deleted

## 2023-05-22 NOTE — Telephone Encounter (Signed)
amphetamine-dextroamphetamine (ADDERALL) 5 MG  & 10 MG  tablet [[id number]]   Order Details Dose: 5 mg Route: Oral Frequency: Daily  Dispense Quantity: 30 tablet Refills: 0        Sig: Take 1 tablet (5 mg total) by mouth daily.  This is in addition to 10mg  .  Take 10mg  in the am and this 5mg  at noon   [id number], [place] - [address]   LAST APPT  03/11/23 NEXT APPT  09/09/23

## 2023-05-23 MED ORDER — AMPHETAMINE-DEXTROAMPHETAMINE 5 MG PO TABS: 5.0000 mg | ORAL_TABLET | Freq: Every day | ORAL | 0 refills | Status: DC

## 2023-05-23 MED ORDER — AMPHETAMINE-DEXTROAMPHETAMINE 10 MG PO TABS: 10.0000 mg | ORAL_TABLET | Freq: Every day | ORAL | 0 refills | Status: DC

## 2023-05-23 NOTE — Addendum Note (Signed)
Addended by: [name] on: 05/23/2023 08:40 AM   Modules accepted: Orders

## 2023-06-24 ENCOUNTER — Encounter: Payer: Self-pay | Admitting: Physician Assistant

## 2023-06-24 ENCOUNTER — Telehealth (HOSPITAL_COMMUNITY): Payer: Self-pay | Admitting: Psychiatry

## 2023-06-24 MED ORDER — AMPHETAMINE-DEXTROAMPHETAMINE 10 MG PO TABS: 10.0000 mg | ORAL_TABLET | Freq: Every day | ORAL | 0 refills | Status: DC

## 2023-06-24 MED ORDER — AMPHETAMINE-DEXTROAMPHETAMINE 5 MG PO TABS: 5.0000 mg | ORAL_TABLET | Freq: Every day | ORAL | 0 refills | Status: DC

## 2023-06-24 NOTE — Telephone Encounter (Signed)
Medication management - Message left for patient that Dr. Gilmore Laroche had sent in her requested new Adderall 10 mg and 5 mg [facility] [address]. Patient to call back if any questions or problems with orders.

## 2023-06-24 NOTE — Telephone Encounter (Signed)
Patient called requesting refills of:  amphetamine-dextroamphetamine (ADDERALL) 10 MG tablet    amphetamine-dextroamphetamine (ADDERALL) 5 MG tablet   [id number] [place], [place] - [address] [place] (Ph: [phone])   Last ordered: 05/23/2023 - 30 tablets each  Last visit: 03/11/2023  Next visit: 09/09/2023

## 2023-06-25 ENCOUNTER — Encounter: Payer: Self-pay | Admitting: Physician Assistant

## 2023-06-25 ENCOUNTER — Telehealth: Payer: 59 | Admitting: Physician Assistant

## 2023-06-25 VITALS — Temp 100.0°F | Ht 69.0 in | Wt 258.0 lb

## 2023-06-25 DIAGNOSIS — J4540 Moderate persistent asthma, uncomplicated: Secondary | ICD-10-CM

## 2023-06-25 DIAGNOSIS — U071 COVID-19: Secondary | ICD-10-CM

## 2023-06-25 MED ORDER — ALBUTEROL SULFATE HFA 108 (90 BASE) MCG/ACT IN AERS: 2.0000 | INHALATION_SPRAY | Freq: Four times a day (QID) | RESPIRATORY_TRACT | 2 refills | Status: AC | PRN

## 2023-06-25 MED ORDER — DOXYCYCLINE HYCLATE 100 MG PO TABS: 100.0000 mg | ORAL_TABLET | Freq: Two times a day (BID) | ORAL | 0 refills | Status: DC

## 2023-06-25 MED ORDER — HYDROCOD POLI-CHLORPHE POLI ER 10-8 MG/5ML PO SUER: 5.0000 mL | Freq: Every evening | ORAL | 0 refills | Status: DC | PRN

## 2023-06-25 MED ORDER — PREDNISONE 20 MG PO TABS: 20.0000 mg | ORAL_TABLET | Freq: Two times a day (BID) | ORAL | 0 refills | Status: DC

## 2023-06-25 NOTE — Progress Notes (Signed)
Virtual Visit via Video   I connected with Miranda Hunter on 06/25/23 at  8:00 AM EDT by a video enabled telemedicine application and verified that I am speaking with the correct person using two identifiers. Location patient: Home Location provider: [facility], Office Persons participating in the virtual visit: Cariah, Tobey PA-C, Corky Mull, LPN   I discussed the limitations of evaluation and management by telemedicine and the availability of in person appointments. The patient expressed understanding and agreed to proceed.  I acted as a Neurosurgeon for [employer], [employer], LPN   Subjective:   HPI:   COVID-19 Positive Symptom onset: 06/21/2023 Travel/contacts: exposed to Brother in law, whole family has it. Vaccination status: 1 vaccine J&J Testing results: Home test positive yesterday 06/24/2023  Patient endorses the following symptoms: Fever 101 yesterday, this morning 100, cough and expectorating green, chest congestion, some SOB with coughing, headache sinus pressure.  Patient denies the following symptoms: Denies chest pain.  She has asthma and needs new inhaler. Feels like her current albuterol inhaler is not working correctly.  Has had COVID in the past and took molnupiravir and it made her feel quite sick   Treatments tried: Advil and Tylenol, Sudafed yesterday  ROS: See pertinent positives and negatives per HPI.  Patient Active Problem List   Diagnosis Date Noted   Vitamin D deficiency 11/09/2020   Attention deficit hyperactivity disorder (ADHD) 10/14/2020   Transaminitis 06/06/2020   Hyperlipidemia, mixed 06/06/2020   Other insomnia 06/06/2020   Cervical intraepithelial neoplasia grade 1 04/27/2020   Insulin resistance 10/01/2019   Depression 04/28/2019   Migraine with aura and without status migrainosus, not intractable 10/14/2018   Obesity (BMI 30-39.9) 10/14/2018   Moderate persistent asthma 09/16/2018    Social  History   Tobacco Use   Smoking status: Never   Smokeless tobacco: Never  Substance Use Topics   Alcohol use: Yes    Comment: socially    Current Outpatient Medications:    amphetamine-dextroamphetamine (ADDERALL) 10 MG tablet, Take 1 tablet (10 mg total) by mouth daily., Disp: 30 tablet, Rfl: 0   amphetamine-dextroamphetamine (ADDERALL) 5 MG tablet, Take 1 tablet (5 mg total) by mouth daily. This is in addition to 10mg  . Take 10mg  in the am and this 5mg  at noon, Disp: 30 tablet, Rfl: 0   cetirizine (ZYRTEC) 10 MG tablet, Take 10 mg by mouth daily., Disp: , Rfl:    chlorpheniramine-HYDROcodone (TUSSIONEX) 10-8 MG/5ML, Take 5 mLs by mouth at bedtime as needed for cough., Disp: 115 mL, Rfl: 0   Clindamycin-Benzoyl Per, Refr, gel, Apply 1 application topically daily., Disp: , Rfl:    doxycycline (VIBRA-TABS) 100 MG tablet, Take 1 tablet (100 mg total) by mouth 2 (two) times daily., Disp: 14 tablet, Rfl: 0   escitalopram (LEXAPRO) 20 MG tablet, TAKE 1 TABLET BY MOUTH EVERY DAY, Disp: 30 tablet, Rfl: 2   fluticasone (FLONASE) 50 MCG/ACT nasal spray, , Disp: , Rfl:    medroxyPROGESTERone Acetate 150 MG/ML SUSY, Inject 1 mL every 3 months by intramuscular route., Disp: , Rfl:    Multiple Vitamin (MULTIVITAMIN) tablet, Take 1 tablet by mouth daily., Disp: , Rfl:    ondansetron (ZOFRAN-ODT) 4 MG disintegrating tablet, TAKE 1-2 TABLETS BY MOUTH EVERY 8 HOURS AS NEEDED FOR NAUSEA., Disp: 30 tablet, Rfl: 1   predniSONE (DELTASONE) 20 MG tablet, Take 1 tablet (20 mg total) by mouth 2 (two) times daily with a meal., Disp: 10 tablet, Rfl: 0  promethazine (PHENERGAN) 12.5 MG tablet, Take 1 tablet (12.5 mg total) by mouth every 8 (eight) hours as needed for nausea or vomiting., Disp: 20 tablet, Rfl: 0   tirzepatide (MOUNJARO) 5 MG/0.5ML Pen, Inject 5 mg into the skin once a week., Disp: , Rfl:    traZODone (DESYREL) 150 MG tablet, Take 1 tablet (150 mg total) by mouth at bedtime., Disp: 90 tablet, Rfl: 1    Ubrogepant (UBRELVY) 50 MG TABS, Take 1 tablet daily as needed for migraine. May take a second tablet after two hours if needed., Disp: 12 tablet, Rfl: 0   albuterol (VENTOLIN HFA) 108 (90 Base) MCG/ACT inhaler, Inhale 2 puffs into the lungs every 6 (six) hours as needed for wheezing or shortness of breath., Disp: 18 g, Rfl: 2  Allergies  Allergen Reactions   Amoxicillin-Pot Clavulanate Diarrhea    Objective:   VITALS: Per patient if applicable, see vitals. GENERAL: Alert, appears well and in no acute distress. HEENT: Atraumatic, conjunctiva clear, no obvious abnormalities on inspection of external nose and ears. NECK: Normal movements of the head and neck. CARDIOPULMONARY: No increased WOB. Speaking in clear sentences. I:E ratio WNL.  MS: Moves all visible extremities without noticeable abnormality. PSYCH: Pleasant and cooperative, well-groomed. Speech normal rate and rhythm. Affect is appropriate. Insight and judgement are appropriate. Attention is focused, linear, and appropriate.  NEURO: CN grossly intact. Oriented as arrived to appointment on time with no prompting. Moves both UE equally.  SKIN: No obvious lesions, wounds, erythema, or cyanosis noted on face or hands.  Assessment and Plan:   Diala "[name]" was seen today for covid positive.  Diagnoses and all orders for this visit:  COVID-19  Moderate persistent asthma, unspecified whether complicated  Other orders -     predniSONE (DELTASONE) 20 MG tablet; Take 1 tablet (20 mg total) by mouth 2 (two) times daily with a meal. -     chlorpheniramine-HYDROcodone (TUSSIONEX) 10-8 MG/5ML; Take 5 mLs by mouth at bedtime as needed for cough. -     doxycycline (VIBRA-TABS) 100 MG tablet; Take 1 tablet (100 mg total) by mouth 2 (two) times daily. -     albuterol (VENTOLIN HFA) 108 (90 Base) MCG/ACT inhaler; Inhale 2 puffs into the lungs every 6 (six) hours as needed for wheezing or shortness of breath.    No red flags on  discussion, patient is not in any obvious distress during our visit. Discussed progression of most viral illnesses, and recommended supportive care at this point in time.   Discussed starting oral prednisone, albuterol and tussionex cough syrup for current symptoms and asthma  I did however provide pocket rx for oral doxycycline for sinusitis should symptoms not improve as anticipated.  Discussed over the counter supportive care options, including Tylenol 500 mg q 8 hours, with recommendations to push fluids and rest. Reviewed return precautions including new/worsening fever, SOB, new/worsening cough, sudden onset changes of symptoms. Recommended need to self-quarantine and practice social distancing until symptoms resolve. I recommend that patient follow-up if symptoms worsen or persist despite treatment x 7-10 days, sooner if needed.  I discussed the assessment and treatment plan with the patient. The patient was provided an opportunity to ask questions and all were answered. The patient agreed with the plan and demonstrated an understanding of the instructions.   The patient was advised to call back or seek an in-person evaluation if the symptoms worsen or if the condition fails to improve as anticipated.   CMA or LPN served  as scribe during this visit. History, Physical, and Plan performed by medical provider. The above documentation has been reviewed and is accurate and complete.   [place], [place] 06/25/2023

## 2023-07-08 ENCOUNTER — Other Ambulatory Visit: Payer: Self-pay | Admitting: Physician Assistant

## 2023-07-08 DIAGNOSIS — F3289 Other specified depressive episodes: Secondary | ICD-10-CM

## 2023-07-17 ENCOUNTER — Telehealth: Payer: Self-pay | Admitting: Neurology

## 2023-07-17 ENCOUNTER — Ambulatory Visit: Payer: 59 | Admitting: Neurology

## 2023-07-17 NOTE — Telephone Encounter (Signed)
Please call patient and offer 8/27 at 8:30 am arrival 8. Appt is currently on hold.

## 2023-07-17 NOTE — Telephone Encounter (Signed)
Called pt to reschedule appt 07/17/2023. Pt states she would like a call a from someone who can work her in sooner. She states this is the second time this has happened to her and it is not fair she cannot be worked in sooner than November. Original appt was May 2024.

## 2023-07-18 ENCOUNTER — Telehealth (HOSPITAL_COMMUNITY): Payer: Self-pay | Admitting: *Deleted

## 2023-07-18 MED ORDER — AMPHETAMINE-DEXTROAMPHETAMINE 5 MG PO TABS: 5.0000 mg | ORAL_TABLET | Freq: Every day | ORAL | 0 refills | Status: DC

## 2023-07-18 NOTE — Addendum Note (Signed)
Addended by: [name] on: 07/18/2023 12:59 PM   Modules accepted: Orders

## 2023-07-18 NOTE — Telephone Encounter (Signed)
Patient Refill Request--  [id number] [place], [place] - [address]   amphetamine-dextroamphetamine (ADDERALL) 5 mg &10 MG tablet   Next Appt 09/09/23 Last Appt  03/11/23

## 2023-07-19 ENCOUNTER — Telehealth (HOSPITAL_COMMUNITY): Payer: Self-pay | Admitting: *Deleted

## 2023-07-19 MED ORDER — AMPHETAMINE-DEXTROAMPHETAMINE 10 MG PO TABS: 10.0000 mg | ORAL_TABLET | Freq: Every day | ORAL | 0 refills | Status: DC

## 2023-07-19 NOTE — Telephone Encounter (Signed)
ADDERALL 10 MG WASN'T SENT ONLY THE 5 MG [id number] - [place], [place] - [address]    amphetamine-dextroamphetamine (ADDERALL) 10 MG tablet    Next Appt 09/09/23 Last Appt  03/11/23

## 2023-07-19 NOTE — Addendum Note (Signed)
Addended by: [name] on: 07/19/2023 12:16 PM   Modules accepted: Orders

## 2023-07-23 ENCOUNTER — Ambulatory Visit: Payer: 59 | Admitting: Neurology

## 2023-07-23 ENCOUNTER — Encounter: Payer: Self-pay | Admitting: Neurology

## 2023-07-23 VITALS — BP 117/84 | HR 110 | Ht 69.0 in | Wt 253.2 lb

## 2023-07-23 DIAGNOSIS — G43821 Menstrual migraine, not intractable, with status migrainosus: Secondary | ICD-10-CM

## 2023-07-23 DIAGNOSIS — R739 Hyperglycemia, unspecified: Secondary | ICD-10-CM

## 2023-07-23 DIAGNOSIS — G43109 Migraine with aura, not intractable, without status migrainosus: Secondary | ICD-10-CM | POA: Diagnosis not present

## 2023-07-23 MED ORDER — SUMATRIPTAN SUCCINATE 6 MG/0.5ML ~~LOC~~ SOAJ
6.0000 mg | SUBCUTANEOUS | 11 refills | Status: DC
Start: 2023-07-23 — End: 2024-01-22

## 2023-07-23 MED ORDER — PROMETHAZINE HCL 25 MG PO TABS: 12.5000 mg | ORAL_TABLET | Freq: Three times a day (TID) | ORAL | 11 refills | Status: DC | PRN

## 2023-07-23 NOTE — Progress Notes (Signed)
[facility]    Provider:  Dr Lucia Gaskins Referring Provider: Jarold Motto, PA Primary Care Physician:  Jarold Motto, [place]  CC:  Migraines  History of present illness: July 23, 2023.  Patient is here for migraines.has Moderate persistent asthma; Migraine with aura and without status migrainosus, not intractable; Obesity (BMI 30-39.9); Depression; Insulin resistance; Cervical intraepithelial neoplasia grade 1; Transaminitis; Hyperlipidemia, mixed; Other insomnia; Attention deficit hyperactivity disorder (ADHD); and Vitamin D deficiency on their problem list.   She was on depo and changed to a progesterone only pill and is going back to depo just started back second shot. She had migraines non stop when she didn;t have the depo and had extended periods and migraines. Greggory Keen has hekped her lose 22 pounds. In February she had a severe migraines that lasted weeks and went to pcp and got a migraine cocktail at her pcp, it was so severe her family came to help her. She was prescribed Bernita Raisin but she doesn' see that it works well. She needs something that works immediately, also associated with longer periods and hormones, we discussed preventative and patient would like to hold off now until weight loss. Will discuss qulipta, ajovy, emgality, vyepti, nurtec can take every other day for prevention  Review of my records I the past:  We saw patient in the past last was in February 2021.  She started having migraines in high school and worsened as a freshman in college, she [facility] and tried multiple medications, managing her menstruation helped, at 1 point she had a migraine that lasted 9 days, she vomits, her migraines can be 10 out of 10 pain, no auras, migraines start in the left side, throbbing, pulsating, light sound smell sensitivity, nausea, vomiting dark room makes it better movement makes it worse her whole belly hurts.  Extensive family history in  mother and grandmother.  Her migraines have been exacerbated by her birth control method.  At the time she was having daily migraines she was working with OB we discussed stroke in patients with migraine with aura, she had gained lots of weight in November 2019 so we started Topamax as a preventative and sent her to a weight loss program with Orlene Plum.  I gave her samples of Aimovig for several weeks.  When we saw her in February 2021 she felt her migraines are well-controlled, managing her birth control method, she was taken off the Depo shot..  She has migraines with aura and OB/GYN is aware of restrictions.   I referred her to [facility] to help manage her depression and ADHD and other mood disorders and at that time she had a dull headaches a month to mild, 2 migraine days a month treated acutely, and having frequent auras.  Nausea had been really bad so we tried Phenergan as Zofran was not working.  Maxalt was working we continue that for her 2 migraines a month, nausea was not well-controlled prescribed Phenergan, we decided to treat her acutely and had started going to see Dr. Lenord Fellers [facility].  The last time we saw her we prescribed Phenergan, Maxalt and Zofran. She has used sumatriptan oral and nasal in the past. She has migraines 6 migraine days a month but les than 10 total headache days a month. Rizatriptan stopped being as effective.   From a thorough review of records, medications tried that can be used in migraine prevention include: Propranolol, topiramate, nortriptyline, Maxalt, sumatriptan,  Lexapro, rizatriptan, Wellbutrin, Aimovig, and Cambia, Frova, Zofran, prednisone, amitriptyline,topiramate, nortriptyline.  She also tried Phenergan, Zofran was not working as well, Maxalt was working well for her migraines we were treating acutely last time we saw her, and she was seeing Dr. Dalbert Garnet at the [facility], also tried Zofran  which did not work so we prescribed Phenergan, ubrelvy  Patient complains of symptoms per HPI as well as the following symptoms: none . Pertinent negatives and positives per HPI. All others negative  Reviewed notes, labs and imaging from outside physicians, which showed:   Interval 01/12/2020: She feels her migraines are well controlled. She has been trying to manage her birth control, she was taken off of the depo shot. She has migraine with aura and obgyn is aware of restrictions there as is patient. Meds tried include topamax, lexapro, maxalt, rizatriptan, wellbutrin, aimovig, cambia, frova, zofran, prednisone, propranolol, nortriptyline. She is having difficulty with her ADD and she was on aderal in the past, she also has anxiety I feel she would benefit from someone professionally manage this will refer to [facility].  She feels her add trying to concerntrate is a trigger. 8 dull headaches a month which are mild, 2 migraine days a month which are treated acutely. She always has an aura. Maxalt does hekp when she has the headaches/migraines and the dull headaches are not bad. The nausea has been the worst, we can try phenergan as zofran not helping.   Interval history 10/14/2018: changed BC method and since then daily headaches. She was on depo q59months and did well but had to stop it because of the bone density fear. It'll take time on the new birth control medication, she is having migraines daily.  We spent an extended amount of time discussing birth control and migraines.  She was just changed to low-dose estrogen, distressed stroke risks in patients with migraine aura.  Medication she is on is very similar to NuvaRing, I do like NuvaRing in the management of migraine headaches as it appears to keep the estrogen levels very stable which is important and migraines.  At this point I do agree with her gynecologist at that it just may take several months to become adjusted to the new  medications.  Discussed with patient why she wanted to do in the meantime, she is gained lots of weight and so was in favor of starting Topamax as a preventative but I discussed she probably needs a weight loss program so we will refer her to the [facility] with Dr. Orlene Plum.  We can also give her samples of Aimovig for several months.  Asked her to email me in 6 to 8 weeks and if she is feeling better she may go back to her prior baseline and not need preventatives.  HPI:  Darren Odekirk is a 38 y.o. female here as a referral from Dr. Bufford Buttner for migraines. Mother is here and provides information. She started having migraines in HS and then worsened as a freshman in college. [facility] and tried multiple medications. No known triggers. Managing her menstruation helped. In October her migraine lasted 9 days. She was vomiting, was so painful, 10/10 pain and this was her tipping point. She has had no auras but also has an aura, starts on the left side, throbbing, pulsating, light/sound/smell sensitivity, dark room better, movement makes it worse, her whole body hurts. Extensive family history of migraines  in mother, grandmother. She has numbness in the toes and tongue, no weakness. Every 3 months she gets a migraine and it is predictable. No other focal neurologic deficits, associated symptoms, inciting events or modifiable factors.  Reviewed notes, labs and imaging from outside physicians, which showed:  Reviewed referring physician's notes, patient was last seen in October 2018 and she had been dealing with a migraine for 8 days, 3-4 clusters of migraines throughout the day, in the past she typically would get one headache per month prior to.,  Not sexually active, Imitrex did not work and gave her side effects, butalbital makes her very moody and temperamental, has never been on a preventative medication.  In the past she worked on diet elimination  but this was not helpful, headaches would typically come and go around her.  Periods started at the age of 56, worsened as a freshman in college.  She went to the [facility] did biofeedback without relief.  Did not do acupuncture.  Her OB/GYN put her on Depo-Provera which has helped for the last 3 years.  Tried Nexplanon for 2 years but discontinued due to continuous headache and bleeding.  Never had imaging of her brain.  TSH, CMP, HIV, CBC all unremarkable only abnormality with slightly elevated white blood cells 11.7.  And elevated platelets 445.  Review of Systems: Patient complains of symptoms per HPI as well as the following symptoms: difficulty with concentration, ADD. Pertinent negatives and positives per HPI. All others negative.   Social History   Socioeconomic History   Marital status: Single    Spouse name: Not on file   Number of children: 0   Years of education: Not on file   Highest education level: Bachelor's degree (e.g., BA, AB, BS)  Occupational History   Not on file  Tobacco Use   Smoking status: Never   Smokeless tobacco: Never  Vaping Use   Vaping status: Never Used  Substance and Sexual Activity   Alcohol use: Yes    Comment: socially   Drug use: No   Sexual activity: Not Currently    Comment: kyleena  Other Topics Concern   Not on file  Social History Narrative   She is a Corporate investment banker   "[name]"   Recently got a new puppy- a Advertising account planner named [name]   Caffeine once a week      Social Determinants of Corporate investment banker Strain: Not on file  Food Insecurity: Not on file  Transportation Needs: Not on file  Physical Activity: Not on file  Stress: Not on file  Social Connections: Not on file  Intimate Partner Violence: Not on file    Family History  Problem Relation Age of Onset   Migraines Mother    Prostate cancer Father    Hypertension Father    Asthma Sister    Lung cancer Maternal Grandmother        heavy smoker    Migraines Maternal Grandmother    Lung cancer Maternal Grandfather        heavy smoker   Asthma Sister     Past Medical History:  Diagnosis Date   Anxiety    Asthma    History of chicken pox    IBS (irritable bowel syndrome)    Migraines     Past Surgical History:  Procedure Laterality Date   tonsillectomy and adnoidectomy Bilateral 2007    Current Outpatient Medications  Medication Sig Dispense Refill   albuterol (VENTOLIN HFA) 108 (  90 Base) MCG/ACT inhaler Inhale 2 puffs into the lungs every 6 (six) hours as needed for wheezing or shortness of breath. 18 g 2   amphetamine-dextroamphetamine (ADDERALL) 10 MG tablet Take 1 tablet (10 mg total) by mouth daily. 30 tablet 0   amphetamine-dextroamphetamine (ADDERALL) 5 MG tablet Take 1 tablet (5 mg total) by mouth daily. This is in addition to 10mg  . Take 10mg  in the am and this 5mg  at noon 30 tablet 0   cetirizine (ZYRTEC) 10 MG tablet Take 10 mg by mouth daily.     escitalopram (LEXAPRO) 20 MG tablet TAKE 1 TABLET BY MOUTH EVERY DAY 30 tablet 2   fluticasone (FLONASE) 50 MCG/ACT nasal spray      medroxyPROGESTERone Acetate 150 MG/ML SUSY Inject 1 mL every 3 months by intramuscular route.     Multiple Vitamin (MULTIVITAMIN) tablet Take 1 tablet by mouth daily.     ondansetron (ZOFRAN-ODT) 4 MG disintegrating tablet TAKE 1-2 TABLETS BY MOUTH EVERY 8 HOURS AS NEEDED FOR NAUSEA. 30 tablet 1   SUMAtriptan 6 MG/0.5ML SOAJ Inject 6 mg into the skin every 2 (two) hours. 3 mL 11   tirzepatide (MOUNJARO) 5 MG/0.5ML Pen Inject 7.5 mg into the skin once a week.     traZODone (DESYREL) 150 MG tablet Take 1 tablet (150 mg total) by mouth at bedtime. 90 tablet 1   Ubrogepant (UBRELVY) 50 MG TABS Take 1 tablet daily as needed for migraine. May take a second tablet after two hours if needed. 12 tablet 0   promethazine (PHENERGAN) 25 MG tablet Take 0.5 tablets (12.5 mg total) by mouth every 8 (eight) hours as needed for nausea or vomiting. 20 tablet 11    No current facility-administered medications for this visit.    Allergies as of 07/23/2023 - Review Complete 07/23/2023  Allergen Reaction Noted   Amoxicillin-pot clavulanate Diarrhea 12/04/2016    Vitals: BP 117/84   Pulse (!) 110   Ht 5\' 9"  (1.753 m)   Wt 253 lb 3.2 oz (114.9 kg)   BMI 37.39 kg/m  Last Weight:  Wt Readings from Last 1 Encounters:  07/23/23 253 lb 3.2 oz (114.9 kg)   Last Height:   Ht Readings from Last 1 Encounters:  07/23/23 5\' 9"  (1.753 m)  Physical exam: Exam: Gen: NAD, conversant, well nourised, obese, well groomed                     CV: RRR, no MRG. No Carotid Bruits. No peripheral edema, warm, nontender Eyes: Conjunctivae clear without exudates or hemorrhage  Neuro: Detailed Neurologic Exam  Speech:    Speech is normal; fluent and spontaneous with normal comprehension.  Cognition:    The patient is oriented to person, place, and time;     recent and remote memory intact;     language fluent;     normal attention, concentration,     fund of knowledge Cranial Nerves:    The pupils are equal, round, and reactive to light. The fundi are normal and spontaneous venous pulsations are present. Visual fields are full to finger confrontation. Extraocular movements are intact. Trigeminal sensation is intact and the muscles of mastication are normal. The face is symmetric. The palate elevates in the midline. Hearing intact. Voice is normal. Shoulder shrug is normal. The tongue has normal motion without fasciculations.   Coordination:    Normal finger to nose and heel to shin. Normal rapid alternating movements.   Gait:    Heel-toe  and tandem gait are normal.   Motor Observation:    No asymmetry, no atrophy, and no involuntary movements noted. Tone:    Normal muscle tone.    Posture:    Posture is normal. normal erect    Strength:    Strength is V/V in the upper and lower limbs.      Sensation: intact to LT     Reflex Exam:  DTR's:     Deep tendon reflexes in the upper and lower extremities are normal bilaterally.   Toes:    The toes are downgoing bilaterally.   Clonus:    Clonus is absent.    Assessment/Plan: This is a very nice 37 year old patient with migraine with aura.    - She reports she has max migraine days a month that are not controlled with Maxalt, her nausea is not controlled so we will prescribe Phenergan today.  Given this frequency of migraines I do not think that is necessary to start her on a migraine preventative.  At this time for her migraines we will manage her acutely given the frequency of 5 migraine days a month.  We will have her try some Phenergan for her nausea. Try Sumatriptan Injections because her migraines come on fast  - She does work closely with her gynecologist for birth control given she has migraine with aura with increased stroke risk.    - She also goes to the [facility] and says that has been a life saver through Covid especially Dr. Dalbert Garnet.    - She is having difficulty with her ADD and she was on adderall in the past, she also has anxiety I feel she would benefit from someone professionally manage tand she found a psychiatric and therapist  At onset of migraine, imitrex injections:  Will discuss qulipta, ajovy, emgality, vyepti as preventatives when needed,  nurtec can also take every other day for prevention  Orders Placed This Encounter  Procedures   CBC with Differential/Platelets   Comprehensive metabolic panel   TSH Rfx on Abnormal to Free T4   Lipid Panel   Hemoglobin A1c   Meds ordered this encounter  Medications   SUMAtriptan 6 MG/0.5ML SOAJ    Sig: Inject 6 mg into the skin every 2 (two) hours.    Dispense:  3 mL    Refill:  11   promethazine (PHENERGAN) 25 MG tablet    Sig: Take 0.5 tablets (12.5 mg total) by mouth every 8 (eight) hours as needed for nausea or vomiting.    Dispense:  20 tablet    Refill:  11   Discussed:  There is  increased risk for stroke in women with migraine with aura and a  Contraindication for the combined contraceptive pill for use by women who have migraine with aura, which is in line with World Health Organisation recommendations. The risk for women with migraine without aura is lower and other risk factors like smoking are far more likely to increase stroke risk than migraine. There is a recommendation for no smoking and for the use of low estrogen or progestogen only pills particularly for women with migraine with aura. It is important however that women with migraine who are taking the pill do not decide to suddenly stop taking it without discussing this with their doctor. Please discuss with her OB/GYN.  Discussed: To prevent or relieve headaches, try the following: Cool Compress. Lie down and place a cool compress on your head.  Avoid headache  triggers. If certain foods or odors seem to have triggered your migraines in the past, avoid them. A headache diary might help you identify triggers.  Include physical activity in your daily routine. Try a daily walk or other moderate aerobic exercise.  Manage stress. Find healthy ways to cope with the stressors, such as delegating tasks on your to-do list.  Practice relaxation techniques. Try deep breathing, yoga, massage and visualization.  Eat regularly. Eating regularly scheduled meals and maintaining a healthy diet might help prevent headaches. Also, drink plenty of fluids.  Follow a regular sleep schedule. Sleep deprivation might contribute to headaches Consider biofeedback. With this mind-body technique, you learn to control certain bodily functions -- such as muscle tension, heart rate and blood pressure -- to prevent headaches or reduce headache pain.    Proceed to emergency room if you experience new or worsening symptoms or symptoms do not resolve, if you have new neurologic symptoms or if headache is severe, or for any concerning symptom.    Provided education and documentation from American headache Society toolbox including articles on: chronic migraine medication overuse headache, chronic migraines, prevention of migraines, behavioral and other nonpharmacologic treatments for headache.  Cc: Jarold Motto, PA, Ellis Savage PA, Quillian Quince MD  Naomie Dean, MD  [facility] [address] [place], [place] [address]  Phone [phone] Fax [phone]  A total of over 45 minutes was spent face-to-face with this patient. Over half this time was spent on counseling patient on the  1. Migraine with aura and without status migrainosus, not intractable   2. Menstrual migraine with status migrainosus, not intractable   3. Elevated serum glucose     diagnosis and different diagnostic and therapeutic options, counseling and coordination of care, risks ans benefits of management, compliance, or risk factor reduction and education.

## 2023-07-23 NOTE — Patient Instructions (Addendum)
At onset of migraine, imitrex injections:  Will discuss qulipta, ajovy, emgality, vyepti as preventatives when needed,  nurtec can also take every other day for prevention  Promethazine Injection What is this medication? PROMETHAZINE (proe METH a zeen) prevents and treats the symptoms of an allergic reaction. It works by blocking histamine, a substance released by the body during an allergic reaction. It may also help you relax, go to sleep, and relieve nausea, vomiting, or pain before or after procedures. It can also treat motion sickness. It works by helping your nervous system calm down by blocking substances in the body that may cause nausea and vomiting. It belongs to a group of medications called antihistamines. This medicine may be used for other purposes; ask your health care provider or pharmacist if you have questions. COMMON BRAND NAME(S): Anergan-50, Pentazine, Phenergan What should I tell my care team before I take this medication? They need to know if you have any of these conditions: Blockage in your bowels Diabetes Glaucoma Have trouble controlling your muscles Heart disease Liver disease Low blood cell levels (white cells, red cells, and platelets) Lung or breathing disease, such as asthma Parkinson disease Prostate disease Seizures Stomach or intestine problems Trouble passing urine An unusual or allergic reaction to promethazine, sulfites, other medications, foods, dyes, or preservatives Pregnant or trying to get pregnant Breastfeeding How should I use this medication? This medication is for injection into a muscle, or into a vein. It is given in a hospital or clinic setting. Talk to your care team about the use of this medication in children. This medication should not be given to infants and children younger than 48 years old. Overdosage: If you think you have taken too much of this medicine contact a poison control center or emergency room at once. NOTE: This  medicine is only for you. Do not share this medicine with others. What if I miss a dose? This does not apply. What may interact with this medication? Alcohol Antihistamines for allergy, cough, and cold Atropine Certain medications for anxiety or sleep Certain medications for bladder problems, such as oxybutynin or tolterodine Certain medications for depression, such as amitriptyline, fluoxetine, sertraline Certain medications for Parkinson disease, such as benztropine or trihexyphenidyl Certain medications for seizures, such as phenobarbital, primidone, phenytoin Certain medications for stomach problems, such as dicyclomine, hyoscyamine Certain medications for travel sickness, such as scopolamine Epinephrine General anesthetics, such as halothane, isoflurane, methoxyflurane, propofol Ipratropium MAOIs, such as Marplan, Nardil, and Parnate Medications for blood pressure Medications that relax muscles for surgery Metoclopramide Opioids This list may not describe all possible interactions. Give your health care provider a list of all the medicines, herbs, non-prescription drugs, or dietary supplements you use. Also tell them if you smoke, drink alcohol, or use illegal drugs. Some items may interact with your medicine. What should I watch for while using this medication? Your condition will be monitored carefully while you are receiving this medication. Your care team will discuss with you the risks and the benefits of using this medication. This medication has caused serious side effects in some patients after it was injected into a vein. Watch closely for any signs or symptoms of a local reaction like burning, pain, redness, swelling, and blistering and tell your care team immediately if any occur. These symptoms may occur when you receive the injection or may occur hours or even days after the injection. This medication may affect your coordination, reaction time, or judgment. Do not drive  or operate machinery  until you know how this medication affects you. Sit up or stand slowly to reduce the risk of dizzy or fainting spells. Drinking alcohol with this medication can increase the risk of these side effects. Your mouth may get dry. Chewing sugarless gum or sucking hard candy and drinking plenty of water may help. This medication may cause dry eyes and blurred vision. If you wear contact lenses, you may feel some discomfort. Lubricating eye drops may help. See your care team if the problem does not go away or is severe. This medication can make you more sensitive to the sun. Keep out of the sun. If you cannot avoid being in the sun, wear protective clothing and sunscreen. Do not use sun lamps, tanning beds, or tanning booths. This medication may increase blood sugar. The risk may be higher in patients who already have diabetes. Ask your care team what you can do to lower your risk of diabetes while taking this medication. What side effects may I notice from receiving this medication? Side effects that you should report to your care team as soon as possible: Allergic reactions--skin rash, itching, hives, swelling of the face, lips, tongue, or throat CNS depression--slow or shallow breathing, shortness of breath, feeling faint, dizziness, confusion, trouble staying awake High fever stiff muscles, increased sweating, fast or irregular heartbeat, and confusion, which may be signs of neuroleptic malignant syndrome Infection--fever, chills, cough, or sore throat Liver injury--right upper belly pain, loss of appetite, nausea, light-colored stool, dark yellow or brown urine, yellowing skin or eyes, unusual weakness or fatigue Pain, redness, or irritation at injection site Seizures Sudden eye pain or change in vision such as blurry vision, seeing halos around lights, vision loss Trouble passing urine Uncontrolled and repetitive body movements, muscle stiffness or spasms, tremors or shaking, loss  of balance or coordination, restlessness, shuffling walk, which may be signs of extrapyramidal symptoms (EPS) Side effects that usually do not require medical attention (report to your care team if they continue or are bothersome): Confusion Constipation Dizziness Drowsiness Dry mouth Sensitivity to light Vivid dreams or nightmares This list may not describe all possible side effects. Call your doctor for medical advice about side effects. You may report side effects to FDA at 1-800-FDA-1088. Where should I keep my medication? This medication is given in a hospital or clinic and will not be stored at home. NOTE: This sheet is a summary. It may not cover all possible information. If you have questions about this medicine, talk to your doctor, pharmacist, or health care provider.  2024 Elsevier/Gold Standard (2022-05-26 00:00:00) Sumatriptan Injection What is this medication? SUMATRIPTAN (soo ma TRIP tan) treats migraines and cluster headaches. It works by blocking pain signals and narrowing blood vessels in the brain. It belongs to a group of medications called triptans. It is not used to prevent headaches or migraines. This medicine may be used for other purposes; ask your health care provider or pharmacist if you have questions. COMMON BRAND NAME(S): Alsuma, Imitrex, Imitrex STAT dose, Sumavel DosePro System, ZEMBRACE What should I tell my care team before I take this medication? They need to know if you have any of these conditions: Circulation problems in fingers and toes Diabetes Heart disease High blood pressure High cholesterol History of irregular heartbeat History of stroke Kidney disease Liver disease Stomach or intestine problems Tobacco use An unusual or allergic reaction to sumatriptan, latex, other medications, foods, dyes, or preservatives Pregnant or trying to get pregnant Breastfeeding How should I use this medication?  This medication is for injection under the  skin. You will be taught how to prepare and give this medication. Use exactly as directed. Do not take your medication more often than directed. Talk to your care team about the use of this medication in children. Special care may be needed. Overdosage: If you think you have taken too much of this medicine contact a poison control center or emergency room at once. NOTE: This medicine is only for you. Do not share this medicine with others. What if I miss a dose? This does not apply. This medication is not for regular use. What may interact with this medication? Do not take this medication with any of the following: Certain medications for migraine headache, such as almotriptan, eletriptan, frovatriptan, naratriptan, rizatriptan, sumatriptan, zolmitriptan Ergot alkaloids, such as dihydroergotamine, ergonovine, ergotamine, methylergonovine MAOIs, such as Carbex, Eldepryl, Marplan, Nardil, and Parnate This medication may also interact with the following: Certain medications for mental health conditions This list may not describe all possible interactions. Give your health care provider a list of all the medicines, herbs, non-prescription drugs, or dietary supplements you use. Also tell them if you smoke, drink alcohol, or use illegal drugs. Some items may interact with your medicine. What should I watch for while using this medication? Visit your care team for regular checks on your progress. Tell your care team if your symptoms do not start to get better or if they get worse. This medication may affect your coordination, reaction time, or judgment. Do not drive or operate machinery until you know how this medication affects you. Sit up or stand slowly to reduce the risk of dizzy or fainting spells. Drinking alcohol with this medication can increase the risk of these side effects. Tell your care team right away if you have any change in your eyesight. If you take migraine medications for 10 or more  days a month, your migraines may get worse. Keep a diary of headache days and medication use. Contact your care team if your migraine attacks occur more frequently. What side effects may I notice from receiving this medication? Side effects that you should report to your care team as soon as possible: Allergic reactions--skin rash, itching, hives, swelling of the face, lips, tongue, or throat Burning, pain, tingling, or color changes in the legs or feet Heart attack--pain or tightness in the chest, shoulders, arms, or jaw, nausea, shortness of breath, cold or clammy skin, feeling faint or lightheaded Heart rhythm changes--fast or irregular heartbeat, dizziness, feeling faint or lightheaded, chest pain, trouble breathing Increase in blood pressure Raynaud's--cool, numb, or painful fingers or toes that may change color from pale, to blue, to red Seizures Serotonin syndrome--irritability, confusion, fast or irregular heartbeat, muscle stiffness, twitching muscles, sweating, high fever, seizure, chills, vomiting, diarrhea Stroke--sudden numbness or weakness of the face, arm, or leg, trouble speaking, confusion, trouble walking, loss of balance or coordination, dizziness, severe headache, change in vision Sudden or severe stomach pain, nausea, vomiting, fever, or bloody diarrhea Vision loss Side effects that usually do not require medical attention (report to your care team if they continue or are bothersome): Dizziness Facial flushing, redness General discomfort or fatigue This list may not describe all possible side effects. Call your doctor for medical advice about side effects. You may report side effects to FDA at 1-800-FDA-1088. Where should I keep my medication? Keep out of the reach of children and pets. You will be instructed on how to store this medication. Throw away any unused  medication after the expiration date on the label. NOTE: This sheet is a summary. It may not cover all possible  information. If you have questions about this medicine, talk to your doctor, pharmacist, or health care provider.  2024 Elsevier/Gold Standard (2022-09-18 00:00:00)

## 2023-07-24 LAB — COMPREHENSIVE METABOLIC PANEL
ALT: 25 IU/L (ref 0–32)
AST: 22 IU/L (ref 0–40)
Albumin: 4.8 g/dL (ref 3.9–4.9)
Alkaline Phosphatase: 105 IU/L (ref 44–121)
BUN/Creatinine Ratio: 8 — ABNORMAL LOW (ref 9–23)
BUN: 6 mg/dL (ref 6–20)
Bilirubin Total: 0.3 mg/dL (ref 0.0–1.2)
CO2: 20 mmol/L (ref 20–29)
Calcium: 10 mg/dL (ref 8.7–10.2)
Chloride: 103 mmol/L (ref 96–106)
Creatinine, Ser: 0.76 mg/dL (ref 0.57–1.00)
Globulin, Total: 2.4 g/dL (ref 1.5–4.5)
Glucose: 98 mg/dL (ref 70–99)
Potassium: 4.2 mmol/L (ref 3.5–5.2)
Sodium: 140 mmol/L (ref 134–144)
Total Protein: 7.2 g/dL (ref 6.0–8.5)
eGFR: 105 mL/min/{1.73_m2} (ref >59)

## 2023-07-24 LAB — LIPID PANEL
Chol/HDL Ratio: 5.3 ratio — ABNORMAL HIGH (ref 0.0–4.4)
Cholesterol, Total: 189 mg/dL (ref 100–199)
HDL: 36 mg/dL — ABNORMAL LOW (ref >39)
LDL Chol Calc (NIH): 106 mg/dL — ABNORMAL HIGH (ref 0–99)
Triglycerides: 271 mg/dL — ABNORMAL HIGH (ref 0–149)
VLDL Cholesterol Cal: 47 mg/dL — ABNORMAL HIGH (ref 5–40)

## 2023-07-24 LAB — CBC WITH DIFFERENTIAL/PLATELET
Basophils Absolute: 0 10*3/uL (ref 0.0–0.2)
Basos: 0 %
EOS (ABSOLUTE): 0 10*3/uL (ref 0.0–0.4)
Eos: 0 %
Hematocrit: 44 % (ref 34.0–46.6)
Hemoglobin: 14.3 g/dL (ref 11.1–15.9)
Immature Grans (Abs): 0 10*3/uL (ref 0.0–0.1)
Immature Granulocytes: 0 %
Lymphocytes Absolute: 3.1 10*3/uL (ref 0.7–3.1)
Lymphs: 30 %
MCH: 28 pg (ref 26.6–33.0)
MCHC: 32.5 g/dL (ref 31.5–35.7)
MCV: 86 fL (ref 79–97)
Monocytes Absolute: 0.9 10*3/uL (ref 0.1–0.9)
Monocytes: 9 %
Neutrophils Absolute: 6.2 10*3/uL (ref 1.4–7.0)
Neutrophils: 61 %
Platelets: 483 10*3/uL — ABNORMAL HIGH (ref 150–450)
RBC: 5.11 x10E6/uL (ref 3.77–5.28)
RDW: 13.1 % (ref 11.7–15.4)
WBC: 10.3 10*3/uL (ref 3.4–10.8)

## 2023-07-24 LAB — HEMOGLOBIN A1C
Est. average glucose Bld gHb Est-mCnc: 120 mg/dL
Hgb A1c MFr Bld: 5.8 % — ABNORMAL HIGH (ref 4.8–5.6)

## 2023-07-24 LAB — TSH RFX ON ABNORMAL TO FREE T4: TSH: 2.14 u[IU]/mL (ref 0.450–4.500)

## 2023-07-30 ENCOUNTER — Encounter: Payer: Self-pay | Admitting: Physician Assistant

## 2023-08-14 ENCOUNTER — Other Ambulatory Visit: Payer: Self-pay | Admitting: Physician Assistant

## 2023-08-15 ENCOUNTER — Telehealth (HOSPITAL_COMMUNITY): Payer: Self-pay | Admitting: Psychiatry

## 2023-08-15 ENCOUNTER — Telehealth (HOSPITAL_COMMUNITY): Payer: Self-pay

## 2023-08-15 ENCOUNTER — Other Ambulatory Visit (HOSPITAL_COMMUNITY): Payer: Self-pay | Admitting: Psychiatry

## 2023-08-15 MED ORDER — AMPHETAMINE-DEXTROAMPHETAMINE 10 MG PO TABS: 10.0000 mg | ORAL_TABLET | Freq: Every day | ORAL | 0 refills | Status: DC

## 2023-08-15 MED ORDER — AMPHETAMINE-DEXTROAMPHETAMINE 5 MG PO TABS: 5.0000 mg | ORAL_TABLET | Freq: Every day | ORAL | 0 refills | Status: DC

## 2023-08-15 NOTE — Telephone Encounter (Signed)
Patient called requesting refills of:   amphetamine-dextroamphetamine (ADDERALL) 10 MG tablet Last ordered: 07/19/2023 - 30 tablets  amphetamine-dextroamphetamine (ADDERALL) 5 MG table Last ordered: 07/18/2023 - 30 tablets  [id number] - [place], [place] - [address] (Ph: [phone])   Last visit: 03/11/2023  Next visit: 09/09/2023

## 2023-08-15 NOTE — Telephone Encounter (Addendum)
Patient is calling for a refill on her Adderall 10 mg and 5 mg last filled on 8/22 and 8/23 respectively. Last appointment was on 4/15 and patient has a follow up on 10/14 Please review and advise, thank you

## 2023-08-16 MED ORDER — AMPHETAMINE-DEXTROAMPHETAMINE 10 MG PO TABS: 10.0000 mg | ORAL_TABLET | Freq: Every day | ORAL | 0 refills | Status: DC

## 2023-08-16 MED ORDER — AMPHETAMINE-DEXTROAMPHETAMINE 5 MG PO TABS: 5.0000 mg | ORAL_TABLET | Freq: Every day | ORAL | 0 refills | Status: DC

## 2023-08-28 ENCOUNTER — Other Ambulatory Visit: Payer: Self-pay | Admitting: Physician Assistant

## 2023-08-28 DIAGNOSIS — F3289 Other specified depressive episodes: Secondary | ICD-10-CM

## 2023-09-09 ENCOUNTER — Encounter (HOSPITAL_COMMUNITY): Payer: Self-pay | Admitting: Psychiatry

## 2023-09-09 ENCOUNTER — Telehealth (INDEPENDENT_AMBULATORY_CARE_PROVIDER_SITE_OTHER): Payer: 59 | Admitting: Psychiatry

## 2023-09-09 DIAGNOSIS — F411 Generalized anxiety disorder: Secondary | ICD-10-CM | POA: Diagnosis not present

## 2023-09-09 DIAGNOSIS — F5102 Adjustment insomnia: Secondary | ICD-10-CM

## 2023-09-09 DIAGNOSIS — F900 Attention-deficit hyperactivity disorder, predominantly inattentive type: Secondary | ICD-10-CM

## 2023-09-09 MED ORDER — AMPHETAMINE-DEXTROAMPHETAMINE 10 MG PO TABS: 10.0000 mg | ORAL_TABLET | Freq: Every day | ORAL | 0 refills | Status: DC

## 2023-09-09 MED ORDER — AMPHETAMINE-DEXTROAMPHETAMINE 5 MG PO TABS: 5.0000 mg | ORAL_TABLET | Freq: Every day | ORAL | 0 refills | Status: DC

## 2023-09-09 NOTE — Progress Notes (Signed)
[facility] Follow up visit  Patient Identification: Miranda Hunter MRN:  [id number] Date of Evaluation:  09/09/2023  Chief complaint: follow up adhd Referral Source: primary care Visit Diagnosis:    ICD-10-CM   1. GAD (generalized anxiety disorder)  F41.1     2. Adjustment insomnia  F51.02     3. Attention deficit hyperactivity disorder (ADHD), predominantly inattentive type  F90.0     Virtual Visit via Video Note  I connected with Miranda Hunter on 09/09/23 at 10:00 AM EDT by a video enabled telemedicine application and verified that I am speaking with the correct person using two identifiers.  Location: Patient: home Provider: home office   I discussed the limitations of evaluation and management by telemedicine and the availability of in person appointments. The patient expressed understanding and agreed to proceed.      I discussed the assessment and treatment plan with the patient. The patient was provided an opportunity to ask questions and all were answered. The patient agreed with the plan and demonstrated an understanding of the instructions.   The patient was advised to call back or seek an in-person evaluation if the symptoms worsen or if the condition fails to improve as anticipated.  I provided 20 minutes of non-face-to-face time during this encounter.      History of Present Illness: 37 years old single White female referred by PCP for management of inattention.  Doing fair in regard to depression, anxiety,  Also on weight loss anti diabetic med helping . Lost 30lbs in 8 months Feeling better  No palpiations,  Attention manageable on adderall 10 plus 5   Aggravating factors; history of ADHD.  Grandparent death Modifying factors; her dog, job , family   Duration ADHD since young age;  Severity better        Past Psychiatric History: anxiety, inattention  Previous Psychotropic Medications: Yes   Substance Abuse History in the last 12 months:   No.  Consequences of Substance Abuse: NA  Past Medical History:  Past Medical History:  Diagnosis Date   Anxiety    Asthma    History of chicken pox    IBS (irritable bowel syndrome)    Migraines     Past Surgical History:  Procedure Laterality Date   tonsillectomy and adnoidectomy Bilateral 2007    Family Psychiatric History: denies  Family History:  Family History  Problem Relation Age of Onset   Migraines Mother    Prostate cancer Father    Hypertension Father    Asthma Sister    Lung cancer Maternal Grandmother        heavy smoker   Migraines Maternal Grandmother    Lung cancer Maternal Grandfather        heavy smoker   Asthma Sister     Social History:   Social History   Socioeconomic History   Marital status: Single    Spouse name: Not on file   Number of children: 0   Years of education: Not on file   Highest education level: Bachelor's degree (e.g., BA, AB, BS)  Occupational History   Not on file  Tobacco Use   Smoking status: Never   Smokeless tobacco: Never  Vaping Use   Vaping status: Never Used  Substance and Sexual Activity   Alcohol use: Yes    Comment: socially   Drug use: No   Sexual activity: Not Currently    Comment: kyleena  Other Topics Concern   Not on file  Social History Narrative  She is a Psychiatric nurseSam"   Recently got a new puppy- a golden retriever named [name]   Caffeine once a week      Social Determinants of Corporate investment banker Strain: Not on file  Food Insecurity: Not on file  Transportation Needs: Not on file  Physical Activity: Not on file  Stress: Not on file  Social Connections: Not on file     Allergies:   Allergies  Allergen Reactions   Amoxicillin-Pot Clavulanate Diarrhea    Metabolic Disorder Labs: Lab Results  Component Value Date   HGBA1C 5.8 (H) 07/23/2023   No results found for: "PROLACTIN" Lab Results  Component Value Date   CHOL 189 07/23/2023   TRIG 271 (H) 07/23/2023    HDL 36 (L) 07/23/2023   CHOLHDL 5.3 (H) 07/23/2023   VLDL 69.4 (H) 11/12/2022   LDLCALC 106 (H) 07/23/2023   LDLCALC 129 (H) 08/24/2021   Lab Results  Component Value Date   TSH 2.140 07/23/2023    Therapeutic Level Labs: No results found for: "LITHIUM" No results found for: "CBMZ" No results found for: "VALPROATE"  Current Medications: Current Outpatient Medications  Medication Sig Dispense Refill   albuterol (VENTOLIN HFA) 108 (90 Base) MCG/ACT inhaler Inhale 2 puffs into the lungs every 6 (six) hours as needed for wheezing or shortness of breath. 18 g 2   amphetamine-dextroamphetamine (ADDERALL) 10 MG tablet Take 1 tablet (10 mg total) by mouth daily. 30 tablet 0   amphetamine-dextroamphetamine (ADDERALL) 5 MG tablet Take 1 tablet (5 mg total) by mouth daily. This is in addition to 10mg  . Take 10mg  in the am and this 5mg  at noon 30 tablet 0   cetirizine (ZYRTEC) 10 MG tablet Take 10 mg by mouth daily.     escitalopram (LEXAPRO) 20 MG tablet TAKE 1 TABLET BY MOUTH EVERY DAY 90 tablet 0   fluticasone (FLONASE) 50 MCG/ACT nasal spray      medroxyPROGESTERone Acetate 150 MG/ML SUSY Inject 1 mL every 3 months by intramuscular route.     Multiple Vitamin (MULTIVITAMIN) tablet Take 1 tablet by mouth daily.     ondansetron (ZOFRAN-ODT) 4 MG disintegrating tablet TAKE 1-2 TABLETS BY MOUTH EVERY 8 HOURS AS NEEDED FOR NAUSEA. 30 tablet 1   promethazine (PHENERGAN) 25 MG tablet Take 0.5 tablets (12.5 mg total) by mouth every 8 (eight) hours as needed for nausea or vomiting. 20 tablet 11   SUMAtriptan 6 MG/0.5ML SOAJ Inject 6 mg into the skin every 2 (two) hours. 3 mL 11   tirzepatide (MOUNJARO) 5 MG/0.5ML Pen Inject 7.5 mg into the skin once a week.     traZODone (DESYREL) 150 MG tablet TAKE 1 TABLET BY MOUTH AT BEDTIME. 30 tablet 2   Ubrogepant (UBRELVY) 50 MG TABS Take 1 tablet daily as needed for migraine. May take a second tablet after two hours if needed. 12 tablet 0   No current  facility-administered medications for this visit.      Psychiatric Specialty Exam: Review of Systems  Cardiovascular:  Negative for chest pain.  Neurological:  Negative for weakness.  Psychiatric/Behavioral:  Negative for agitation and decreased concentration.     There were no vitals taken for this visit.There is no height or weight on file to calculate BMI.  General Appearance: Casual  Eye Contact:  Fair  Speech:  Slow  Volume:  Normal  Mood:fair  Affect:  Congruent  Thought Process:  Goal Directed  Orientation:  Full (Time, Place, and  Person)  Thought Content:  Logical  Suicidal Thoughts:  No  Homicidal Thoughts:  No  Memory:  Immediate;   Fair Recent;   Fair  Judgement:  Fair  Insight:  Fair  Psychomotor Activity:  Normal  Concentration:  Concentration: Fair and Attention Span: variable  Recall:  Good  Fund of Knowledge:Good  Language: Good  Akathisia:  No  Handed:   AIMS (if indicated):  not done  Assets:  Desire for Improvement Housing  ADL's:  Intact  Cognition: WNL  Sleep:  Fair   Screenings: PHQ2-9    Flowsheet Row Video Visit from 08/13/2022 in [facility] from 12/03/2018 in [facility] Visit from 09/03/2017 in [facility]  PHQ-2 Total Score 0 5 0  PHQ-9 Total Score -- 13 --      Flowsheet Row Video Visit from 06/22/2022 in [facility] Video Visit from 02/16/2022 in [facility] at [facility] Video Visit from 11/10/2021 in [facility]  C-SSRS RISK CATEGORY No Risk No Risk No Risk       Assessment and Plan: as follows  Prior documentation reviewed    ADHD : manageable continue adderall ,  Generalized anxiety disorder;manageable conitnue lexapro from pcp   Insomnia: on trazadone now prn , helps  when needed, can continue  Fu 60m.   [name], MD 10/14/202410:06 AM

## 2023-10-02 ENCOUNTER — Ambulatory Visit: Payer: 59 | Admitting: Neurology

## 2023-10-11 ENCOUNTER — Telehealth (HOSPITAL_COMMUNITY): Payer: Self-pay | Admitting: *Deleted

## 2023-10-11 MED ORDER — AMPHETAMINE-DEXTROAMPHETAMINE 10 MG PO TABS: 10.0000 mg | ORAL_TABLET | Freq: Every day | ORAL | 0 refills | Status: DC

## 2023-10-11 MED ORDER — AMPHETAMINE-DEXTROAMPHETAMINE 5 MG PO TABS: 5.0000 mg | ORAL_TABLET | Freq: Every day | ORAL | 0 refills | Status: DC

## 2023-10-11 NOTE — Addendum Note (Signed)
Addended by: [name] on: 10/11/2023 08:42 AM   Modules accepted: Orders

## 2023-10-11 NOTE — Telephone Encounter (Signed)
PATIENT REFILL REQUEST amphetamine-dextroamphetamine (ADDERALL) 10 MG tablet               && amphetamine-dextroamphetamine (ADDERALL) 5 MG tablet   [id number] - [place], [place] - [address]   Next Appt 01/20/24 Last Appt  09/09/23

## 2023-10-16 ENCOUNTER — Encounter: Payer: Self-pay | Admitting: Physician Assistant

## 2023-10-17 ENCOUNTER — Ambulatory Visit (INDEPENDENT_AMBULATORY_CARE_PROVIDER_SITE_OTHER): Payer: 59

## 2023-10-17 DIAGNOSIS — Z23 Encounter for immunization: Secondary | ICD-10-CM | POA: Diagnosis not present

## 2023-10-17 NOTE — Progress Notes (Signed)
Patient is in office today for a nurse visit for  Flu vaccine , per PCP's order. Patient Injection was given in the  Left deltoid. Patient tolerated injection well.

## 2023-11-07 ENCOUNTER — Telehealth (HOSPITAL_COMMUNITY): Payer: Self-pay | Admitting: Psychiatry

## 2023-11-07 MED ORDER — AMPHETAMINE-DEXTROAMPHETAMINE 10 MG PO TABS: 10.0000 mg | ORAL_TABLET | Freq: Every day | ORAL | 0 refills | Status: DC

## 2023-11-07 MED ORDER — AMPHETAMINE-DEXTROAMPHETAMINE 5 MG PO TABS: 5.0000 mg | ORAL_TABLET | Freq: Every day | ORAL | 0 refills | Status: DC

## 2023-11-07 NOTE — Telephone Encounter (Signed)
Pt calling.  Needs refill on Adderall 5mg  and 10mg  [facility]  Next Visit -4/14 Last Visit -10/14

## 2023-11-09 ENCOUNTER — Other Ambulatory Visit: Payer: Self-pay | Admitting: Physician Assistant

## 2023-11-11 ENCOUNTER — Ambulatory Visit: Payer: 59 | Admitting: Internal Medicine

## 2023-11-11 ENCOUNTER — Encounter: Payer: Self-pay | Admitting: Internal Medicine

## 2023-11-11 VITALS — BP 119/84 | HR 103 | Temp 98.5°F | Ht 69.0 in | Wt 248.0 lb

## 2023-11-11 DIAGNOSIS — G4709 Other insomnia: Secondary | ICD-10-CM

## 2023-11-11 DIAGNOSIS — J328 Other chronic sinusitis: Secondary | ICD-10-CM | POA: Diagnosis not present

## 2023-11-11 MED ORDER — TRAZODONE HCL 150 MG PO TABS: 150.0000 mg | ORAL_TABLET | Freq: Every day | ORAL | 2 refills | Status: DC

## 2023-11-11 MED ORDER — SIMPLY SALINE 0.9 % NA AERS: 2.0000 | INHALATION_SPRAY | NASAL | 11 refills | Status: AC

## 2023-11-11 MED ORDER — HYDROCOD POLI-CHLORPHE POLI ER 10-8 MG/5ML PO SUER: 5.0000 mL | Freq: Two times a day (BID) | ORAL | 0 refills | Status: DC | PRN

## 2023-11-11 MED ORDER — DOXYCYCLINE HYCLATE 100 MG PO TABS: 100.0000 mg | ORAL_TABLET | Freq: Two times a day (BID) | ORAL | 0 refills | Status: AC

## 2023-11-11 NOTE — Assessment & Plan Note (Signed)
Follow-up We will send a prescription for trazodone, advise monitoring for serotonin syndrome due to high doses of trazodone and another medication, and [facility]

## 2023-11-11 NOTE — Patient Instructions (Signed)
VISIT SUMMARY:  You came in today with symptoms of severe sinusitis, including sinus pressure, congestion, and a persistent cough that has been disrupting your sleep. We discussed your history of frequent sinus infections and allergies, which may be contributing to your current condition.  YOUR PLAN:  -SEVERE SINUSITIS: Severe sinusitis is an inflammation of the sinuses that can cause pressure and pain in the forehead, cheeks, and under the ears, often due to infection. We will treat this with doxycycline, a saline misting spray, and recommend a daily regimen including Flonase Sensimist, Sudafed, ipratropium spray, saline sprays, Claritin or Allegra, and hot showers or steam treatments to help with nasal drainage. We may also refer you to an ENT specialist for further evaluation.  -POSTNASAL DRIP COUGH: Postnasal drip cough is a cough caused by mucus draining down the back of the throat, often due to sinusitis. To manage this, we will prescribe Tussionex (hydrocodone, chlorpheniramine) to help control your cough, especially at night.  -GENERAL HEALTH MAINTENANCE: For general health maintenance, we recommend using Simply Saline spray daily before bed to help keep your nasal passages clear.  INSTRUCTIONS:  We will send your prescriptions for doxycycline, Tussionex, and [facility]. Please monitor for any signs of serotonin syndrome due to the high doses of trazodone and another medication you are taking. If your symptoms do not improve or worsen, please schedule a follow-up appointment.  It was a pleasure seeing you today! Your health and satisfaction are our top priorities.   Glenetta Hew, MD  Next Steps:  [x]  Flexible Follow-Up: We recommend a follow up with your primary care, a specialist, or me within 1-2 weeks for ensuring your problem resolves. This allows for progress monitoring and treatment adjustments. [x]  Early Intervention: Schedule sooner appointment, call our on-call  services, or go to emergency room if there is Increase in pain or discomfort New or worsening symptoms Sudden or severe changes in your health [x]  Lab & X-ray Appointments: complete or schedule to complete today, or call to schedule.  X-rays: [facility] (M-F, 8:30am-noon or 1pm-5pm).  Making the Most of Our Focused (20 minute) Follow Up Appointments:  [x]   Clearly state your top concerns at the beginning of the visit to focus our discussion [x]   If you anticipate you will need more time, please inform the front desk during scheduling - we can book multiple appointments in the same week. [x]   If you have transportation problems- use our convenient video appointments or ask about transportation support. [x]   We can get down to business faster if you use MyChart to update information before the visit and submit non-urgent questions before your visit. Thank you for taking the time to provide details through MyChart.  Let our nurse know and she can import this information into your encounter documents.  Arrival and Wait Times: [x]   Arriving on time ensures that everyone receives prompt attention. [x]   Early morning (8a) and afternoon (1p) appointments tend to have shortest wait times. [x]   Unfortunately, we cannot delay appointments for late arrivals or hold slots during phone calls.  Getting Answers and Following Up  [x]   Simple Questions & Concerns: For quick questions or basic follow-up after your visit, reach [place] at [phone] or MyChart messaging. [x]   Complex Concerns: If your concern is more complex, scheduling an appointment might be best. Discuss this with the staff to find the most suitable option. [x]   Lab & Imaging Results: We'll contact you directly if results  are abnormal or you don't use MyChart. Most normal results will be on MyChart within 2-3 business days, with a review message from Dr. Jon Billings. Haven't heard back in 2 weeks? Need results sooner? Contact us  at [phone]. [x]   Referrals: Our referral coordinator will manage specialist referrals. The specialist's office should contact you within 2 weeks to schedule an appointment. Call us if you haven't heard from them after 2 weeks.  Staying Connected  [x]   MyChart: Activate your MyChart for the fastest way to access results and message [place]. See the last page of this paperwork for instructions on how to activate.  Bring to Your Next Appointment  [x]   Medications: Please bring all your medication bottles to your next appointment to ensure we have an accurate record of your prescriptions. [x]   Health Diaries: If you're monitoring any health conditions at home, keeping a diary of your readings can be very helpful for discussions at your next appointment.  Billing  [x]   X-ray & Lab Orders: These are billed by separate companies. Contact the invoicing company directly for questions or concerns. [x]   Visit Charges: Discuss any billing inquiries with our administrative services team.  Your Satisfaction Matters  [x]   Share Your Experience: We strive for your satisfaction! If you have any complaints, or preferably compliments, please let Dr. Jon Billings know directly or contact our Practice Administrators, Edwena Felty or [employer], by asking at the front desk.   Reviewing Your Records  [x]   Review this early draft of your clinical encounter notes below and the final encounter summary tomorrow on MyChart after its been completed.   Other chronic sinusitis -     Simply Saline; Place 2 each into the nose as directed. Use nightly for sinus hygiene long-term.  Can also be used as many times daily as desired to assist with clearing congested sinuses.  Dispense: 127 mL; Refill: 11 -     Doxycycline Hyclate; Take 1 tablet (100 mg total) by mouth 2 (two) times daily.  Dispense: 20 tablet; Refill: 0 -     Hydrocod Poli-Chlorphe Poli ER; Take 5 mLs by mouth every 12 (twelve) hours as needed for cough.   Dispense: 120 mL; Refill: 0  Other insomnia -     traZODone HCl; Take 1 tablet (150 mg total) by mouth at bedtime.  Dispense: 30 tablet; Refill: 2

## 2023-11-11 NOTE — Progress Notes (Signed)
[facility]: [phone]   -- Medical Office Visit --  Patient:  Miranda Hunter      Age: 37 y.o.       Sex:  female  Date:   11/11/2023 Today's Healthcare Provider: Lula Olszewski, MD  ==========================================================================       Assessment & Plan Other chronic sinusitis Severe Sinusitis They exhibit severe sinusitis symptoms for seven days, including pressure across the forehead, cheeks, under the ears, and dental pain, suggesting a bacterial cause, though a viral origin cannot be excluded. Small nasal airways and poor drainage likely lead to recurrent infections, with allergies possibly worsening the condition. We discussed the 50% likelihood of bacterial versus viral etiology and the potential benefits and risks of antibiotics, including a small chance of yeast infections. We emphasized the importance of a comprehensive sinus toilet regimen to improve nasal drainage and reduce infection frequency. We will prescribe doxycycline and a saline misting spray, recommend daily use of Flonase Sensimist, and advise on a sinus toilet regimen including Sudafed, ipratropium spray, saline sprays, Claritin or Allegra, and Flonase. We also recommend hot showers or steam treatment to aid nasal drainage and consider a referral to ENT for a potential coblation procedure.  Postnasal Drip Cough Their persistent cough is likely secondary to postnasal drip from sinusitis, with symptoms including nocturnal coughing and sleep disturbance. We discussed the effectiveness of Tussionex (hydrocodone, chlorpheniramine) for managing postnasal drip cough and the potential difficulty in obtaining it due to insurance issues. We will prescribe Tussionex (hydrocodone, chlorpheniramine) for cough management.   General Health Maintenance We recommend daily use of Simply Saline spray before bed. Other insomnia Follow-up We will send a prescription  for trazodone, advise monitoring for serotonin syndrome due to high doses of trazodone and another medication, and [facility].     Orders Placed During this Encounter:   ED Discharge Orders          Ordered    Saline (SIMPLY SALINE) 0.9 % AERS  As directed        11/11/23 1205    doxycycline (VIBRA-TABS) 100 MG tablet  2 times daily        11/11/23 1205    chlorpheniramine-HYDROcodone (TUSSIONEX) 10-8 MG/5ML  Every 12 hours PRN        11/11/23 1205    traZODone (DESYREL) 150 MG tablet  Daily at bedtime        11/11/23 1205          Diagnoses and all orders for this visit: Other chronic sinusitis -     Saline (SIMPLY SALINE) 0.9 % AERS; Place 2 each into the nose as directed. Use nightly for sinus hygiene long-term.  Can also be used as many times daily as desired to assist with clearing congested sinuses. -     doxycycline (VIBRA-TABS) 100 MG tablet; Take 1 tablet (100 mg total) by mouth 2 (two) times daily. -     chlorpheniramine-HYDROcodone (TUSSIONEX) 10-8 MG/5ML; Take 5 mLs by mouth every 12 (twelve) hours as needed for cough. Other insomnia -     traZODone (DESYREL) 150 MG tablet; Take 1 tablet (150 mg total) by mouth at bedtime.  Recommended follow-up: No follow-ups on file. Future Appointments  Date Time Provider Department Center  01/20/2024  9:30 AM Anson Fret, MD [facility] None  03/09/2024 10:00 AM [name], MD [facility] None  Patient Care Team: Jarold Motto, [place] as PCP - General (Physician Assistant)  SUBJECTIVE: 37 y.o. female who has Moderate persistent asthma; Migraine with aura and without status migrainosus, not intractable; Obesity (BMI 30-39.9); Depression; Insulin resistance; Cervical intraepithelial neoplasia grade 1; Transaminitis; Hyperlipidemia, mixed; Other insomnia; Attention deficit hyperactivity disorder (ADHD); and Vitamin D deficiency on their problem list.  Main reasons for visit/main concerns/chief  complaint: Possible sinus infection (Symptoms started about a week.), Productive cough (Producing lime green mucus.), and Ear pressure    AI-Extracted: Discussed the use of AI scribe software for clinical note transcription with the patient, who gave verbal consent to proceed.  History of Present Illness The patient, with a history of small nasal airways and frequent sinus infections, presents with symptoms that began seven days ago. The onset was characterized by a sore throat and nasal drip, which progressed to congestion and developed into a cough. The patient reports a pattern of similar episodes typically progressing to bronchitis or sinus infection. Despite initial improvement, the patient experienced a worsening of symptoms over the weekend, characterized by persistent coughing and sleep disruption due to postnasal drip. The patient now reports significant sinus pressure, particularly across the forehead, cheeks, and under the ears, with associated toothache.  The patient denies any dental issues and has no known thyroid problems. 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 The patient reports a history of allergies, which may contribute to their sinus issues.  The patient's cough is likely due to postnasal drip from sinus congestion. The patient denies any shortness of breath, suggesting the issue is not originating from the lungs. The patient has been managing symptoms with over-the-counter medications, but is seeking further treatment due to the severity and duration of the current episode.  The patient has a history of two sinus infections per year, typically occurring at the end of the year and the beginning of the year. The patient reports no new medications and is currently taking Trazodone. The patient has previously been prescribed cough syrup for similar symptoms.   Note that patient  has a past medical  history of Anxiety, Asthma, History of chicken pox, IBS (irritable bowel syndrome), and Migraines. Also we discussed she has a past medical history of  - Bronchitis - Sinus infection - Allergies - Tonsillectomy and adenoidectomy  Problem list overviews that were updated at today's visit:No problems updated.  Med reconciliation: Current Outpatient Medications on File Prior to Visit  Medication Sig   albuterol (VENTOLIN HFA) 108 (90 Base) MCG/ACT inhaler Inhale 2 puffs into the lungs every 6 (six) hours as needed for wheezing or shortness of breath.   amphetamine-dextroamphetamine (ADDERALL) 10 MG tablet Take 1 tablet (10 mg total) by mouth daily.   amphetamine-dextroamphetamine (ADDERALL) 5 MG tablet Take 1 tablet (5 mg total) by mouth daily. This is in addition to 10mg  . Take 10mg  in the am and this 5mg  at noon   cetirizine (ZYRTEC) 10 MG tablet Take 10 mg by mouth daily.   escitalopram (LEXAPRO) 20 MG tablet TAKE 1 TABLET BY MOUTH EVERY DAY   fluticasone (FLONASE) 50 MCG/ACT nasal spray    medroxyPROGESTERone Acetate 150 MG/ML SUSY Inject 1 mL every 3 months by intramuscular route.   Multiple Vitamin (MULTIVITAMIN) tablet Take 1 tablet by mouth daily.   ondansetron (ZOFRAN-ODT) 4 MG disintegrating tablet TAKE 1-2 TABLETS BY MOUTH EVERY 8 HOURS AS NEEDED FOR NAUSEA.   promethazine (PHENERGAN) 25 MG tablet Take 0.5 tablets (12.5 mg total) by mouth every 8 (eight)  hours as needed for nausea or vomiting.   SUMAtriptan 6 MG/0.5ML SOAJ Inject 6 mg into the skin every 2 (two) hours.   tirzepatide [facility]) 5 MG/0.5ML Pen Inject 7.5 mg into the skin once a week.   Ubrogepant (UBRELVY) 50 MG TABS Take 1 tablet daily as needed for migraine. May take a second tablet after two hours if needed.   No current facility-administered medications on file prior to visit.   Medications Discontinued During This Encounter  Medication Reason   traZODone (DESYREL) 150 MG tablet Reorder     Objective    Physical Exam     11/11/2023   11:31 AM 07/23/2023    8:29 AM 06/25/2023    7:54 AM  Vitals with BMI  Height 5\' 9"  5\' 9"  5\' 9"   Weight 248 lbs 253 lbs 3 oz 258 lbs  BMI 36.61 37.37 38.08  Systolic 119 117   Diastolic 84 84   Pulse 103 110    Wt Readings from Last 10 Encounters:  11/11/23 248 lb (112.5 kg)  07/23/23 253 lb 3.2 oz (114.9 kg)  06/25/23 258 lb (117 kg)  02/01/23 275 lb (124.7 kg)  11/12/22 275 lb 6.1 oz (124.9 kg)  10/11/22 274 lb 9.6 oz (124.6 kg)  08/22/22 275 lb (124.7 kg)  08/13/22 271 lb (122.9 kg)  08/09/22 271 lb 6.4 oz (123.1 kg)  12/19/21 265 lb (120.2 kg)   Vital signs reviewed.  Nursing notes reviewed. Weight trend reviewed. Abnormalities and Problem-Specific physical exam findings:   HEENT: Uvula deviated to the left. Tonsils previously removed, scarred on one side. Uveal palatal arch with significant drainage. Nasal airways small, not significantly swollen. NECK: Tenderness and warmth over the mastoid process. CHEST: Lungs clear to auscultation. CARDIOVASCULAR: Tachycardia.  General Appearance:  No acute distress appreciable.   Well-groomed, healthy-appearing female.  Well proportioned with no abnormal fat distribution.  Good muscle tone. Pulmonary:  Normal work of breathing at rest, no respiratory distress apparent. SpO2: 97 %  Musculoskeletal: All extremities are intact.  Neurological:  Awake, alert, oriented, and engaged.  No obvious focal neurological deficits or cognitive impairments.  Sensorium seems unclouded.   Speech is clear and coherent with logical content. Psychiatric:  Appropriate mood, pleasant and cooperative demeanor, thoughtful and engaged during the exam  Results            No results found for any visits on 11/11/23.  Scanned Document on 10/21/2023  Component Date Value   HM Pap smear 03/19/2022 NILM    HPV, high-risk 03/19/2022 Negative   Office Visit on 07/23/2023  Component Date Value   WBC 07/23/2023 10.3    RBC  07/23/2023 5.11    Hemoglobin 07/23/2023 14.3    Hematocrit 07/23/2023 44.0    MCV 07/23/2023 86    MCH 07/23/2023 28.0    MCHC 07/23/2023 32.5    RDW 07/23/2023 13.1    Platelets 07/23/2023 483 (H)    Neutrophils 07/23/2023 61    Lymphs 07/23/2023 30    Monocytes 07/23/2023 9    Eos 07/23/2023 0    Basos 07/23/2023 0    Neutrophils Absolute 07/23/2023 6.2    Lymphocytes Absolute 07/23/2023 3.1    Monocytes Absolute 07/23/2023 0.9    EOS (ABSOLUTE) 07/23/2023 0.0    Basophils Absolute 07/23/2023 0.0    Immature Granulocytes 07/23/2023 0    Immature Grans (Abs) 07/23/2023 0.0    Glucose 07/23/2023 98    BUN 07/23/2023 6    Creatinine, Ser 07/23/2023  0.76    eGFR 07/23/2023 105    BUN/Creatinine Ratio 07/23/2023 8 (L)    Sodium 07/23/2023 140    Potassium 07/23/2023 4.2    Chloride 07/23/2023 103    CO2 07/23/2023 20    Calcium 07/23/2023 10.0    Total Protein 07/23/2023 7.2    Albumin 07/23/2023 4.8    Globulin, Total 07/23/2023 2.4    Bilirubin Total 07/23/2023 0.3    Alkaline Phosphatase 07/23/2023 105    AST 07/23/2023 22    ALT 07/23/2023 25    TSH 07/23/2023 2.140    Cholesterol, Total 07/23/2023 189    Triglycerides 07/23/2023 271 (H)    HDL 07/23/2023 36 (L)    VLDL Cholesterol Cal 07/23/2023 47 (H)    LDL Chol Calc (NIH) 07/23/2023 106 (H)    Chol/HDL Ratio 07/23/2023 5.3 (H)    Hgb A1c MFr Bld 07/23/2023 5.8 (H)    Est. average glucose Bld* 07/23/2023 120   Office Visit on 11/12/2022  Component Date Value   Hemoglobin A1C 11/12/2022 5.9 (A)    WBC 11/12/2022 10.2    RBC 11/12/2022 4.60    Hemoglobin 11/12/2022 13.4    HCT 11/12/2022 40.0    MCV 11/12/2022 87.0    MCHC 11/12/2022 33.5    RDW 11/12/2022 13.7    Platelets 11/12/2022 481.0 (H)    Neutrophils Relative % 11/12/2022 59.3    Lymphocytes Relative 11/12/2022 32.3    Monocytes Relative 11/12/2022 6.8    Eosinophils Relative 11/12/2022 1.1    Basophils Relative 11/12/2022 0.5    Neutro Abs  11/12/2022 6.1    Lymphs Abs 11/12/2022 3.3    Monocytes Absolute 11/12/2022 0.7    Eosinophils Absolute 11/12/2022 0.1    Basophils Absolute 11/12/2022 0.1    Sodium 11/12/2022 136    Potassium 11/12/2022 4.1    Chloride 11/12/2022 101    CO2 11/12/2022 23    Glucose, Bld 11/12/2022 125 (H)    BUN 11/12/2022 7    Creatinine, Ser 11/12/2022 0.71    Total Bilirubin 11/12/2022 0.6    Alkaline Phosphatase 11/12/2022 93    AST 11/12/2022 25    ALT 11/12/2022 34    Total Protein 11/12/2022 6.9    Albumin 11/12/2022 4.5    GFR 11/12/2022 111.17    Calcium 11/12/2022 9.7    Cholesterol 11/12/2022 167    Triglycerides 11/12/2022 347.0 (H)    HDL 11/12/2022 40.00    VLDL 11/12/2022 69.4 (H)    Total CHOL/HDL Ratio 11/12/2022 4    NonHDL 11/12/2022 126.55    Direct LDL 11/12/2022 92.0    No image results found.   No results found.  Patient was never admitted.       Additional Info: This encounter employed real-time, collaborative documentation. The patient actively reviewed and updated their medical record on a shared screen, ensuring transparency and facilitating joint problem-solving for the problem list, overview, and plan. This approach promotes accurate, informed care. The treatment plan was discussed and reviewed in detail, including medication safety, potential side effects, and all patient questions. We confirmed understanding and comfort with the plan. Follow-up instructions were established, including contacting the office for any concerns, returning if symptoms worsen, persist, or new symptoms develop, and precautions for potential emergency department visits.

## 2023-11-11 NOTE — Telephone Encounter (Signed)
Patient called stating pharmacy informed her there is a shortage of the cough medication that was sent in. Patient is hoping another medication can be sent so she can take it tonight.

## 2023-11-12 ENCOUNTER — Encounter: Payer: Self-pay | Admitting: Internal Medicine

## 2023-11-12 ENCOUNTER — Other Ambulatory Visit: Payer: Self-pay

## 2023-11-12 DIAGNOSIS — J328 Other chronic sinusitis: Secondary | ICD-10-CM

## 2023-11-12 MED ORDER — HYDROCOD POLI-CHLORPHE POLI ER 10-8 MG/5ML PO SUER: 5.0000 mL | Freq: Two times a day (BID) | ORAL | 0 refills | Status: AC | PRN

## 2023-11-12 NOTE — Telephone Encounter (Signed)
Pt found a pharmacy that does have the RX - [facility] - [address]

## 2023-11-12 NOTE — Addendum Note (Signed)
Addended by: Lula Olszewski on: 11/12/2023 04:02 PM   Modules accepted: Orders

## 2023-12-01 ENCOUNTER — Other Ambulatory Visit: Payer: Self-pay | Admitting: Physician Assistant

## 2023-12-01 DIAGNOSIS — F3289 Other specified depressive episodes: Secondary | ICD-10-CM

## 2023-12-03 ENCOUNTER — Encounter: Payer: Self-pay | Admitting: Physician Assistant

## 2023-12-06 ENCOUNTER — Telehealth (HOSPITAL_COMMUNITY): Payer: Self-pay | Admitting: *Deleted

## 2023-12-06 MED ORDER — AMPHETAMINE-DEXTROAMPHETAMINE 5 MG PO TABS: 5.0000 mg | ORAL_TABLET | Freq: Every day | ORAL | 0 refills | Status: DC

## 2023-12-06 MED ORDER — AMPHETAMINE-DEXTROAMPHETAMINE 10 MG PO TABS: 10.0000 mg | ORAL_TABLET | Freq: Every day | ORAL | 0 refills | Status: DC

## 2023-12-06 NOTE — Addendum Note (Signed)
 Addended by: [name] on: 12/06/2023 09:18 AM   Modules accepted: Orders

## 2023-12-06 NOTE — Telephone Encounter (Signed)
 REFILL REQUEST  [id number] - [place], [place] - [address]      amphetamine-dextroamphetamine (ADDERALL) 5 MG tablet   amphetamine-dextroamphetamine (ADDERALL) 10 MG tablet   Next Appt 01/20/24 Last Appt  09/09/23

## 2023-12-12 ENCOUNTER — Other Ambulatory Visit: Payer: Self-pay | Admitting: Physician Assistant

## 2024-01-02 ENCOUNTER — Telehealth (HOSPITAL_COMMUNITY): Payer: Self-pay | Admitting: *Deleted

## 2024-01-02 NOTE — Telephone Encounter (Signed)
 REFILL REQUEST amphetamine -dextroamphetamine  (ADDERALL) 5 MG  &10 MG tablet   [id number] - [place], [place] - [address]   Next Appt 01/20/24 Last Appt  09/09/23

## 2024-01-03 MED ORDER — AMPHETAMINE-DEXTROAMPHETAMINE 5 MG PO TABS: 5.0000 mg | ORAL_TABLET | Freq: Every day | ORAL | 0 refills | Status: DC

## 2024-01-03 NOTE — Addendum Note (Signed)
 Addended by: Clair Alfieri on: 01/03/2024 08:46 AM   Modules accepted: Orders

## 2024-01-06 ENCOUNTER — Telehealth (HOSPITAL_COMMUNITY): Payer: Self-pay | Admitting: *Deleted

## 2024-01-06 MED ORDER — AMPHETAMINE-DEXTROAMPHETAMINE 10 MG PO TABS: 10.0000 mg | ORAL_TABLET | Freq: Every day | ORAL | 0 refills | Status: DC

## 2024-01-06 NOTE — Addendum Note (Signed)
 Addended by: Marilouise Densmore on: 01/06/2024 09:00 AM   Modules accepted: Orders

## 2024-01-06 NOTE — Telephone Encounter (Signed)
 Rx hasn't received refill request for  amphetamine -dextroamphetamine  (ADDERALL) 10 MG tablet

## 2024-01-20 ENCOUNTER — Telehealth (INDEPENDENT_AMBULATORY_CARE_PROVIDER_SITE_OTHER): Payer: 59 | Admitting: Neurology

## 2024-01-20 DIAGNOSIS — G43821 Menstrual migraine, not intractable, with status migrainosus: Secondary | ICD-10-CM

## 2024-01-20 DIAGNOSIS — G43109 Migraine with aura, not intractable, without status migrainosus: Secondary | ICD-10-CM

## 2024-01-22 ENCOUNTER — Telehealth: Payer: Self-pay | Admitting: Neurology

## 2024-01-22 MED ORDER — UBRELVY 50 MG PO TABS: ORAL_TABLET | ORAL | 0 refills | Status: AC

## 2024-01-22 MED ORDER — PROMETHAZINE HCL 25 MG PO TABS: 12.5000 mg | ORAL_TABLET | Freq: Three times a day (TID) | ORAL | 11 refills | Status: AC | PRN

## 2024-01-22 MED ORDER — SUMATRIPTAN SUCCINATE 6 MG/0.5ML ~~LOC~~ SOAJ: 6.0000 mg | SUBCUTANEOUS | 11 refills | Status: AC

## 2024-01-22 NOTE — Progress Notes (Signed)
 [facility]    Provider:  Dr Lucia Gaskins Referring Provider: Jarold Motto, PA Primary Care Physician:  Jarold Motto, PA  Virtual Visit via Video Note  I connected with Miranda Hunter on 01/22/24 at  9:30 AM EST by a video enabled telemedicine application and verified that I am speaking with the correct person using two identifiers.  Location: Patient: home Provider: office   I discussed the limitations of evaluation and management by telemedicine and the availability of in person appointments. The patient expressed understanding and agreed to proceed.    Follow Up Instructions:    I discussed the assessment and treatment plan with the patient. The patient was provided an opportunity to ask questions and all were answered. The patient agreed with the plan and demonstrated an understanding of the instructions.   The patient was advised to call back or seek an in-person evaluation if the symptoms worsen or if the condition fails to improve as anticipated.  I provided 10 minutes of non-face-to-face time during this encounter.   Anson Fret, MD   CC:  Migraines  01/22/2024: Doing well, refill medications.she has 4 migraine days a month and < 10 total headaches doing well on management  History of present illness: July 23, 2023.  Patient is here for migraines.has Moderate persistent asthma; Migraine with aura and without status migrainosus, not intractable; Obesity (BMI 30-39.9); Depression; Insulin resistance; Cervical intraepithelial neoplasia grade 1; Transaminitis; Hyperlipidemia, mixed; Other insomnia; Attention deficit hyperactivity disorder (ADHD); and Vitamin D deficiency on their problem list.   She was on depo and changed to a progesterone only pill and is going back to depo just started back second shot. She had migraines non stop when she didn;t have the depo and had extended periods and migraines. Greggory Keen has hekped her lose 22 pounds. In  February she had a severe migraines that lasted weeks and went to pcp and got a migraine cocktail at her pcp, it was so severe her family came to help her. She was prescribed Bernita Raisin but she doesn' see that it works well. She needs something that works immediately, also associated with longer periods and hormones, we discussed preventative and patient would like to hold off now until weight loss. Will discuss qulipta, ajovy, emgality, vyepti, nurtec can take every other day for prevention  Review of my records I the past:  We saw patient in the past last was in February 2021.  She started having migraines in high school and worsened as a freshman in college, she [facility] and tried multiple medications, managing her menstruation helped, at 1 point she had a migraine that lasted 9 days, she vomits, her migraines can be 10 out of 10 pain, no auras, migraines start in the left side, throbbing, pulsating, light sound smell sensitivity, nausea, vomiting dark room makes it better movement makes it worse her whole belly hurts.  Extensive family history in mother and grandmother.  Her migraines have been exacerbated by her birth control method.  At the time she was having daily migraines she was working with OB we discussed stroke in patients with migraine with aura, she had gained lots of weight in November 2019 so we started Topamax as a preventative and sent her to a weight loss program with Orlene Plum.  I gave her samples of Aimovig for several weeks.  When we saw her in February 2021 she felt her migraines are well-controlled, managing her birth control method, she was taken off  the Depo shot..  She has migraines with aura and OB/GYN is aware of restrictions.   I referred her to [facility] to help manage her depression and ADHD and other mood disorders and at that time she had a dull headaches a month to mild, 2 migraine days a month treated acutely, and having frequent  auras.  Nausea had been really bad so we tried Phenergan as Zofran was not working.  Maxalt was working we continue that for her 2 migraines a month, nausea was not well-controlled prescribed Phenergan, we decided to treat her acutely and had started going to see Dr[facility] Lenord Fellers [facility].  The last time we saw her we prescribed Phenergan, Maxalt and Zofran. She has used sumatriptan oral and nasal in the past. She has migraines 6 migraine days a month but les than 10 total headache days a month. Rizatriptan stopped being as effective.   From a thorough review of records, medications tried that can be used in migraine prevention include: Propranolol, topiramate, nortriptyline, Maxalt, sumatriptan, Lexapro, rizatriptan, Wellbutrin, Aimovig, and Cambia, Frova, Zofran, prednisone, amitriptyline,topiramate, nortriptyline.  She also tried Phenergan, Zofran was not working as well, Maxalt was working well for her migraines we were treating acutely last time we saw her, and she was seeing Dr. Dalbert Garnet [facility], also tried Zofran which did not work so we prescribed Phenergan, ubrelvy  Patient complains of symptoms per HPI as well as the following symptoms: none . Pertinent negatives and positives per HPI. All others negative  Reviewed notes, labs and imaging from outside physicians, which showed:   Interval 01/12/2020: She feels her migraines are well controlled. She has been trying to manage her birth control, she was taken off of the depo shot. She has migraine with aura and obgyn is aware of restrictions there as is patient. Meds tried include topamax, lexapro, maxalt, rizatriptan, wellbutrin, aimovig, cambia, frova, zofran, prednisone, propranolol, nortriptyline. She is having difficulty with her ADD and she was on aderal in the past, she also has anxiety I feel she would benefit from someone professionally manage this will refer to Ellis [facility].   She feels her add trying to concerntrate is a trigger. 8 dull headaches a month which are mild, 2 migraine days a month which are treated acutely. She always has an aura. Maxalt does hekp when she has the headaches/migraines and the dull headaches are not bad. The nausea has been the worst, we can try phenergan as zofran not helping.   Interval history 10/14/2018: changed BC method and since then daily headaches. She was on depo q26months and did well but had to stop it because of the bone density fear. It'll take time on the new birth control medication, she is having migraines daily.  We spent an extended amount of time discussing birth control and migraines.  She was just changed to low-dose estrogen, distressed stroke risks in patients with migraine aura.  Medication she is on is very similar to NuvaRing, I do like NuvaRing in the management of migraine headaches as it appears to keep the estrogen levels very stable which is important and migraines.  At this point I do agree with her gynecologist at that it just may take several months to become adjusted to the new medications.  Discussed with patient why she wanted to do in the meantime, she is gained lots of weight and so was in favor of starting Topamax as  a preventative but I discussed she probably needs a weight loss program so we will refer her to the [facility] with Dr. Orlene Plum.  We can also give her samples of Aimovig for several months.  Asked her to email me in 6 to 8 weeks and if she is feeling better she may go back to her prior baseline and not need preventatives.  HPI:  Miranda Hunter is a 38 y.o. female here as a referral from Dr. Bufford Buttner for migraines. Mother is here and provides information. She started having migraines in HS and then worsened as a freshman in college. [facility] and tried multiple medications. No known triggers. Managing her menstruation helped. In October her  migraine lasted 9 days. She was vomiting, was so painful, 10/10 pain and this was her tipping point. She has had no auras but also has an aura, starts on the left side, throbbing, pulsating, light/sound/smell sensitivity, dark room better, movement makes it worse, her whole body hurts. Extensive family history of migraines in mother, grandmother. She has numbness in the toes and tongue, no weakness. Every 3 months she gets a migraine and it is predictable. No other focal neurologic deficits, associated symptoms, inciting events or modifiable factors.  Reviewed notes, labs and imaging from outside physicians, which showed:  Reviewed referring physician's notes, patient was last seen in October 2018 and she had been dealing with a migraine for 8 days, 3-4 clusters of migraines throughout the day, in the past she typically would get one headache per month prior to.,  Not sexually active, Imitrex did not work and gave her side effects, butalbital makes her very moody and temperamental, has never been on a preventative medication.  In the past she worked on diet elimination but this was not helpful, headaches would typically come and go around her.  Periods started at the age of 22, worsened as a freshman in college.  She went to the [facility] did biofeedback without relief.  Did not do acupuncture.  Her OB/GYN put her on Depo-Provera which has helped for the last 3 years.  Tried Nexplanon for 2 years but discontinued due to continuous headache and bleeding.  Never had imaging of her brain.  TSH, CMP, HIV, CBC all unremarkable only abnormality with slightly elevated white blood cells 11.7.  And elevated platelets 445.  Review of Systems: Patient complains of symptoms per HPI as well as the following symptoms: difficulty with concentration, ADD. Pertinent negatives and positives per HPI. All others negative.   Social History   Socioeconomic History   Marital status: Single    Spouse name:  Not on file   Number of children: 0   Years of education: Not on file   Highest education level: Bachelor's degree (e.g., BA, AB, BS)  Occupational History   Not on file  Tobacco Use   Smoking status: Never   Smokeless tobacco: Never  Vaping Use   Vaping status: Never Used  Substance and Sexual Activity   Alcohol use: Yes    Comment: socially   Drug use: No   Sexual activity: Not Currently    Comment: kyleena  Other Topics Concern   Not on file  Social History Narrative   She is a Corporate investment banker   "[name]"   Recently got a new puppy- a golden retriever named [name]   Caffeine once a week      Social Drivers of [employer]  Strain: Not on file  Food Insecurity: Not on file  Transportation Needs: Not on file  Physical Activity: Not on file  Stress: Not on file  Social Connections: Not on file  Intimate Partner Violence: Not on file    Family History  Problem Relation Age of Onset   Migraines Mother    Prostate cancer Father    Hypertension Father    Asthma Sister    Lung cancer Maternal Grandmother        heavy smoker   Migraines Maternal Grandmother    Lung cancer Maternal Grandfather        heavy smoker   Asthma Sister     Past Medical History:  Diagnosis Date   Anxiety    Asthma    History of chicken pox    IBS (irritable bowel syndrome)    Migraines     Past Surgical History:  Procedure Laterality Date   tonsillectomy and adnoidectomy Bilateral 2007    Current Outpatient Medications  Medication Sig Dispense Refill   albuterol (VENTOLIN HFA) 108 (90 Base) MCG/ACT inhaler Inhale 2 puffs into the lungs every 6 (six) hours as needed for wheezing or shortness of breath. 18 g 2   amphetamine-dextroamphetamine (ADDERALL) 10 MG tablet Take 1 tablet (10 mg total) by mouth daily. 30 tablet 0   amphetamine-dextroamphetamine (ADDERALL) 5 MG tablet Take 1 tablet (5 mg total) by mouth daily. This is in addition to 10mg  . Take 10mg  in the am and this 5mg   at noon 30 tablet 0   cetirizine (ZYRTEC) 10 MG tablet Take 10 mg by mouth daily.     chlorpheniramine-HYDROcodone (TUSSIONEX) 10-8 MG/5ML Take 5 mLs by mouth every 12 (twelve) hours as needed for cough. 120 mL 0   doxycycline (VIBRA-TABS) 100 MG tablet Take 1 tablet (100 mg total) by mouth 2 (two) times daily. 20 tablet 0   escitalopram (LEXAPRO) 20 MG tablet TAKE 1 TABLET BY MOUTH EVERY DAY 30 tablet 2   fluticasone (FLONASE) 50 MCG/ACT nasal spray      medroxyPROGESTERone Acetate 150 MG/ML SUSY Inject 1 mL every 3 months by intramuscular route.     Multiple Vitamin (MULTIVITAMIN) tablet Take 1 tablet by mouth daily.     ondansetron (ZOFRAN-ODT) 4 MG disintegrating tablet TAKE 1-2 TABLETS BY MOUTH EVERY 8 HOURS AS NEEDED FOR NAUSEA. 30 tablet 1   promethazine (PHENERGAN) 25 MG tablet Take 0.5 tablets (12.5 mg total) by mouth every 8 (eight) hours as needed for nausea or vomiting. 20 tablet 11   Saline (SIMPLY SALINE) 0.9 % AERS Place 2 each into the nose as directed. Use nightly for sinus hygiene long-term.  Can also be used as many times daily as desired to assist with clearing congested sinuses. 127 mL 11   SUMAtriptan 6 MG/0.5ML SOAJ Inject 6 mg into the skin every 2 (two) hours. 3 mL 11   tirzepatide (MOUNJARO) 5 MG/0.5ML Pen Inject 7.5 mg into the skin once a week.     traZODone (DESYREL) 150 MG tablet Take 1 tablet (150 mg total) by mouth at bedtime. 30 tablet 2   Ubrogepant (UBRELVY) 50 MG TABS Take 1 tablet daily as needed for migraine. May take a second tablet after two hours if needed. Please use copay card: BIN [id number] PCN [id number] GRP [id number] ID [id number] EXP: 11/25/2024 12 tablet 0   No current facility-administered medications for this visit.    Allergies as of 01/20/2024 - Review Complete 11/12/2023  Allergen Reaction Noted  Amoxicillin-pot clavulanate Diarrhea 12/04/2016    Vitals: There were no vitals taken for this visit. Last Weight:  Wt Readings from Last 1  Encounters:  11/11/23 248 lb (112.5 kg)   Last Height:   Ht Readings from Last 1 Encounters:  11/11/23 5\' 9"  (1.753 m)   Physical exam: Exam: Gen: NAD, conversant      CV: No palpitations or chest pain or SOB. VS: Breathing at a normal rate.  Not febrile. Eyes: Conjunctivae clear without exudates or hemorrhage  Neuro: Detailed Neurologic Exam  Speech:    Speech is normal; fluent and spontaneous with normal comprehension.  Cognition:    The patient is oriented to person, place, and time;     recent and remote memory intact;     language fluent;     normal attention, concentration, fund of knowledge Cranial Nerves:    The pupils are equal, round, and reactive to light. Visual fields are full Extraocular movements are intact.  The face is symmetric with normal sensation. The palate elevates in the midline. Hearing intact. Voice is normal. Shoulder shrug is normal. The tongue has normal motion without fasciculations.   Coordination: normal  Gait:    No abnormalities noted or reported  Motor Observation:   no involuntary movements noted. Tone:    Appears normal  Posture:    Posture is normal. normal erect    Strength:    Strength is anti-gravity and symmetric in the upper and lower limbs.      Sensation: intact to LT, no reports of numbness or tingling or paresthesias         Assessment/Plan: This is a very nice 38 year old patient with migraine with aura.    Doing well, refill meds   Meds ordered this encounter  Medications   promethazine (PHENERGAN) 25 MG tablet    Sig: Take 0.5 tablets (12.5 mg total) by mouth every 8 (eight) hours as needed for nausea or vomiting.    Dispense:  20 tablet    Refill:  11   SUMAtriptan 6 MG/0.5ML SOAJ    Sig: Inject 6 mg into the skin every 2 (two) hours.    Dispense:  3 mL    Refill:  11   Ubrogepant (UBRELVY) 50 MG TABS    Sig: Take 1 tablet daily as needed for migraine. May take a second tablet after two hours if  needed. Please use copay card: BIN [id number] PCN [id number] GRP [id number] ID [id number] EXP: 11/25/2024    Dispense:  12 tablet    Refill:  0    Please use copay card: BIN [id number] PCN CNRX GRP [id number] ID [id number] EXP: 11/25/2024   Discussed:  There is increased risk for stroke in women with migraine with aura and a  Contraindication for the combined contraceptive pill for use by women who have migraine with aura, which is in line with World Health Organisation recommendations. The risk for women with migraine without aura is lower and other risk factors like smoking are far more likely to increase stroke risk than migraine. There is a recommendation for no smoking and for the use of low estrogen or progestogen only pills particularly for women with migraine with aura. It is important however that women with migraine who are taking the pill do not decide to suddenly stop taking it without discussing this with their doctor. Please discuss with her OB/GYN.  Discussed: To prevent or relieve headaches, try the following: Cool Compress. Lie down and place a cool compress  on your head.  Avoid headache triggers. If certain foods or odors seem to have triggered your migraines in the past, avoid them. A headache diary might help you identify triggers.  Include physical activity in your daily routine. Try a daily walk or other moderate aerobic exercise.  Manage stress. Find healthy ways to cope with the stressors, such as delegating tasks on your to-do list.  Practice relaxation techniques. Try deep breathing, yoga, massage and visualization.  Eat regularly. Eating regularly scheduled meals and maintaining a healthy diet might help prevent headaches. Also, drink plenty of fluids.  Follow a regular sleep schedule. Sleep deprivation might contribute to headaches Consider biofeedback. With this mind-body technique, you learn to control certain bodily functions -- such as muscle tension, heart rate and blood pressure  -- to prevent headaches or reduce headache pain.    Proceed to emergency room if you experience new or worsening symptoms or symptoms do not resolve, if you have new neurologic symptoms or if headache is severe, or for any concerning symptom.   Provided education and documentation from American headache Society toolbox including articles on: chronic migraine medication overuse headache, chronic migraines, prevention of migraines, behavioral and other nonpharmacologic treatments for headache.  Cc: Jarold Motto, PA, Ellis Savage PA, Quillian Quince MD  Naomie Dean, MD  [facility] [address] [place], [place] [address]  Phone [phone] Fax [phone]

## 2024-01-22 NOTE — Telephone Encounter (Signed)
LVM asking pt to call back to schedule follow up 

## 2024-01-22 NOTE — Telephone Encounter (Signed)
 One year follow up with NP for med check and refill thanks

## 2024-01-31 ENCOUNTER — Telehealth (HOSPITAL_COMMUNITY): Payer: Self-pay | Admitting: *Deleted

## 2024-01-31 MED ORDER — AMPHETAMINE-DEXTROAMPHETAMINE 10 MG PO TABS: 10.0000 mg | ORAL_TABLET | Freq: Every day | ORAL | 0 refills | Status: DC

## 2024-01-31 MED ORDER — AMPHETAMINE-DEXTROAMPHETAMINE 5 MG PO TABS: 5.0000 mg | ORAL_TABLET | Freq: Every day | ORAL | 0 refills | Status: DC

## 2024-01-31 NOTE — Addendum Note (Signed)
 Addended by: [name] on: 01/31/2024 10:15 AM   Modules accepted: Orders

## 2024-01-31 NOTE — Telephone Encounter (Signed)
 Patient Refill Request [id number] - [place], [place] - [address]   amphetamine-dextroamphetamine (ADDERALL) 5 MG tablet   amphetamine-dextroamphetamine (ADDERALL) 10 MG    Next Appt 03/09/24 Last Appt  01/20/24

## 2024-02-01 ENCOUNTER — Other Ambulatory Visit: Payer: Self-pay | Admitting: Internal Medicine

## 2024-02-01 DIAGNOSIS — G4709 Other insomnia: Secondary | ICD-10-CM

## 2024-02-25 ENCOUNTER — Other Ambulatory Visit: Payer: Self-pay | Admitting: Internal Medicine

## 2024-02-25 DIAGNOSIS — G4709 Other insomnia: Secondary | ICD-10-CM

## 2024-02-27 ENCOUNTER — Telehealth (HOSPITAL_COMMUNITY): Payer: Self-pay

## 2024-02-27 MED ORDER — AMPHETAMINE-DEXTROAMPHETAMINE 10 MG PO TABS: 10.0000 mg | ORAL_TABLET | Freq: Every day | ORAL | 0 refills | Status: DC

## 2024-02-27 MED ORDER — AMPHETAMINE-DEXTROAMPHETAMINE 5 MG PO TABS: 5.0000 mg | ORAL_TABLET | Freq: Every day | ORAL | 0 refills | Status: DC

## 2024-02-27 NOTE — Telephone Encounter (Signed)
 Medication refill - Call from patient requesting a new Adderall 5 mg and 10 mg order be sent into her [facility] on [address], [place]. Both last ordered 01/31/24 and patient returns next on 03/18/24.

## 2024-03-03 ENCOUNTER — Other Ambulatory Visit: Payer: Self-pay | Admitting: *Deleted

## 2024-03-03 DIAGNOSIS — F3289 Other specified depressive episodes: Secondary | ICD-10-CM

## 2024-03-03 MED ORDER — ESCITALOPRAM OXALATE 20 MG PO TABS: 20.0000 mg | ORAL_TABLET | Freq: Every day | ORAL | 2 refills | Status: DC

## 2024-03-09 ENCOUNTER — Telehealth (HOSPITAL_COMMUNITY): Payer: 59 | Admitting: Psychiatry

## 2024-03-18 ENCOUNTER — Telehealth (INDEPENDENT_AMBULATORY_CARE_PROVIDER_SITE_OTHER): Admitting: Psychiatry

## 2024-03-18 ENCOUNTER — Encounter (HOSPITAL_COMMUNITY): Payer: Self-pay | Admitting: Psychiatry

## 2024-03-18 DIAGNOSIS — F5102 Adjustment insomnia: Secondary | ICD-10-CM | POA: Diagnosis not present

## 2024-03-18 DIAGNOSIS — F900 Attention-deficit hyperactivity disorder, predominantly inattentive type: Secondary | ICD-10-CM | POA: Diagnosis not present

## 2024-03-18 DIAGNOSIS — F411 Generalized anxiety disorder: Secondary | ICD-10-CM

## 2024-03-18 NOTE — Progress Notes (Signed)
 [facility] Follow up visit  Patient Identification: Miranda Hunter MRN:  [id number] Date of Evaluation:  03/18/2024  Chief complaint: follow up adhd Referral Source: primary care Visit Diagnosis:    ICD-10-CM   1. GAD (generalized anxiety disorder)  F41.1     2. Adjustment insomnia  F51.02     3. Attention deficit hyperactivity disorder (ADHD), predominantly inattentive type  F90.0     Virtual Visit via Video Note  I connected with France Heringer on 03/18/24 at  9:30 AM EDT by a video enabled telemedicine application and verified that I am speaking with the correct person using two identifiers.  Location: Patient: home Provider: home office   I discussed the limitations of evaluation and management by telemedicine and the availability of in person appointments. The patient expressed understanding and agreed to proceed.    I discussed the assessment and treatment plan with the patient. The patient was provided an opportunity to ask questions and all were answered. The patient agreed with the plan and demonstrated an understanding of the instructions.   The patient was advised to call back or seek an in-person evaluation if the symptoms worsen or if the condition fails to improve as anticipated.  I provided 16 minutes of non-face-to-face time during this encounter.          History of Present Illness: 38 years old single White female referred by PCP for management of inattention.  Patient doing fairly well in regard to anxiety, attention  Tolerating meds and has lost weight follows with PCP Denies headaches, insomnia, palpitations  Understands the risk of adderall and monitor BP, pulse   Aggravating factors;  Grandparent death Modifying factors; her dog, job , family   Duration ADHD since young age;  Severity stable        Past Psychiatric History: anxiety, inattention  Previous Psychotropic Medications: Yes   Substance Abuse History in the last 12 months:   No.  Consequences of Substance Abuse: NA  Past Medical History:  Past Medical History:  Diagnosis Date   Anxiety    Asthma    History of chicken pox    IBS (irritable bowel syndrome)    Migraines     Past Surgical History:  Procedure Laterality Date   tonsillectomy and adnoidectomy Bilateral 2007    Family Psychiatric History: denies  Family History:  Family History  Problem Relation Age of Onset   Migraines Mother    Prostate cancer Father    Hypertension Father    Asthma Sister    Lung cancer Maternal Grandmother        heavy smoker   Migraines Maternal Grandmother    Lung cancer Maternal Grandfather        heavy smoker   Asthma Sister     Social History:   Social History   Socioeconomic History   Marital status: Single    Spouse name: Not on file   Number of children: 0   Years of education: Not on file   Highest education level: Bachelor's degree (e.g., BA, AB, BS)  Occupational History   Not on file  Tobacco Use   Smoking status: Never   Smokeless tobacco: Never  Vaping Use   Vaping status: Never Used  Substance and Sexual Activity   Alcohol use: Yes    Comment: socially   Drug use: No   Sexual activity: Not Currently    Comment: kyleena  Other Topics Concern   Not on file  Social History Narrative  She is a Psychiatric nurseSam"   Recently got a new puppy- a golden retriever named [name]   Caffeine once a week      Social Drivers of Corporate investment banker Strain: Not on [employer] Insecurity: Not on file  Transportation Needs: Not on file  Physical Activity: Not on file  Stress: Not on file  Social Connections: Not on file     Allergies:   Allergies  Allergen Reactions   Amoxicillin-Pot Clavulanate Diarrhea    Metabolic Disorder Labs: Lab Results  Component Value Date   HGBA1C 5.8 (H) 07/23/2023   No results found for: "PROLACTIN" Lab Results  Component Value Date   CHOL 189 07/23/2023   TRIG 271 (H) 07/23/2023   HDL  36 (L) 07/23/2023   CHOLHDL 5.3 (H) 07/23/2023   VLDL 69.4 (H) 11/12/2022   LDLCALC 106 (H) 07/23/2023   LDLCALC 129 (H) 08/24/2021   Lab Results  Component Value Date   TSH 2.140 07/23/2023    Therapeutic Level Labs: No results found for: "LITHIUM" No results found for: "CBMZ" No results found for: "VALPROATE"  Current Medications: Current Outpatient Medications  Medication Sig Dispense Refill   albuterol  (VENTOLIN  HFA) 108 (90 Base) MCG/ACT inhaler Inhale 2 puffs into the lungs every 6 (six) hours as needed for wheezing or shortness of breath. 18 g 2   amphetamine -dextroamphetamine  (ADDERALL) 10 MG tablet Take 1 tablet (10 mg total) by mouth daily. 30 tablet 0   amphetamine -dextroamphetamine  (ADDERALL) 5 MG tablet Take 1 tablet (5 mg total) by mouth daily. This is in addition to 10mg  . Take 10mg  in the am and this 5mg  at noon 30 tablet 0   cetirizine (ZYRTEC) 10 MG tablet Take 10 mg by mouth daily.     chlorpheniramine-HYDROcodone (TUSSIONEX) 10-8 MG/5ML Take 5 mLs by mouth every 12 (twelve) hours as needed for cough. 120 mL 0   doxycycline  (VIBRA -TABS) 100 MG tablet Take 1 tablet (100 mg total) by mouth 2 (two) times daily. 20 tablet 0   escitalopram  (LEXAPRO ) 20 MG tablet Take 1 tablet (20 mg total) by mouth daily. 30 tablet 2   fluticasone (FLONASE) 50 MCG/ACT nasal spray      medroxyPROGESTERone Acetate 150 MG/ML SUSY Inject 1 mL every 3 months by intramuscular route.     Multiple Vitamin (MULTIVITAMIN) tablet Take 1 tablet by mouth daily.     ondansetron  (ZOFRAN -ODT) 4 MG disintegrating tablet TAKE 1-2 TABLETS BY MOUTH EVERY 8 HOURS AS NEEDED FOR NAUSEA. 30 tablet 1   promethazine  (PHENERGAN ) 25 MG tablet Take 0.5 tablets (12.5 mg total) by mouth every 8 (eight) hours as needed for nausea or vomiting. 20 tablet 11   Saline (SIMPLY SALINE) 0.9 % AERS Place 2 each into the nose as directed. Use nightly for sinus hygiene long-term.  Can also be used as many times daily as desired  to assist with clearing congested sinuses. 127 mL 11   SUMAtriptan  6 MG/0.5ML SOAJ Inject 6 mg into the skin every 2 (two) hours. 3 mL 11   tirzepatide  (MOUNJARO) 5 MG/0.5ML Pen Inject 7.5 mg into the skin once a week.     traZODone  (DESYREL ) 150 MG tablet TAKE 1 TABLET BY MOUTH AT BEDTIME. 90 tablet 1   Ubrogepant  (UBRELVY ) 50 MG TABS Take 1 tablet daily as needed for migraine. May take a second tablet after two hours if needed. Please use copay card: BIN [id number] PCN [id number] GRP [id number] ID [id number] EXP: 11/25/2024 12  tablet 0   No current facility-administered medications for this visit.      Psychiatric Specialty Exam: Review of Systems  Cardiovascular:  Negative for chest pain.  Neurological:  Negative for weakness.  Psychiatric/Behavioral:  Negative for agitation and decreased concentration.     There were no vitals taken for this visit.There is no height or weight on file to calculate BMI.  General Appearance: Casual  Eye Contact:  Fair  Speech:  Slow  Volume:  Normal  Mood:fair  Affect:  Congruent  Thought Process:  Goal Directed  Orientation:  Full (Time, Place, and Person)  Thought Content:  Logical  Suicidal Thoughts:  No  Homicidal Thoughts:  No  Memory:  Immediate;   Fair Recent;   Fair  Judgement:  Fair  Insight:  Fair  Psychomotor Activity:  Normal  Concentration:  Concentration: Fair and Attention Span: variable  Recall:  Good  Fund of Knowledge:Good  Language: Good  Akathisia:  No  Handed:   AIMS (if indicated):  not done  Assets:  Desire for Improvement Housing  ADL's:  Intact  Cognition: WNL  Sleep:  Fair   Screenings: PHQ2-9    Flowsheet Row Video Visit from 08/13/2022 in [facility] from 12/03/2018 in [facility] Visit from 09/03/2017 in [facility]  PHQ-2 Total Score 0 5 0  PHQ-9 Total Score -- 13 --       Flowsheet Row Video Visit from 06/22/2022 in [facility] at [facility] Video Visit from 02/16/2022 in [facility] at [facility] Video Visit from 11/10/2021 in [facility] at [facility]  C-SSRS RISK CATEGORY No Risk No Risk No Risk       Assessment and Plan: as follows  Prior documentation reviewed    ADHD : manageable continue adderall, take drug holidays, refill when due Generalized anxiety disorder; manageable, has activiites to keep busy and good work life balance, continue lexapro  gets from PCP  Insomnia: on trazadone now prn , helps when needed, can continue  Fu 1m.   Wray Heady, MD 4/23/20259:25 AM

## 2024-03-26 ENCOUNTER — Telehealth (HOSPITAL_COMMUNITY): Payer: Self-pay | Admitting: *Deleted

## 2024-03-26 ENCOUNTER — Other Ambulatory Visit: Payer: Self-pay | Admitting: Physician Assistant

## 2024-03-26 DIAGNOSIS — F3289 Other specified depressive episodes: Secondary | ICD-10-CM

## 2024-03-26 MED ORDER — AMPHETAMINE-DEXTROAMPHETAMINE 10 MG PO TABS
10.0000 mg | ORAL_TABLET | Freq: Every day | ORAL | 0 refills | Status: DC
Start: 2024-03-26 — End: 2024-04-28

## 2024-03-26 MED ORDER — AMPHETAMINE-DEXTROAMPHETAMINE 5 MG PO TABS: 5.0000 mg | ORAL_TABLET | Freq: Every day | ORAL | 0 refills | Status: DC

## 2024-03-26 NOTE — Telephone Encounter (Signed)
 Patient Request amphetamine -dextroamphetamine  (ADDERALL) 10 MG tablet   amphetamine -dextroamphetamine  (ADDERALL) 5 MG tablet  [id number] [place], [place] - [address]   Next Appt   09/16/24.  Last Appt   03/18/24

## 2024-03-26 NOTE — Addendum Note (Signed)
 Addended by: Wray Heady on: 03/26/2024 10:17 AM   Modules accepted: Orders

## 2024-04-16 ENCOUNTER — Encounter: Payer: Self-pay | Admitting: Neurology

## 2024-04-16 ENCOUNTER — Other Ambulatory Visit: Payer: Self-pay | Admitting: Physician Assistant

## 2024-04-28 ENCOUNTER — Telehealth (HOSPITAL_COMMUNITY): Payer: Self-pay | Admitting: *Deleted

## 2024-04-28 MED ORDER — AMPHETAMINE-DEXTROAMPHETAMINE 10 MG PO TABS: 10.0000 mg | ORAL_TABLET | Freq: Every day | ORAL | 0 refills | Status: DC

## 2024-04-28 MED ORDER — AMPHETAMINE-DEXTROAMPHETAMINE 5 MG PO TABS: 5.0000 mg | ORAL_TABLET | Freq: Every day | ORAL | 0 refills | Status: DC

## 2024-04-28 NOTE — Addendum Note (Signed)
 Addended by: Eluzer Howdeshell on: 04/28/2024 10:24 AM   Modules accepted: Orders

## 2024-04-28 NOTE — Telephone Encounter (Signed)
 Patient  [id number] [place], [place] - [address]   amphetamine -dextroamphetamine  (ADDERALL) 5 MG tablet   amphetamine -dextroamphetamine  (ADDERALL) 10 MG tablet    Next Appt   09/16/24.  Last Appt   03/18/24

## 2024-05-21 ENCOUNTER — Telehealth (HOSPITAL_COMMUNITY): Payer: Self-pay | Admitting: *Deleted

## 2024-05-21 MED ORDER — AMPHETAMINE-DEXTROAMPHETAMINE 10 MG PO TABS: 10.0000 mg | ORAL_TABLET | Freq: Every day | ORAL | 0 refills | Status: DC

## 2024-05-21 MED ORDER — AMPHETAMINE-DEXTROAMPHETAMINE 5 MG PO TABS: 5.0000 mg | ORAL_TABLET | Freq: Every day | ORAL | 0 refills | Status: DC

## 2024-05-21 NOTE — Addendum Note (Signed)
 Addended by: GERALENE KAISER on: 05/21/2024 03:39 PM   Modules accepted: Orders

## 2024-05-21 NOTE — Telephone Encounter (Signed)
 Patient  [id number] [place], [place] - [address]  amphetamine -dextroamphetamine  (ADDERALL) 5 MG tablet    amphetamine -dextroamphetamine  (ADDERALL) 10 MG tablet    Next Appt   09/16/24.  Last Appt   03/18/24

## 2024-06-18 ENCOUNTER — Telehealth (HOSPITAL_COMMUNITY): Payer: Self-pay | Admitting: Psychiatry

## 2024-06-18 NOTE — Telephone Encounter (Signed)
 Patient called requesting refills of:   amphetamine -dextroamphetamine  (ADDERALL) 10 MG tablet Last ordered: 05/21/2024 - 30 tablets  amphetamine -dextroamphetamine  (ADDERALL) 5 MG tablet Last ordered: 05/21/2024 - 30 tablets   [id number] [place], [place] - [address] Phone: [phone]  Fax: [phone]     Last visit: 03/18/2024 Next visit: 09/16/2024

## 2024-06-19 ENCOUNTER — Telehealth: Payer: Self-pay

## 2024-06-19 MED ORDER — AMPHETAMINE-DEXTROAMPHETAMINE 5 MG PO TABS: 5.0000 mg | ORAL_TABLET | Freq: Every day | ORAL | 0 refills | Status: DC

## 2024-06-19 MED ORDER — AMPHETAMINE-DEXTROAMPHETAMINE 10 MG PO TABS: 10.0000 mg | ORAL_TABLET | Freq: Every day | ORAL | 0 refills | Status: DC

## 2024-06-19 NOTE — Telephone Encounter (Signed)
 pt called states that the refills request from yesterday was sent to the wrong pharmacy [facility] [address]. pt was last seen on 04-23 next appt 10-22 (looks like it was for the adderalls 5mg  and 10mg )

## 2024-06-19 NOTE — Telephone Encounter (Signed)
 [facility] [address] [phone] and canceled rxs for adderall that was sent over. spoke with Kaden. please [facility] [address]

## 2024-06-19 NOTE — Telephone Encounter (Signed)
 pt notified that rxs have been sent to the correct pharmacy

## 2024-06-23 ENCOUNTER — Other Ambulatory Visit (HOSPITAL_BASED_OUTPATIENT_CLINIC_OR_DEPARTMENT_OTHER): Payer: Self-pay | Admitting: Obstetrics and Gynecology

## 2024-06-23 DIAGNOSIS — Z9189 Other specified personal risk factors, not elsewhere classified: Secondary | ICD-10-CM

## 2024-07-12 ENCOUNTER — Other Ambulatory Visit: Payer: Self-pay | Admitting: Physician Assistant

## 2024-07-16 ENCOUNTER — Telehealth (HOSPITAL_COMMUNITY): Payer: Self-pay

## 2024-07-16 MED ORDER — AMPHETAMINE-DEXTROAMPHETAMINE 10 MG PO TABS: 10.0000 mg | ORAL_TABLET | Freq: Every day | ORAL | 0 refills | Status: DC

## 2024-07-16 MED ORDER — AMPHETAMINE-DEXTROAMPHETAMINE 5 MG PO TABS: 5.0000 mg | ORAL_TABLET | Freq: Every day | ORAL | 0 refills | Status: DC

## 2024-07-16 NOTE — Telephone Encounter (Signed)
 Medication refill - Patient left a message she is in need of a new Adderall 5 mg and Adderall 10 mg order, both last ordered on 06/19/24 and pt returns next on 09/16/24. [facility] [address]

## 2024-07-16 NOTE — Telephone Encounter (Signed)
 Medication management - Message left for patient that Dr. Laurann had sent in her requested 2 new orders of Adderall, 5 mg and 10 mg orders and to call back if any issues.

## 2024-07-19 ENCOUNTER — Other Ambulatory Visit: Payer: Self-pay | Admitting: Internal Medicine

## 2024-07-19 DIAGNOSIS — G4709 Other insomnia: Secondary | ICD-10-CM

## 2024-08-17 ENCOUNTER — Telehealth (HOSPITAL_COMMUNITY): Payer: Self-pay | Admitting: Psychiatry

## 2024-08-17 ENCOUNTER — Other Ambulatory Visit (HOSPITAL_COMMUNITY): Payer: Self-pay | Admitting: Psychiatry

## 2024-08-17 MED ORDER — AMPHETAMINE-DEXTROAMPHETAMINE 5 MG PO TABS: 5.0000 mg | ORAL_TABLET | Freq: Every day | ORAL | 0 refills | Status: DC

## 2024-08-17 MED ORDER — AMPHETAMINE-DEXTROAMPHETAMINE 10 MG PO TABS: 10.0000 mg | ORAL_TABLET | Freq: Every day | ORAL | 0 refills | Status: DC

## 2024-08-17 NOTE — Telephone Encounter (Signed)
 Patient called requesting refill of:  amphetamine -dextroamphetamine  (ADDERALL) 10 MG tablet  Last ordered: 07/16/2024 - 30 tablets  amphetamine -dextroamphetamine  (ADDERALL) 5 MG tablet  Last ordered: 07/16/2024 - 30 tablets  [id number] [place], [place] - [address] (Ph: [phone])   Last visit: 03/18/2024 Next visit: 09/16/2024

## 2024-08-17 NOTE — Telephone Encounter (Signed)
 sent

## 2024-09-13 ENCOUNTER — Other Ambulatory Visit: Payer: Self-pay | Admitting: Physician Assistant

## 2024-09-13 DIAGNOSIS — F3289 Other specified depressive episodes: Secondary | ICD-10-CM

## 2024-09-14 ENCOUNTER — Telehealth (HOSPITAL_COMMUNITY): Payer: Self-pay | Admitting: Psychiatry

## 2024-09-14 NOTE — Telephone Encounter (Signed)
 Patient called requesting refills of:  amphetamine -dextroamphetamine  (ADDERALL) 10 MG tablet  Last ordered: 08/17/2024 - 30 tablets  amphetamine -dextroamphetamine  (ADDERALL) 5 MG tablet  Last ordered: 08/17/2024 - 30 tablets   [id number] [place], [place] - [address] (Ph: [phone])   Last visit:  03/18/2024 Next visit: 09/16/2024

## 2024-09-15 ENCOUNTER — Telehealth (INDEPENDENT_AMBULATORY_CARE_PROVIDER_SITE_OTHER): Admitting: Psychiatry

## 2024-09-15 ENCOUNTER — Encounter (HOSPITAL_COMMUNITY): Payer: Self-pay | Admitting: Psychiatry

## 2024-09-15 DIAGNOSIS — F5102 Adjustment insomnia: Secondary | ICD-10-CM

## 2024-09-15 DIAGNOSIS — G4700 Insomnia, unspecified: Secondary | ICD-10-CM

## 2024-09-15 DIAGNOSIS — F411 Generalized anxiety disorder: Secondary | ICD-10-CM | POA: Diagnosis not present

## 2024-09-15 DIAGNOSIS — F900 Attention-deficit hyperactivity disorder, predominantly inattentive type: Secondary | ICD-10-CM | POA: Diagnosis not present

## 2024-09-15 MED ORDER — AMPHETAMINE-DEXTROAMPHETAMINE 5 MG PO TABS: 5.0000 mg | ORAL_TABLET | Freq: Every day | ORAL | 0 refills | Status: DC

## 2024-09-15 MED ORDER — AMPHETAMINE-DEXTROAMPHETAMINE 10 MG PO TABS: 10.0000 mg | ORAL_TABLET | Freq: Every day | ORAL | 0 refills | Status: DC

## 2024-09-15 NOTE — Progress Notes (Signed)
 [facility] Follow up visit  Patient Identification: Miranda Hunter MRN:  [id number] Date of Evaluation:  09/15/2024  Chief complaint: follow up adhd Referral Source: primary care Visit Diagnosis:    ICD-10-CM   1. GAD (generalized anxiety disorder)  F41.1     2. Adjustment insomnia  F51.02     3. Attention deficit hyperactivity disorder (ADHD), predominantly inattentive type  F90.0     Virtual Visit via Video Note  I connected with Miranda Hunter on 09/15/24 at  9:20 AM EDT by a video enabled telemedicine application and verified that I am speaking with the correct person using two identifiers.  Location: Patient: home Provider: office   I discussed the limitations of evaluation and management by telemedicine and the availability of in person appointments. The patient expressed understanding and agreed to proceed.     I discussed the assessment and treatment plan with the patient. The patient was provided an opportunity to ask questions and all were answered. The patient agreed with the plan and demonstrated an understanding of the instructions.   The patient was advised to call back or seek an in-person evaluation if the symptoms worsen or if the condition fails to improve as anticipated.  I provided 16 minutes of non-face-to-face time during this encounter.            History of Present Illness: 38 years old single White female referred by PCP for management of inattention.  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  Consequences of Substance Abuse: NA  Past Medical History:  Past Medical History:  Diagnosis Date   Anxiety    Asthma    History of chicken pox    IBS (irritable bowel syndrome)    Migraines     Past Surgical History:  Procedure Laterality Date   tonsillectomy and adnoidectomy Bilateral 2007    Family Psychiatric History: denies  Family History:  Family History  Problem Relation Age of Onset   Migraines Mother    Prostate cancer Father    Hypertension Father    Asthma Sister    Lung cancer Maternal Grandmother        heavy smoker   Migraines Maternal Grandmother    Lung cancer Maternal Grandfather        heavy smoker   Asthma Sister     Social History:   Social History   Socioeconomic History   Marital status: Single    Spouse name: Not on file   Number of children: 0   Years of education: Not on file   Highest education level: Bachelor's degree (e.g., BA, AB, BS)  Occupational History   Not on file  Tobacco Use   Smoking status: Never   Smokeless tobacco: Never  Vaping Use   Vaping status: Never Used  Substance and Sexual Activity   Alcohol use: Yes  Comment: socially   Drug use: No   Sexual activity: Not Currently    Comment: kyleena  Other Topics Concern   Not on file  Social History Narrative   She is a Corporate investment banker   [name]   Recently got a new puppy- a Advertising account planner named [name]   Caffeine once a week      Social Drivers of Corporate investment banker Strain: Not on file  Food Insecurity: Not on file  Transportation Needs: Not on file  Physical Activity: Not on file  Stress: Not on file  Social Connections: Not on file     Allergies:   Allergies  Allergen Reactions   Amoxicillin-Pot Clavulanate Diarrhea    Metabolic Disorder  Labs: Lab Results  Component Value Date   HGBA1C 5.8 (H) 07/23/2023   No results found for: PROLACTIN Lab Results  Component Value Date   CHOL 189 07/23/2023   TRIG 271 (H) 07/23/2023   HDL 36 (L) 07/23/2023   CHOLHDL 5.3 (H) 07/23/2023   VLDL 69.4 (H) 11/12/2022   LDLCALC 106 (H) 07/23/2023   LDLCALC 129 (H) 08/24/2021   Lab Results  Component Value Date   TSH 2.140 07/23/2023    Therapeutic Level Labs: No results found for: LITHIUM No results found for: CBMZ No results found for: VALPROATE  Current Medications: Current Outpatient Medications  Medication Sig Dispense Refill   albuterol  (VENTOLIN  HFA) 108 (90 Base) MCG/ACT inhaler Inhale 2 puffs into the lungs every 6 (six) hours as needed for wheezing or shortness of breath. 18 g 2   amphetamine -dextroamphetamine  (ADDERALL) 10 MG tablet Take 1 tablet (10 mg total) by mouth daily. 30 tablet 0   amphetamine -dextroamphetamine  (ADDERALL) 5 MG tablet Take 1 tablet (5 mg total) by mouth daily. This is in addition to 10mg  . Take 10mg  in the am and this 5mg  at noon 30 tablet 0   cetirizine (ZYRTEC) 10 MG tablet Take 10 mg by mouth daily.     chlorpheniramine-HYDROcodone (TUSSIONEX) 10-8 MG/5ML Take 5 mLs by mouth every 12 (twelve) hours as needed for cough. 120 mL 0   doxycycline  (VIBRA -TABS) 100 MG tablet Take 1 tablet (100 mg total) by mouth 2 (two) times daily. 20 tablet 0   escitalopram  (LEXAPRO ) 20 MG tablet TAKE 1 TABLET BY MOUTH EVERY DAY 30 tablet 1   fluticasone (FLONASE) 50 MCG/ACT nasal spray      medroxyPROGESTERone Acetate 150 MG/ML SUSY Inject 1 mL every 3 months by intramuscular route.     Multiple Vitamin (MULTIVITAMIN) tablet Take 1 tablet by mouth daily.     ondansetron  (ZOFRAN -ODT) 4 MG disintegrating tablet TAKE 1-2 TABLETS BY MOUTH EVERY 8 HOURS AS NEEDED FOR NAUSEA. 30 tablet 1   promethazine  (PHENERGAN ) 25 MG tablet Take 0.5 tablets (12.5 mg total) by mouth every 8 (eight) hours as needed for nausea or  vomiting. 20 tablet 11   Saline (SIMPLY SALINE) 0.9 % AERS Place 2 each into the nose as directed. Use nightly for sinus hygiene long-term.  Can also be used as many times daily as desired to assist with clearing congested sinuses. 127 mL 11   SUMAtriptan  6 MG/0.5ML SOAJ Inject 6 mg into the skin every 2 (two) hours. 3 mL 11   tirzepatide  (MOUNJARO) 5 MG/0.5ML Pen Inject 7.5 mg into the skin once a week.     traZODone  (DESYREL ) 150 MG tablet TAKE 1 TABLET BY MOUTH EVERYDAY AT BEDTIME 30 tablet 5   Ubrogepant  (UBRELVY ) 50 MG TABS Take  1 tablet daily as needed for migraine. May take a second tablet after two hours if needed. Please use copay card: BIN [id number] PCN [id number] GRP [id number] ID [id number] EXP: 11/25/2024 12 tablet 0   No current facility-administered medications for this visit.      Psychiatric Specialty Exam: Review of Systems  Cardiovascular:  Negative for chest pain.  Neurological:  Negative for weakness.  Psychiatric/Behavioral:  Negative for agitation and decreased concentration.     There were no vitals taken for this visit.There is no height or weight on file to calculate BMI.  General Appearance: Casual  Eye Contact:  Fair  Speech:  Slow  Volume:  Normal  Mood:fair  Affect:  Congruent  Thought Process:  Goal Directed  Orientation:  Full (Time, Place, and Person)  Thought Content:  Logical  Suicidal Thoughts:  No  Homicidal Thoughts:  No  Memory:  Immediate;   Fair Recent;   Fair  Judgement:  Fair  Insight:  Fair  Psychomotor Activity:  Normal  Concentration:  Concentration: Fair and Attention Span: variable  Recall:  Good  Fund of Knowledge:Good  Language: Good  Akathisia:  No  Handed:   AIMS (if indicated):  not done  Assets:  Desire for Improvement Housing  ADL's:  Intact  Cognition: WNL  Sleep:  Fair   Screenings: PHQ2-9    Flowsheet Row Video Visit from 08/13/2022 in [facility] from 12/03/2018 in  [facility] Visit from 09/03/2017 in [facility]  PHQ-2 Total Score 0 5 0  PHQ-9 Total Score -- 13 --   Flowsheet Row Video Visit from 06/22/2022 in [facility] at [facility] Video Visit from 02/16/2022 in [facility] at [facility] Video Visit from 11/10/2021 in [facility] at [facility]  C-SSRS RISK CATEGORY No Risk No Risk No Risk    Assessment and Plan: as follows  Prior documentation reviewed    ADHD : Manageable continue Adderall tablet 5 mg and take drug holidays monitor blood pressure and pulse  Generalized anxiety disorder; manageable with Lexapro  getting it from primary care physician  Insomnia: On trazodone  from primary care physician reviewed sleep hygiene  Follow-up in 6 months or earlier if needed reviewed medication questions addressed Jackey Flight, MD 10/21/20259:28 AM

## 2024-09-16 ENCOUNTER — Telehealth (HOSPITAL_COMMUNITY): Admitting: Psychiatry

## 2024-10-12 ENCOUNTER — Telehealth (HOSPITAL_COMMUNITY): Payer: Self-pay | Admitting: Psychiatry

## 2024-10-12 MED ORDER — AMPHETAMINE-DEXTROAMPHETAMINE 5 MG PO TABS: 5.0000 mg | ORAL_TABLET | Freq: Every day | ORAL | 0 refills | Status: DC

## 2024-10-12 MED ORDER — AMPHETAMINE-DEXTROAMPHETAMINE 10 MG PO TABS: 10.0000 mg | ORAL_TABLET | Freq: Every day | ORAL | 0 refills | Status: DC

## 2024-10-12 NOTE — Telephone Encounter (Signed)
 Patient called requesting refills of:   amphetamine -dextroamphetamine  (ADDERALL) 10 MG tablet     amphetamine -dextroamphetamine  (ADDERALL) 5 MG tablet      [id number] [place], [place] - [address] (Ph: [phone])    Last visit:  09/15/24 Next visit: 03/15/25

## 2024-10-14 ENCOUNTER — Other Ambulatory Visit: Payer: Self-pay | Admitting: Physician Assistant

## 2024-10-14 ENCOUNTER — Telehealth (HOSPITAL_COMMUNITY): Payer: Self-pay | Admitting: Psychiatry

## 2024-10-14 MED ORDER — AMPHETAMINE-DEXTROAMPHETAMINE 10 MG PO TABS: 10.0000 mg | ORAL_TABLET | Freq: Every day | ORAL | 0 refills | Status: DC

## 2024-10-14 MED ORDER — AMPHETAMINE-DEXTROAMPHETAMINE 5 MG PO TABS: 5.0000 mg | ORAL_TABLET | Freq: Every day | ORAL | 0 refills | Status: DC

## 2024-10-14 NOTE — Telephone Encounter (Signed)
 Patient called stating her pharmacy notified her that they were unable to fill the 10/12/2024 order for amphetamine -dextroamphetamine  (ADDERALL) 10 MG tablet  and amphetamine -dextroamphetamine  (ADDERALL) 5 MG tablet due to their pharmacist not being credentialed to dispensed controlled substances. Request new orders be sent to   [id number] [place], [place] - [address] Phone: [phone]  Fax: [phone]

## 2024-11-10 ENCOUNTER — Other Ambulatory Visit (HOSPITAL_COMMUNITY): Payer: Self-pay

## 2024-11-10 ENCOUNTER — Telehealth (HOSPITAL_COMMUNITY): Payer: Self-pay | Admitting: Psychiatry

## 2024-11-10 MED ORDER — AMPHETAMINE-DEXTROAMPHETAMINE 10 MG PO TABS: 10.0000 mg | ORAL_TABLET | Freq: Every day | ORAL | 0 refills | Status: DC

## 2024-11-10 MED ORDER — AMPHETAMINE-DEXTROAMPHETAMINE 5 MG PO TABS: 5.0000 mg | ORAL_TABLET | Freq: Every day | ORAL | 0 refills | Status: DC

## 2024-11-10 NOTE — Telephone Encounter (Signed)
 Patient called requesting refills of:  amphetamine -dextroamphetamine  (ADDERALL) 10 MG tablet   amphetamine -dextroamphetamine  (ADDERALL) 5 MG tablet    [id number] [place], [place] - [address] (Ph: [phone])   Last ordered: 10/14/2024 - 30 tablets each Last visit: 09/15/2024 Next visit: 03/15/2025

## 2024-11-10 NOTE — Telephone Encounter (Signed)
 Patient called stating refills ordered earlier today for amphetamine -dextroamphetamine  (ADDERALL) 10 MG tablet  and amphetamine -dextroamphetamine  (ADDERALL) 5 MG tablet were sent to a different pharmacy than requested. Requests that refills be redirected to:  [id number] [place], [place] - [address] Phone: [phone]  Fax: [phone]     Preferred pharmacy list reviewed and reordered according to patient preference.

## 2024-11-10 NOTE — Progress Notes (Signed)
 Refills sent

## 2024-11-10 NOTE — Telephone Encounter (Signed)
Refills sent to provider.

## 2024-11-10 NOTE — Telephone Encounter (Signed)
Pt aware refills sent 

## 2024-11-12 ENCOUNTER — Other Ambulatory Visit: Payer: Self-pay | Admitting: Physician Assistant

## 2024-11-12 DIAGNOSIS — F3289 Other specified depressive episodes: Secondary | ICD-10-CM

## 2024-12-06 ENCOUNTER — Other Ambulatory Visit: Payer: Self-pay | Admitting: Physician Assistant

## 2024-12-06 DIAGNOSIS — F3289 Other specified depressive episodes: Secondary | ICD-10-CM

## 2024-12-08 ENCOUNTER — Telehealth (HOSPITAL_COMMUNITY): Payer: Self-pay | Admitting: Psychiatry

## 2024-12-08 MED ORDER — AMPHETAMINE-DEXTROAMPHETAMINE 5 MG PO TABS: 5.0000 mg | ORAL_TABLET | Freq: Every day | ORAL | 0 refills | Status: AC

## 2024-12-08 NOTE — Telephone Encounter (Signed)
 Patient called stating needs refill for amphetamine -dextroamphetamine  (ADDERALL) 10 MG tablet  and amphetamine -dextroamphetamine  (ADDERALL) 5 MG tablet   [facility].   Next Visit - 4/20 Last Visit -10/21

## 2024-12-09 ENCOUNTER — Telehealth (HOSPITAL_COMMUNITY): Payer: Self-pay | Admitting: Psychiatry

## 2024-12-09 MED ORDER — AMPHETAMINE-DEXTROAMPHETAMINE 10 MG PO TABS: 10.0000 mg | ORAL_TABLET | Freq: Every day | ORAL | 0 refills | Status: AC

## 2024-12-09 NOTE — Telephone Encounter (Signed)
 Patient called stating only one of her requested refills recently ordered was received by pharmacy and requested refill of amphetamine -dextroamphetamine  (ADDERALL) 10 MG tablet .  [id number] [place], [place] - [address] (Ph: [phone])   Last ordered: 11/10/2024 - 30 tablets Last visit: 09/15/2024 Next visit: 03/15/2025

## 2025-03-15 ENCOUNTER — Telehealth (HOSPITAL_COMMUNITY): Admitting: Psychiatry
